# Patient Record
Sex: Male | Born: 1970
Health system: Southern US, Community
[De-identification: ages and names within clinical notes are randomized; demographics above are authoritative.]

## PROBLEM LIST (undated history)

## (undated) ENCOUNTER — Ambulatory Visit: Admission: EM | Source: Home / Self Care

## (undated) DIAGNOSIS — R9431 Abnormal electrocardiogram [ECG] [EKG]: Secondary | ICD-10-CM

## (undated) DIAGNOSIS — R739 Hyperglycemia, unspecified: Secondary | ICD-10-CM

## (undated) DIAGNOSIS — K589 Irritable bowel syndrome without diarrhea: Secondary | ICD-10-CM

## (undated) DIAGNOSIS — E669 Obesity, unspecified: Secondary | ICD-10-CM

## (undated) DIAGNOSIS — G4733 Obstructive sleep apnea (adult) (pediatric): Secondary | ICD-10-CM

## (undated) DIAGNOSIS — K219 Gastro-esophageal reflux disease without esophagitis: Secondary | ICD-10-CM

## (undated) DIAGNOSIS — E119 Type 2 diabetes mellitus without complications: Secondary | ICD-10-CM

## (undated) DIAGNOSIS — E860 Dehydration: Secondary | ICD-10-CM

## (undated) DIAGNOSIS — E785 Hyperlipidemia, unspecified: Secondary | ICD-10-CM

## (undated) DIAGNOSIS — F419 Anxiety disorder, unspecified: Secondary | ICD-10-CM

## (undated) HISTORY — PX: WISDOM TOOTH EXTRACTION: SHX21

## (undated) HISTORY — DX: Gastro-esophageal reflux disease without esophagitis: K21.9

## (undated) HISTORY — PX: TONSILLECTOMY AND ADENOIDECTOMY: SUR1326

## (undated) HISTORY — DX: Dehydration: E86.0

## (undated) HISTORY — DX: Hyperlipidemia, unspecified: E78.5

## (undated) HISTORY — DX: Hyperglycemia, unspecified: R73.9

## (undated) HISTORY — DX: Obstructive sleep apnea (adult) (pediatric): G47.33

## (undated) HISTORY — PX: COLONOSCOPY: SHX174

## (undated) HISTORY — DX: Anxiety disorder, unspecified: F41.9

## (undated) HISTORY — DX: Abnormal electrocardiogram (ECG) (EKG): R94.31

## (undated) HISTORY — DX: Obesity, unspecified: E66.9

## (undated) HISTORY — DX: Type 2 diabetes mellitus without complications: E11.9

## (undated) HISTORY — DX: Irritable bowel syndrome, unspecified: K58.9

---

## 2002-01-29 HISTORY — PX: UVULOPALATOPHARYNGOPLASTY: SHX827

## 2002-05-24 ENCOUNTER — Ambulatory Visit (HOSPITAL_BASED_OUTPATIENT_CLINIC_OR_DEPARTMENT_OTHER): Admission: RE | Admit: 2002-05-24 | Discharge: 2002-05-24 | Payer: Self-pay | Admitting: Pulmonary Disease

## 2002-07-01 ENCOUNTER — Ambulatory Visit (HOSPITAL_COMMUNITY): Admission: RE | Admit: 2002-07-01 | Discharge: 2002-07-02 | Payer: Self-pay | Admitting: Otolaryngology

## 2002-07-01 ENCOUNTER — Encounter (INDEPENDENT_AMBULATORY_CARE_PROVIDER_SITE_OTHER): Payer: Self-pay | Admitting: *Deleted

## 2002-07-06 ENCOUNTER — Observation Stay (HOSPITAL_COMMUNITY): Admission: EM | Admit: 2002-07-06 | Discharge: 2002-07-07 | Payer: Self-pay | Admitting: Emergency Medicine

## 2003-01-30 DIAGNOSIS — E860 Dehydration: Secondary | ICD-10-CM

## 2003-01-30 HISTORY — DX: Dehydration: E86.0

## 2004-06-06 ENCOUNTER — Ambulatory Visit: Payer: Self-pay | Admitting: Family Medicine

## 2004-07-13 ENCOUNTER — Ambulatory Visit: Payer: Self-pay | Admitting: Internal Medicine

## 2004-10-10 ENCOUNTER — Ambulatory Visit: Payer: Self-pay | Admitting: Internal Medicine

## 2004-10-11 ENCOUNTER — Ambulatory Visit: Payer: Self-pay | Admitting: Internal Medicine

## 2005-01-03 ENCOUNTER — Encounter: Admission: RE | Admit: 2005-01-03 | Discharge: 2005-04-03 | Payer: Self-pay | Admitting: Internal Medicine

## 2005-08-24 ENCOUNTER — Ambulatory Visit: Payer: Self-pay | Admitting: Internal Medicine

## 2005-11-18 ENCOUNTER — Emergency Department (HOSPITAL_COMMUNITY): Admission: EM | Admit: 2005-11-18 | Discharge: 2005-11-18 | Payer: Self-pay | Admitting: Emergency Medicine

## 2005-11-23 ENCOUNTER — Ambulatory Visit: Payer: Self-pay | Admitting: Internal Medicine

## 2006-01-31 ENCOUNTER — Ambulatory Visit: Payer: Self-pay | Admitting: Internal Medicine

## 2006-06-04 ENCOUNTER — Ambulatory Visit: Payer: Self-pay | Admitting: Internal Medicine

## 2007-10-02 ENCOUNTER — Ambulatory Visit: Payer: Self-pay | Admitting: Internal Medicine

## 2007-10-07 ENCOUNTER — Ambulatory Visit: Payer: Self-pay | Admitting: Internal Medicine

## 2007-11-04 ENCOUNTER — Ambulatory Visit: Payer: Self-pay | Admitting: Internal Medicine

## 2008-06-07 ENCOUNTER — Ambulatory Visit: Payer: Self-pay | Admitting: Internal Medicine

## 2009-05-23 ENCOUNTER — Telehealth (INDEPENDENT_AMBULATORY_CARE_PROVIDER_SITE_OTHER): Payer: Self-pay | Admitting: *Deleted

## 2009-05-24 ENCOUNTER — Ambulatory Visit: Payer: Self-pay | Admitting: Internal Medicine

## 2009-05-24 DIAGNOSIS — J309 Allergic rhinitis, unspecified: Secondary | ICD-10-CM | POA: Insufficient documentation

## 2009-05-24 LAB — CONVERTED CEMR LAB: Rapid Strep: NEGATIVE

## 2009-07-19 ENCOUNTER — Ambulatory Visit: Payer: Self-pay | Admitting: Internal Medicine

## 2009-08-25 ENCOUNTER — Ambulatory Visit: Payer: Self-pay | Admitting: Internal Medicine

## 2009-08-25 DIAGNOSIS — F411 Generalized anxiety disorder: Secondary | ICD-10-CM | POA: Insufficient documentation

## 2009-08-25 DIAGNOSIS — G4733 Obstructive sleep apnea (adult) (pediatric): Secondary | ICD-10-CM | POA: Insufficient documentation

## 2009-08-25 DIAGNOSIS — E785 Hyperlipidemia, unspecified: Secondary | ICD-10-CM | POA: Insufficient documentation

## 2009-09-23 ENCOUNTER — Telehealth (INDEPENDENT_AMBULATORY_CARE_PROVIDER_SITE_OTHER): Payer: Self-pay | Admitting: *Deleted

## 2009-11-11 ENCOUNTER — Ambulatory Visit: Payer: Self-pay | Admitting: Internal Medicine

## 2009-12-05 ENCOUNTER — Ambulatory Visit: Payer: Self-pay | Admitting: Internal Medicine

## 2009-12-05 DIAGNOSIS — G479 Sleep disorder, unspecified: Secondary | ICD-10-CM | POA: Insufficient documentation

## 2010-02-26 LAB — CONVERTED CEMR LAB
ALT: 19 units/L (ref 0–53)
AST: 16 units/L (ref 0–37)
Albumin: 3.9 g/dL (ref 3.5–5.2)
Alkaline Phosphatase: 60 units/L (ref 39–117)
BUN: 13 mg/dL (ref 6–23)
Basophils Absolute: 0 10*3/uL (ref 0.0–0.1)
Basophils Relative: 0.4 % (ref 0.0–3.0)
Bilirubin, Direct: 0.1 mg/dL (ref 0.0–0.3)
CO2: 29 meq/L (ref 19–32)
Calcium: 8.5 mg/dL (ref 8.4–10.5)
Chloride: 99 meq/L (ref 96–112)
Cholesterol, target level: 200 mg/dL
Cholesterol: 192 mg/dL (ref 0–200)
Creatinine, Ser: 0.9 mg/dL (ref 0.4–1.5)
Direct LDL: 127.1 mg/dL
Eosinophils Absolute: 0.1 10*3/uL (ref 0.0–0.7)
Eosinophils Relative: 1.4 % (ref 0.0–5.0)
GFR calc non Af Amer: 123.23 mL/min (ref >60)
Glucose, Bld: 93 mg/dL (ref 70–99)
HCT: 43.1 % (ref 39.0–52.0)
HDL goal, serum: 40 mg/dL
HDL: 33.9 mg/dL — ABNORMAL LOW (ref >39.00)
Hemoglobin: 14.6 g/dL (ref 13.0–17.0)
LDL Goal: 160 mg/dL
Lymphocytes Relative: 25.7 % (ref 12.0–46.0)
Lymphs Abs: 1.9 10*3/uL (ref 0.7–4.0)
MCHC: 33.8 g/dL (ref 30.0–36.0)
MCV: 95.8 fL (ref 78.0–100.0)
Monocytes Absolute: 0.5 10*3/uL (ref 0.1–1.0)
Monocytes Relative: 6.4 % (ref 3.0–12.0)
Neutro Abs: 4.9 10*3/uL (ref 1.4–7.7)
Neutrophils Relative %: 66.1 % (ref 43.0–77.0)
Platelets: 240 10*3/uL (ref 150.0–400.0)
Potassium: 3.9 meq/L (ref 3.5–5.1)
RBC: 4.49 M/uL (ref 4.22–5.81)
RDW: 12.9 % (ref 11.5–14.6)
Sodium: 142 meq/L (ref 135–145)
TSH: 1.06 microintl units/mL (ref 0.35–5.50)
Total Bilirubin: 0.6 mg/dL (ref 0.3–1.2)
Total CHOL/HDL Ratio: 6
Total Protein: 7 g/dL (ref 6.0–8.3)
Triglycerides: 203 mg/dL — ABNORMAL HIGH (ref 0.0–149.0)
VLDL: 40.6 mg/dL — ABNORMAL HIGH (ref 0.0–40.0)
WBC: 7.5 10*3/uL (ref 4.5–10.5)

## 2010-03-02 NOTE — Progress Notes (Signed)
Summary: refill request  Phone Note Refill Request Call back at [phone]   Refills Requested: Medication #1:  EFFEXOR XR 150 MG XR24H-CAP one tablet by mouth once daily   Dosage confirmed as above?Dosage Confirmed   Supply Requested: 1 month   Last Refilled: [facility] [address] [place]  Next Appointment Scheduled: none Initial call taken by: Lavell Islam,  September 23, 2009 10:54 AM    Prescriptions: EFFEXOR XR 150 MG XR24H-CAP (VENLAFAXINE HCL) one tablet by mouth once daily  #30 x 11   Entered by:   Shonna Chock CMA   Authorized by:   Marga Melnick MD   Signed by:   Shonna Chock CMA on 09/23/2009   Method used:   Elect[facility]ically to        [facility]  [place] [facility]ail[facility]       [address]       [place], [place]  [address]       Ph: [phone] or [phone]       Fax: [phone]   RxID:   [id number]

## 2010-03-02 NOTE — Assessment & Plan Note (Signed)
Summary: CPX/LABS/CBS   Vital Signs:  Patient profile:   40 year old male Height:      68 inches Weight:      247.4 pounds Temp:     98.6 degrees F oral Pulse rate:   72 / minute Resp:     14 per minute BP sitting:   104 / 66  (left arm) Cuff size:   large  Vitals Entered By: Shonna Chock CMA (August 25, 2009 9:56 AM)  CC: CPX with fasting labs , General Medical Evaluation, Lipid Management   CC:  CPX with fasting labs , General Medical Evaluation, and Lipid Management.  History of Present Illness: Gregory Owens is here for a physical; he is asymptomatic.  Lipid Management History:      Positive NCEP/ATP III risk factors include family history for ischemic heart disease (females less than 56 years old).  Negative NCEP/ATP III risk factors include male age less than 60 years old, non-diabetic, non-tobacco-user status, non-hypertensive, no ASHD (atherosclerotic heart disease), no prior stroke/TIA, no peripheral vascular disease, and no history of aortic aneurysm.     Preventive Screening-Counseling & Management  Alcohol-Tobacco     Smoking Status: never  Caffeine-Diet-Exercise     Does Patient Exercise: no  Current Medications (verified): 1)  Effexor Xr 150 Mg Xr24h-Cap (Venlafaxine Hcl) .... One Tablet By Mouth Once Daily 2)  Zyrtec Hives Relief 10 Mg Tabs (Cetirizine Hcl) .[name].. 1 By Mouth Once Daily As Needed 3)  Fluticasone Propionate 50 Mcg/act Susp (Fluticasone Propionate) .[name].. 1 Spray Two Times A Day As Needed After Neti Pot  Allergies (verified): No Known Drug Allergies  Past History:  Past Medical History: IBS; NS ST-T EKG changes with negative Stress Cardiolite 2003; Anxiety Hyperlipidemia/ Hypertriglyceridemia ([place] Study LDL goal = < 160) ; Fasting Hyperglycemia Dehydration 2005 , [place] , [place]; Chest Pain , negative ER evaluation 2007  Past Surgical History: Tonsillectomy & Uvulectomy for Obstructive Sleep Apnea; Post op bleeding complication  Family  History: Father: HTN, dyslipidemia Mother: HTN , lipids, Inflammatory Occipital  Lymphadenitis Siblings: bro: Autoimmune  Hepatitis ; PGM : DM, CVA, MI ? pre 89; MGM : DM, MI pre 37  Social History: Occupation: Production designer, theatre/television/film Married Never Smoked Alcohol use-no Regular exercise-no Smoking Status:  never Does Patient Exercise:  no  Review of Systems General:  Denies chills, fever, sweats, and weight loss. Eyes:  Denies blurring, double vision, and vision loss-both eyes. ENT:  Denies decreased hearing, difficulty swallowing, and hoarseness. CV:  Denies chest pain or discomfort, leg cramps with exertion, palpitations, shortness of breath with exertion, swelling of feet, and swelling of hands. Resp:  Denies cough, excessive snoring, hypersomnolence, morning headaches, and sputum productive. GI:  Denies abdominal pain, bloody stools, constipation, dark tarry stools, diarrhea, and indigestion. GU:  Denies discharge, dysuria, and hematuria; Some urge incontinence. MS:  Denies joint pain, joint redness, joint swelling, low back pain, mid back pain, and thoracic pain. Derm:  Denies changes in nail beds, dryness, hair loss, and lesion(s). Neuro:  Denies disturbances in coordination, headaches, numbness, poor balance, and tingling. Psych:  Complains of anxiety; denies depression, easily angered, easily tearful, and irritability; Concerned about  his twin's (Kevin's) health issues. Endo:  Denies cold intolerance, excessive hunger, excessive thirst, excessive urination, and heat intolerance. Heme:  Denies abnormal bruising and bleeding. Allergy:  Complains of itching eyes, seasonal allergies, and sneezing; Fluticazone, Zyrtec  & Neti pot as needed .  Physical Exam  General:  well-nourished; alert,appropriate and cooperative throughout examination  Head:  Normocephalic and atraumatic without obvious abnormalities. No apparent alopecia . Eyes:  No corneal or conjunctival inflammation noted. Perrla.  Funduscopic exam benign, without hemorrhages, exudates or papilledema.  Ears:  External ear exam shows no significant lesions or deformities.  Otoscopic examination reveals clear canals, tympanic membranes are intact bilaterally without bulging, retraction, inflammation or discharge. Hearing is grossly normal bilaterally. Nose:  External nasal examination shows no deformity or inflammation. Nasal mucosa are pink and moist without lesions or exudates. Mouth:  Oral mucosa and oropharynx without lesions or exudates.  Teeth in good repair. S/P uvulectomy Neck:  No deformities, masses, or tenderness noted. Breasts:  Prominent breast tissue Lungs:  Normal respiratory effort, chest expands symmetrically. Lungs are clear to auscultation, no crackles or wheezes. Heart:  Normal rate and regular rhythm. S1 and S2 normal without gallop, murmur, click, rub or other extra sounds. Abdomen:  Bowel sounds positive,abdomen soft and non-tender without masses, organomegaly or hernias noted. Rectal:  No external abnormalities noted. Normal sphincter tone. No rectal masses or tenderness. Genitalia:  Testes bilaterally descended without nodularity, tenderness or masses. No scrotal masses or lesions. No penis lesions or urethral discharge. Prostate:  Prostate gland firm and smooth, no enlargement, nodularity, tenderness, mass, asymmetry or induration. Skin:  Intact without suspicious lesions or rashes Cervical Nodes:  No lymphadenopathy noted Axillary Nodes:  No palpable lymphadenopathy Inguinal Nodes:  No significant adenopathy Psych:  memory intact for recent and remote, normally interactive, good eye contact, not anxious appearing, and not depressed appearing.     Impression & Recommendations:  Problem # 1:  ROUTINE GENERAL MEDICAL [email or web address]  CARE FACL (ICD-V70.0)  Orders: EKG w/ Interpretation (93000) Venipuncture (16109) TLB-Lipid Panel (80061-LIPID) TLB-BMP (Basic Metabolic Panel-BMET)  (80048-METABOL) TLB-CBC Platelet - w/Differential (85025-CBCD) TLB-Hepatic/Liver Function Pnl (80076-HEPATIC) TLB-TSH (Thyroid Stimulating Hormone) (84443-TSH) T-HIV Antibody  (Reflex) ([id number]) T-RPR (Syphilis) (91478-29562) T- * Misc. Laboratory test [phone])  Problem # 2:  HYPERLIPIDEMIA (ICD-272.4)  Problem # 3:  SLEEP APNEA, OBSTRUCTIVE (ICD-327.23) resolved post upper airway surgery  Problem # 4:  SEXUALLY TRANSMITTED DISEASE, EXPOSURE TO (ICD-V01.6)  possible; testing requested  Orders: Venipuncture (57846) T-HIV Antibody  (Reflex) ([id number]) T-RPR (Syphilis) (24401-02725) T- * Misc. Laboratory test [phone])  Complete Medication List: 1)  Effexor Xr 150 Mg Xr24h-cap (Venlafaxine hcl) .... One tablet by mouth once daily 2)  Zyrtec Hives Relief 10 Mg Tabs (Cetirizine hcl) .[name].. 1 by mouth once daily as needed 3)  Fluticasone Propionate 50 Mcg/act Susp (Fluticasone propionate) .[name].. 1 spray two times a day as needed after neti pot  Lipid Assessment/Plan:      Based on NCEP/ATP III, the patient's risk factor category is "0-1 risk factors".  The patient's lipid goals are as follows: Total cholesterol goal is 200; LDL cholesterol goal is 160; HDL cholesterol goal is 40; Triglyceride goal is 150.    Patient Instructions: 1)  Take edited record to all MD appts  Appended Document: CPX/LABS/CBS

## 2010-03-02 NOTE — Assessment & Plan Note (Signed)
Summary: sore throat//fd   Vital Signs:  Patient profile:   40 year old male Weight:      259.8 pounds Temp:     98.8 degrees F oral Pulse rate:   84 / minute Resp:     14 per minute BP sitting:   128 / 80  (left arm) Cuff size:   large  Vitals Entered By: Shonna Chock (May 24, 2009 10:28 AM) CC: Sore throat and sinuses since Thursday Comments REVIEWED MED LIST, PATIENT AGREED DOSE AND INSTRUCTION CORRECT    CC:  Sore throat and sinuses since Thursday.  History of Present Illness: Gregory Owens has had a ST X 5 days; Rx: Chloraseptic spray & NSAIDS with some benefit.He takes Zyrtec once daily chronically.Symptoms began after mowing lawn.  Allergies (verified): No Known Drug Allergies  Review of Systems General:  Denies chills, fever, and sweats. ENT:  Complains of nasal congestion and sinus pressure; denies postnasal drainage; No frontal headache, facial pain or purulence. Resp:  Complains of cough; denies shortness of breath, sputum productive, and wheezing. Allergy:  Complains of sneezing; denies itching eyes; Watery eyes.  Physical Exam  General:  in no acute distress; alert,appropriate and cooperative throughout examination Ears:  External ear exam shows no significant lesions or deformities.  Otoscopic examination reveals clear canals, tympanic membranes are intact bilaterally without bulging, retraction, inflammation or discharge. Hearing is grossly normal bilaterally. Nose:  External nasal examination shows no deformity or inflammation. R nare occluded by classic allergic changes with edema & refraction of light. Profound hyponasal speech Mouth:  Oral mucosa and oropharynx without lesions or exudates.  S/P uvulectomy Lungs:  Normal respiratory effort, chest expands symmetrically. Lungs are clear to auscultation, no crackles or wheezes. Cervical Nodes:  No lymphadenopathy noted Axillary Nodes:  No palpable lymphadenopathy   Impression & Recommendations:  Problem # 1:   PHARYNGITIS-ACUTE (ICD-462)  Problem # 2:  RHINITIS (ICD-477.9)  His updated medication list for this problem includes:    Zyrtec Hives Relief 10 Mg Tabs (Cetirizine hcl) .[name]... 1 by mouth once daily as needed    Fluticasone Propionate 50 Mcg/act Susp (Fluticasone propionate) .[name]... 1 spray two times a day after neti pot  Complete Medication List: 1)  Effexor Xr 75 Mg Xr24h-cap (Venlafaxine hcl) .[name].. 1 by mouth once daily 2)  Zyrtec Hives Relief 10 Mg Tabs (Cetirizine hcl) .[name].. 1 by mouth once daily as needed 3)  Fluticasone Propionate 50 Mcg/act Susp (Fluticasone propionate) .[name].. 1 spray two times a day after neti pot 4)  Singulair 10 Mg Tabs (Montelukast sodium) .[name].. 1 once daily  Other Orders: Rapid Strep ([address])  Patient Instructions: 1)  Neti pot two times a day followed by nasal spray  until sinuses are clear . Zyrtec at bedtime as needed . Singulair each am. Prescriptions: SINGULAIR 10 MG TABS (MONTELUKAST SODIUM) 1 once daily  #14 x 0   Entered and Authorized by:   Marga Melnick MD   Signed by:   Marga Melnick MD on 05/24/2009   Method used:   Print then Give to Patient   RxID:   [id number] FLUTICASONE PROPIONATE 50 MCG/ACT SUSP (FLUTICASONE PROPIONATE) 1 spray two times a day after Neti pot  #1 x 11   Entered and Authorized by:   Marga Melnick MD   Signed by:   Marga Melnick MD on 05/24/2009   Method used:   Print then Give to Patient   RxID:   [id number]   Laboratory Results    Other  Tests  Rapid Strep: negative

## 2010-03-02 NOTE — Assessment & Plan Note (Signed)
Summary: change med/cbs   Vital Signs:  Patient profile:   40 year old male Weight:      247.8 pounds BMI:     37.81 Pulse rate:   84 / minute Resp:     15 per minute BP sitting:   116 / 78  (left arm) Cuff size:   large  Vitals Entered By: Shonna Chock CMA (December 05, 2009 4:38 PM) CC: Discuss changing Effexor- not effective, Insomnia   CC:  Discuss changing Effexor- not effective and Insomnia.  History of Present Illness: Effexor XR 150 has been effective , but now he is having anxiety , especially in am. His anxiety is mainly related to concerns over his brother's health. He is involved in a private mental health clinic. Sleep is suboptimal.  The patient reports difficulty falling asleep,occasional nightmares, and occasional  leg movements, but denies frequent awakening, early awakening, snoring, and apnea noted by partner.  Behaviors that may contribute to insomnia include watching TV in bed and daytime naps.  PMH of ENT surgery for OSA.  Allergies (verified): No Known Drug Allergies  Review of Systems General:  Complains of sleep disorder; He takes daytime naps.. Psych:  Complains of anxiety; denies depression, easily angered, easily tearful, and irritability.  Physical Exam  General:  in no acute distress; alert,appropriate and cooperative throughout examination Eyes:  No corneal or conjunctival inflammation noted.Minimal lid lag Mouth:  Oral mucosa and oropharynx without lesions or exudates.  Teeth in good repair. S/P uvuloplasty Neck:  No deformities, masses, or tenderness noted. Neurologic:  alert & oriented X3 and DTRs symmetrical and normal.  No tremor Skin:  Intact without suspicious lesions or rashes Psych:  memory intact for recent and remote, normally interactive, and good eye contact.     Impression & Recommendations:  Problem # 1:  SLEEP DISORDER (ICD-780.50)  Problem # 2:  ANXIETY (ICD-300.00)  His updated medication list for this problem includes:   Effexor Xr 150 Mg Xr24h-cap (Venlafaxine hcl) ..... One tablet by mouth once daily    Clonazepam 0.5 Mg Tabs (Clonazepam) .[name]... 1 at bedtime as needed  Complete Medication List: 1)  Effexor Xr 150 Mg Xr24h-cap (Venlafaxine hcl) .... One tablet by mouth once daily 2)  Zyrtec Hives Relief 10 Mg Tabs (Cetirizine hcl) .[name].. 1 by mouth once daily as needed 3)  Fluticasone Propionate 50 Mcg/act Susp (Fluticasone propionate) .[name].. 1 spray two times a day as needed after neti pot 4)  Clonazepam 0.5 Mg Tabs (Clonazepam) .[name].. 1 at bedtime as needed  Patient Instructions: 1)  Avoid stimulants as discussed, especially after evening meal. 2)  It is important that you exercise regularly at least 20 minutes 5 times a week. If you develop chest pain, have severe difficulty breathing, or feel very tired , stop exercising immediately and seek medical attention. Sleep Hygiene as discussed. Prescriptions: CLONAZEPAM 0.5 MG TABS (CLONAZEPAM) 1 at bedtime as needed  #30 x 2   Entered and Authorized by:   Marga Melnick MD   Signed by:   Marga Melnick MD on 12/05/2009   Method used:   Print then Give to Patient   RxID:   [id number]    Orders Added: 1)  Est. Patient Level III [14782]

## 2010-03-02 NOTE — Progress Notes (Signed)
Summary: sore throat  Phone Note Call from Patient Call back at Work Phone [phone]   Caller: Patient Summary of Call: PT left VM stating that after he did some yard work he has been bother with a sore throat for the pass 4 days.  pt states that it may be allergy related. pt would like to know if it would be possible to rx a med for him. left pt detail message that OV will be need in order for med to be rx and to r/o any other possible infection that may not be allergy related................Marland KitchenFelecia Deloach CMA  May 23, 2009 3:33 PM

## 2010-03-02 NOTE — Assessment & Plan Note (Signed)
Summary: lump on back of head//kn   Vital Signs:  Patient profile:   40 year old male Height:      67 inches Weight:      253 pounds BMI:     39.77 BSA:     2.24 Pulse rate:   88 / minute Pulse rhythm:   regular Resp:     15 per minute BP sitting:   124 / 76  (left arm) Cuff size:   large  Vitals Entered By: Ok Anis CMA (July 19, 2009 10:34 AM)  CC:  Bump on back of head left side .  History of Present Illness: Soreness in in L occipital subcutaneous tissue since sat 07/16/2009. Rx: NSAIDS with partial response. Root canal L maxilla 2 weeks ago.  Current Medications (verified): 1)  Effexor Xr 150 Mg Xr24h-Cap (Venlafaxine Hcl) .... One Tablet By Mouth Once Daily 2)  Zyrtec Hives Relief 10 Mg Tabs (Cetirizine Hcl) .[name].. 1 By Mouth Once Daily As Needed 3)  Fluticasone Propionate 50 Mcg/act Susp (Fluticasone Propionate) .[name].. 1 Spray Two Times A Day After Neti Pot 4)  Singulair 10 Mg Tabs (Montelukast Sodium) .[name].. 1 Once Daily  Allergies (verified): No Known Drug Allergies  Review of Systems General:  Denies chills, fever, and weight loss. ENT:  Denies ear discharge and earache. Derm:  Denies changes in color of skin. Heme:  Denies enlarge lymph nodes.  Physical Exam  General:  in no acute distress; alert,appropriate and cooperative throughout examination Ears:  External ear exam shows no significant lesions or deformities.  Otoscopic examination reveals clear canals, tympanic membranes are intact bilaterally without bulging, retraction, inflammation or discharge. Hearing is grossly normal bilaterally. Nose:  External nasal examination shows no deformity or inflammation. Nasal mucosa : mild eythema R nare without lesions or exudates. Mouth:  Oral mucosa and oropharynx without lesions or exudates.  Teeth in good repair; no evidence of inflammation . Skin:  1X1 cm  tender area subcutaneously  L occiput w/o skin color or temp change  Cervical Nodes:  No lymphadenopathy  noted Axillary Nodes:  No palpable lymphadenopathy   Impression & Recommendations:  Problem # 1:  CELLULITIS (ICD-682.9)  scalp  His updated medication list for this problem includes:    Cephalexin 500 Mg Caps (Cephalexin) .[name]... 1 two times a day  Complete Medication List: 1)  Effexor Xr 150 Mg Xr24h-cap (Venlafaxine hcl) .... One tablet by mouth once daily 2)  Zyrtec Hives Relief 10 Mg Tabs (Cetirizine hcl) .[name].. 1 by mouth once daily as needed 3)  Fluticasone Propionate 50 Mcg/act Susp (Fluticasone propionate) .[name].. 1 spray two times a day after neti pot 4)  Singulair 10 Mg Tabs (Montelukast sodium) .[name].. 1 once daily 5)  Cephalexin 500 Mg Caps (Cephalexin) .[name].. 1 two times a day  Patient Instructions: 1)  Warm compresses three times a day as needed .T -Gel shampoo every other day until scalp lesion resolves. Prescriptions: CEPHALEXIN 500 MG CAPS (CEPHALEXIN) 1 two times a day  #14 x 0   Entered and Authorized by:   Marga Melnick MD   Signed by:   Marga Melnick MD on 07/19/2009   Method used:   [facility]ed to ...       [facility]  [place] [facility]       [address]       [place], [place]  [address]       Ph: [phone] or [phone]       Fax: [phone]  RxID:   [id number]

## 2010-03-02 NOTE — Assessment & Plan Note (Signed)
Summary: FLU SHOT/KN  Nurse Visit   Allergies: No Known Drug Allergies  Orders Added: 1)  Admin 1st Vaccine [90471] 2)  Flu Vaccine 69yrs + [14782] Flu Vaccine Consent Questions     Do you have a history of severe allergic reactions to this vaccine? no    Any prior history of allergic reactions to egg and/or gelatin? no    Do you have a sensitivity to the preservative Thimersol? no    Do you have a past history of Guillan-Barre Syndrome? no    Do you currently have an acute febrile illness? no    Have you ever had a severe reaction to latex? no    Vaccine information given and explained to patient? yes    Are you currently pregnant? no    Lot Number:[id number]   Exp Date:07/29/2010   Site Given  Right Deltoid IM .lbflu1

## 2010-04-18 ENCOUNTER — Telehealth: Payer: Self-pay | Admitting: Internal Medicine

## 2010-04-18 DIAGNOSIS — Z202 Contact with and (suspected) exposure to infections with a predominantly sexual mode of transmission: Secondary | ICD-10-CM | POA: Insufficient documentation

## 2010-04-18 NOTE — Telephone Encounter (Signed)
Dr.Hopper please advise on STD testing for patient.

## 2010-04-20 ENCOUNTER — Other Ambulatory Visit: Payer: BC Managed Care – PPO

## 2010-04-20 DIAGNOSIS — Z202 Contact with and (suspected) exposure to infections with a predominantly sexual mode of transmission: Secondary | ICD-10-CM

## 2010-04-20 DIAGNOSIS — Z0000 Encounter for general adult medical examination without abnormal findings: Secondary | ICD-10-CM

## 2010-04-21 LAB — RPR

## 2010-04-21 LAB — HIV-1 RNA QUANT-NO REFLEX-BLD

## 2010-04-24 ENCOUNTER — Telehealth: Payer: Self-pay | Admitting: Internal Medicine

## 2010-04-24 LAB — HSV 2 ANTIBODY, IGG: HSV 2 Glycoprotein G Ab, IgG: 0.11 IV

## 2010-04-24 NOTE — Telephone Encounter (Signed)
Patient aware all STD testing is neg, patient requested copy to be faxed to [phone]

## 2010-04-24 NOTE — Telephone Encounter (Signed)
Called to see if results of latest tests were in

## 2010-04-25 ENCOUNTER — Telehealth: Payer: Self-pay | Admitting: Internal Medicine

## 2010-04-25 LAB — HIV ANTIBODY (ROUTINE TESTING W REFLEX): HIV: NONREACTIVE

## 2010-04-25 NOTE — Telephone Encounter (Signed)
I informed patient all labs that are back was neg, I faxed him a copy of report and underlined the part stating HSV pending. Patient aware I will call him with HSV results when they become available

## 2010-04-25 NOTE — Telephone Encounter (Signed)
Pt states that he spoke w/ Gregory Owens yesterday about STD Lab results. Pt says he was told that all results came back negative but he sees where the HSV2-Pending. Pt is confused because he thought all results were back. Please advise.

## 2010-04-26 ENCOUNTER — Telehealth: Payer: Self-pay

## 2010-04-26 NOTE — Telephone Encounter (Signed)
Additional lab results

## 2010-06-16 NOTE — Op Note (Signed)
NAME:  Gregory Owens, Gregory Owens                    ACCOUNT NO.:  [id number]   MEDICAL RECORD NO.:  [id number]                   PATIENT TYPE:  OIB   LOCATION:  NA                                   FACILITY:  [facility]   PHYSICIAN:  Suzanna Obey, M.D.                    DATE OF BIRTH:  01-25-1971   DATE OF PROCEDURE:  07/01/2002  DATE OF DISCHARGE:                                 OPERATIVE REPORT   PREOPERATIVE DIAGNOSIS:  Obstructive sleep apnea, deviated septum, and  turbinate hypertrophy.   POSTOPERATIVE DIAGNOSIS:  Obstructive sleep apnea, deviated septum, and  turbinate hypertrophy.   PROCEDURE:  Septoplasty, submucous resection of inferior turbinates,  uvulopharyngeal palatoplasty and tonsillectomy.   ANESTHESIA:  General endotracheal tube.   ESTIMATED BLOOD LOSS:  Approximately 50 mL.   INDICATIONS FOR PROCEDURE:  This is a 40 year old who has had significant  obstructive sleep apnea and has failed CPAP.  He has significant anatomy  problems that surgery would be amenable to improvement of the obstructive  sleep apnea.  He was informed of the risks and benefits of the procedure  including bleeding, infection, perforation, change in the external  appearance of his nose, chronic crusting, and drying, numbness of his teeth,  uvulopharyngeal insufficiency, change of the voice, and risk of the  anesthetic. All questions were answered and consent was obtained.   DESCRIPTION OF PROCEDURE:  The patient was taken to the operating room and  placed in the supine position after adequate general endotracheal tube  anesthesia, was placed in the supine position and prepped and draped in the  usual sterile fashion.  The oxymetazoline pledgets were placed into the nose  bilaterally and then the septum and inferior turbinates were injected with  1% lidocaine with 1:100,000 epinephrine.  A left hemitransfixion incision  was performed raising the mucoperichondrial and osteal flap. The cautery was  divided about 2 cm posterior to the caudal strut and the deviated portion of  the cartilage was removed with a Therapist, nutritional. The inferior spur, both  cartilaginous and bone were removed with a Therapist, nutritional. The [facility] forceps were used to remove the deviation portion of the bone as  well as the spur.  This corrected the septal deflection.  The turbinates  were infractured.  Midline incision made with a 15 blade.  The mucosal flap  was elevated superiorly.  Inferior mucosa and bone were removed with the  turbinate scissors and the edge was cauterized with suction cautery and both  turbinates were outfractured.  Hemitransfixion incision closed with  interrupted 4-0 chromic and 4-0 plain gut suture placed through the septum.  The Kelpha roll soaked in bacitracin and placed into the nose bilaterally  and secured with 3-0 nylon.  Attention was then directed toward the mouth  with a Crowe-Davis mouth, inserted, retracted, and suspended from the Mayo  stand.  The patient had massive tonsils and thick,  long uvula and dissection  was begun at the uvula with dissection carried into the left tonsillar fossa  and the tonsil was removed with electrocautery dissection.  The dissection  was then carried to the right side and again tonsil removed along the  capsule with electrocautery dissection.  This was a significant amount of  tissue and once removed en block with the uvula and both tonsils attached,  there was a profound amount more room in the airway.  The hemostasis was  achieved with suction cautery. The palate and tonsillar fossa was closed  with interrupted 3-0 Vicryl.  The Crowe-Davis was released and  resuspended.  There were hemostasis present in all locations.  The  hypopharynx, esophagus, and stomach were suctioned with NG tube. The patient  was awakened and brought to the recovery room in stable condition.  Needle,  sponge, and instrument count correct.                                                Suzanna Obey, M.D.    Cordelia Pen  D:  07/01/2002  T:  07/01/2002  Job:  [id number]   cc:   [name]. [name], M.D. [facility]

## 2010-06-16 NOTE — Op Note (Signed)
NAMEmuzammil, bruins NO.:  [id number]   MEDICAL RECORD NO.:  [id number]                  PATIENT TYPE:   LOCATION:                                       FACILITY:  [facility]   PHYSICIAN:  Suzanna Obey, M.D.                    DATE OF BIRTH:  1970/02/16   DATE OF PROCEDURE:  07/07/2002  DATE OF DISCHARGE:                                 OPERATIVE REPORT   PREOPERATIVE DIAGNOSIS:  Tonsil hemorrhage.   POSTOPERATIVE DIAGNOSIS:  Tonsil hemorrhage.   OPERATION PERFORMED:  Control and cauterization of right tonsil hemorrhage.   SURGEON:  Suzanna Obey, M.D.   ANESTHESIA:  General endotracheal tube.   ESTIMATED BLOOD LOSS:  Less than 5mL.   INDICATIONS FOR PROCEDURE:  This is a 40 year old who has had a previous  uvulopalatopharyngoplasty and tonsillectomy approximately six days ago.  He  came in last night with some bleeding but it stopped, so he was admitted for  observation.  He then bled this morning with a significant amount of clots  and blood coming from his oropharynx.  It appeared there were clots on the  right tonsil area.  He was informed of the risks and benefits of the  procedure including bleeding, infection, continued persistent bleeding,  possibly requiring neck procedure and risks of the anesthetic.  All  questions were answered and consent was obtained.   DESCRIPTION OF PROCEDURE:  The patient was taken to the operating room and  placed in supine position after adequate general endotracheal tube  anesthesia, he was placed in the Rose position and draped in the usual  sterile manner.  The Crowe-Davis mouth gag was inserted, retracted and  suspended from the Mayo stand.  There was a clot in the right tonsil down in  the inferior aspect which was removed with the suction cautery.  There was  some bleeding that then started coming from a specific area along the  anterior edge of the tonsillar fossa at the base of tongue area.  This was  cauterized.  There was no arterial appearing bleeding that was pumping.  There were several other sites that were cauterized within the tonsillar  fossa that were oozing.  The left side was checked.  There was no evidence  of any bleeding. Irrigation of the oropharynx showed no further bleeding.  The Crowe-Davis was released and resuspended.  The patient was waking and  coughing on the tube and there still was no bleeding evident in any  location.  It was completely clear.  The hypopharynx, esophagus and stomach  were then suctioned with the nasogastric tube.  He did have a lot of old  appearing blood in the stomach.  The Crowe-Davis was removed.  The patient  was awakened and brought to recovery in stable condition.  Counts were  correct.  Suzanna Obey, M.D.    Cordelia Pen  D:  07/07/2002  T:  07/07/2002  Job:  [id number]

## 2010-06-16 NOTE — Assessment & Plan Note (Signed)
[facility]                        [facility] OFFICE NOTE   PAL, SHELL                 MRN:          [id number]  DATE:06/04/2006                            DOB:          04-07-1970    Gregory Owens was seen for a complete physical examination on Jun 04, 2006.  He  has no significant active symptoms.   PAST MEDICAL HISTORY:  1. Unchanged.  In 2004, he had a tonsillectomy and adenectomy and      uvulectomy.  This was complicated by postoperative bleeding which      required packing.  He also was hospitalized in 2005 with      dehydration.  2. He was evaluated in the emergency room in October 2007 for chest      pain.  3. He has a diagnosis of generalized anxiety disorder and previously      was seen by Dr. Meredith Owens, psychiatrist, who prescribed Effexor      XR 150.  He has been on this for 5 years, with excellent control of      anxiety.  He desires to stay on the branded product because of the      stability.  4. He also has a history of irritable bowel syndrome.  5. He did have nonspecific ST-T wave changes on EKG, with a negative      stress Cardiolite in August 2003.   FAMILY HISTORY:  Reveals hypertension and inflammatory lymphadenitis in  his mother.  Paternal grandmother and maternal grandmother had  myocardial infarction and diabetes.  The paternal grandmother had stroke  as well.   He has never smoked and does not drink.   He has no known drug allergies.   He is presently on Effexor XR 150, and Zyrtec 10 at bedtime as needed.   He has been commuting to [employer], but now doing work from home.  He is  on no diet and does not exercise.   REVIEW OF SYSTEMS:  Positive for occasional tinnitus on the left.   He has belching and dyspepsia and rare dysphagia.  He denies melena or  weight loss.   The remainder of the review of systems is negative.   PHYSICAL EXAMINATION:  VITAL SIGNS:  He is 5 feet 7 inches and has  gained approximately 3-1/2 pounds to 242.6 fully clothed, pulse is 52,  respiratory rate is 14, and blood pressure 120/82.  HEENT:  There is slight arteriolar narrowing on fundal exam.  Otic  canals, nares are patent, with no disease.  There is evidence of  previous uvulectomy.  Dental hygiene is excellent.  NECK:  Thyroid is substernal in location.  LYMPHATIC:  He has no lymphadenopathy about his head, neck, or axilla.  CARDIOVASCULAR:  No murmurs or gallops are present.  All pulses are  intact, and there is no edema.Pseudogynecomastia is suggested.  ABDOMEN:  Striae are noted over the abdomen Abdomen is protuberant..  He  has no organomegaly or masses.  GENITOURINARY:  Unremarkable.  RECTAL:  Prostate exam suboptimal due to body habitus.  Hemoccult  testing is negative.  EXTREMITIES:  He has mild crepitus  of the knees, but full range of  motion and normal strength.  NEUROPSYCHIATRIC:  Normal.   The triggers for reflux were discussed with Gregory Owens.  These would include  the aspirin family, alcohol, peppermint, tobacco, caffeine, coffee, tea,  cola, and chocolate.  He should not  eat or drinkfor 3 hours prior to  going to bed.  Because of the weight gain and his family history, A1c  and lipids will be collected.  It will be recommended that he follow a  low-carb, heart-healthy diet such as the Flat Belly Diet.  He should  avoid high-fructose corn syrup-containing foods and drinks and  hyperglycemic carbs which are typically the white carbohydrates.   Additional recommendations are pending the return of these labs.     Gregory Owens. Gregory Ren, MD,FACP,FCCP  Electronically Signed    [facility]  DD: 06/04/2006  DT: 06/04/2006  Job #: [id number]

## 2010-06-23 ENCOUNTER — Encounter: Payer: Self-pay | Admitting: Gastroenterology

## 2010-06-24 NOTE — Letter (Signed)
Jun 23, 2010   Mrs. Gregory Owens [facility] [address] [place], [place] [address]  RE:  [name], [name] MRN:  [id number]  /  DOB:  06-25-1970  Dear [name],  I was terribly saddened to hear that your husband passed away.  Please accept my sincerest condolences for you and your family.  If I can assist you in any manner in the future, do not hesitate to contact me.   Sincerely,     Barbette Hair. Arlyce Dice, MD,FACG   [facility]  DD: 06/23/2010  DT: 06/24/2010  Job #: [id number]

## 2010-06-30 ENCOUNTER — Encounter: Payer: Self-pay | Admitting: Family

## 2010-06-30 ENCOUNTER — Ambulatory Visit (INDEPENDENT_AMBULATORY_CARE_PROVIDER_SITE_OTHER): Payer: BC Managed Care – PPO | Admitting: Family

## 2010-06-30 DIAGNOSIS — H6691 Otitis media, unspecified, right ear: Secondary | ICD-10-CM

## 2010-06-30 DIAGNOSIS — H6690 Otitis media, unspecified, unspecified ear: Secondary | ICD-10-CM

## 2010-06-30 MED ORDER — AMOXICILLIN 500 MG PO CAPS: 1000.0000 mg | ORAL_CAPSULE | Freq: Three times a day (TID) | ORAL | Status: AC

## 2010-06-30 NOTE — Assessment & Plan Note (Signed)
Suspect patient's symptoms are related to a developing right otitis media. Will treat with amoxicillin, and recommended short-term use of NSAIDs such as Aleve or ibuprofen as needed for pain. The patient is instructed to contact us should his pain worsen or not improve in 48-72 hours.

## 2010-06-30 NOTE — Patient Instructions (Signed)
Call if symptoms worsen or do not improve.

## 2010-06-30 NOTE — Progress Notes (Signed)
  Subjective:    Patient ID: Gregory Owens, male    DOB: 03/11/1970, 40 y.o.   MRN: [id number]  HPI  Pt reports with complaint of right headache since Tuesday when he cleaned his ear with a Qtip.  Intermittent, moderate to severe when it occurs.  Denies hearing loss in the right ear.  Denies associated fever, nasal congestion or drainage from the ear.  Review of Systems See history of present illness  Past Medical History  Diagnosis Date  . IBS (irritable bowel syndrome)   . Abnormal EKG     NS ST-T EKG changes with negative Stress cardiolite 2003  . Anxiety   . Hyperlipidemia     Hypertriglyceridemia  ([place] Study LDL goal = < 160);   [name] Hyperglycemia     History   Social History  . Marital Status: Single    Spouse Name: N/A    Number of Children: N/A  . Years of Education: N/A   Occupational History  . Not on file.   Social History Main Topics  . Smoking status: Never Smoker   . Smokeless tobacco: Never Used  . Alcohol Use: No  . Drug Use: Not on file  . Sexually Active: Not on file   Other Topics Concern  . Not on file   Social History Narrative   Regular exercise: no    Past Surgical History  Procedure Date  . Uvulopalatopharyngoplasty     Post op bleeding complication    Family History  Problem Relation Age of Onset  . Hyperlipidemia Mother   . Hypertension Mother   . Other Mother     Inflammatory Occipital Lymphadenitis  . Hypertension Father   . Hyperlipidemia Father     dyslipidemia  . Diabetes Maternal Grandmother   . Heart attack Maternal Grandmother     pre 65  . Diabetes Paternal Grandmother   . Stroke Paternal Grandmother   . Heart disease Paternal Grandmother   . Heart attack Paternal Grandmother     pre 33    No Known Allergies  No current outpatient prescriptions on file prior to visit.    BP 116/78  Pulse 72  Temp(Src) 97.8 F (36.6 C) (Oral)  Resp 18  Wt 249 lb 0.6 oz (112.964 kg)       Objective:   Physical Exam    general: Pleasant male, awake, alert and in no acute distress Head: Normocephalic atraumatic, slight tenderness to palpation behind the right ear without erythema or swelling. ENT: Uvula is surgically absent tonsils surgically absent. Right tympanic membrane is pink in color with positive bulging, left tympanic membrane is normal Cardiovascular: S1, S2, regular rate and rhythm Respiratory: Breath sounds clear to auscultation bilaterally without wheezes rales or rhonchi    Assessment & Plan:

## 2010-07-05 ENCOUNTER — Ambulatory Visit: Payer: BC Managed Care – PPO | Admitting: Internal Medicine

## 2010-07-06 ENCOUNTER — Ambulatory Visit: Payer: BC Managed Care – PPO | Admitting: Internal Medicine

## 2010-07-06 DIAGNOSIS — Z0289 Encounter for other administrative examinations: Secondary | ICD-10-CM

## 2010-09-07 ENCOUNTER — Other Ambulatory Visit: Payer: Self-pay | Admitting: Internal Medicine

## 2010-10-13 ENCOUNTER — Encounter: Payer: BC Managed Care – PPO | Admitting: Internal Medicine

## 2010-12-07 ENCOUNTER — Other Ambulatory Visit: Payer: Self-pay | Admitting: Internal Medicine

## 2010-12-12 ENCOUNTER — Encounter: Payer: Self-pay | Admitting: Internal Medicine

## 2010-12-12 ENCOUNTER — Other Ambulatory Visit: Payer: Self-pay | Admitting: Internal Medicine

## 2010-12-12 ENCOUNTER — Ambulatory Visit (INDEPENDENT_AMBULATORY_CARE_PROVIDER_SITE_OTHER): Payer: BC Managed Care – PPO | Admitting: Internal Medicine

## 2010-12-12 DIAGNOSIS — E785 Hyperlipidemia, unspecified: Secondary | ICD-10-CM

## 2010-12-12 DIAGNOSIS — Z0000 Encounter for general adult medical examination without abnormal findings: Secondary | ICD-10-CM

## 2010-12-12 DIAGNOSIS — Z23 Encounter for immunization: Secondary | ICD-10-CM

## 2010-12-12 DIAGNOSIS — R9431 Abnormal electrocardiogram [ECG] [EKG]: Secondary | ICD-10-CM

## 2010-12-12 NOTE — Progress Notes (Signed)
Subjective:    Patient ID: Gregory Owens, male    DOB: 1970/09/22, 40 y.o.   MRN: [id number]  HPI  [name]  is here for a physical; he has no health acute issues except urgency.      Review of Systems He describes urgency one to 2 times per day without hematuria, pyuria, dysuria, hesitancy or incontinence.   He is concerned as he is not been able to lose weight. He doesn't exercise and is not on any dietary program.    Objective:   Physical Exam Gen.: Healthy and well-nourished in appearance. Alert, appropriate and cooperative throughout exam. Head: Normocephalic without obvious abnormalities  Eyes: No corneal or conjunctival inflammation noted. Pupils equal round reactive to light and accommodation. Fundal exam is benign without hemorrhages, exudate, papilledema. Extraocular motion intact. Vision grossly normal. Ears: External  ear exam reveals no significant lesions or deformities. Canals clear .TMs normal. Hearing is grossly normal bilaterally. Nose: External nasal exam reveals no deformity or inflammation. Nasal mucosa are pink and moist. No lesions or exudates noted.   Mouth: Oral mucosa and oropharynx reveal extensive palate/ uvular surgery. Teeth in good repair. Neck: No deformities, masses, or tenderness noted. Range of motion &  Thyroid normal. Lungs: Normal respiratory effort; chest expands symmetrically. Lungs are clear to auscultation without rales, wheezes, or increased work of breathing. Heart: Normal rate and rhythm. Normal S1 and S2. No gallop, click, or rub. S 4 w/o  murmur. Abdomen: Bowel sounds normal; abdomen soft and nontender. No masses, organomegaly or hernias noted. Genitalia/DRE : genital exam is unremarkable. Prostate is not enlarged; no nodules or induration are present                                                                                  Musculoskeletal/extremities: No deformity or scoliosis noted of  the thoracic or lumbar spine. No  clubbing, cyanosis, edema, or deformity noted. Range of motion  normal .Tone & strength  normal.Joints normal. Nail health  good. Vascular: Carotid, radial artery, dorsalis pedis and  posterior tibial pulses are full and equal. No bruits present. Neurologic: Alert and oriented x3. Deep tendon reflexes symmetrical and normal.          Skin: Intact without suspicious lesions or rashes.Striae over abdomen Lymph: No cervical, axillary, or inguinal lymphadenopathy present. Psych: Mood and affect are normal. Normally interactive                                                                                        Assessment & Plan:  #1 comprehensive physical exam; no acute findings #2 see Problem List with Assessments & Recommendations  #3 urgency one to 2 times a day  #4 nonspecific loss of the voltage in the lateral leads. This is in the context of a past history of nonspecific EKG  changes with a negative stress test in 2003. Cardiology reassessment is appropriate. Baseline labs will be collected fasting Plan: see Orders

## 2010-12-12 NOTE — Patient Instructions (Addendum)
Preventive Health Care: Exercise at least 30-45 minutes a day,  3-4 days a week.  Eat a low-fat diet with lots of fruits and vegetables, up to 7-9 servings per day. Your goal is waist measurement < 40 inches.Consume less than 40 grams of sugar per day from foods & drinks with High Fructose Corn Sugar as #1,2,3 or # 4 on label. Follow the low carb nutrition program in [employer]  to prevent Diabetes . White carbohydrates (potatoes, rice, bread, and pasta) have a high spike of sugar and a high load of sugar. For example a  baked potato has a cup of sugar and a  french fry  2 teaspoons of sugar. Yams, wild  rice, whole grained bread &  wheat pasta have been much lower spike and load of  sugar. Portions should be the size of a deck of cards or your palm.   Please  schedule fasting Labs : BMET,Lipids, hepatic panel, CBC & dif, TSH.  Please bring these instructions to that Lab appt.

## 2010-12-13 ENCOUNTER — Other Ambulatory Visit (INDEPENDENT_AMBULATORY_CARE_PROVIDER_SITE_OTHER): Payer: BC Managed Care – PPO

## 2010-12-13 DIAGNOSIS — Z0000 Encounter for general adult medical examination without abnormal findings: Secondary | ICD-10-CM

## 2010-12-13 LAB — HEPATIC FUNCTION PANEL
ALT: 21 U/L (ref 0–53)
AST: 22 U/L (ref 0–37)
Albumin: 3.6 g/dL (ref 3.5–5.2)
Alkaline Phosphatase: 58 U/L (ref 39–117)
Bilirubin, Direct: 0.1 mg/dL (ref 0.0–0.3)
Total Bilirubin: 0.8 mg/dL (ref 0.3–1.2)
Total Protein: 7.3 g/dL (ref 6.0–8.3)

## 2010-12-13 LAB — CBC WITH DIFFERENTIAL/PLATELET
Basophils Absolute: 0 10*3/uL (ref 0.0–0.1)
Basophils Relative: 0.2 % (ref 0.0–3.0)
Eosinophils Absolute: 0.2 10*3/uL (ref 0.0–0.7)
Eosinophils Relative: 2.4 % (ref 0.0–5.0)
HCT: 41.9 % (ref 39.0–52.0)
Hemoglobin: 14.1 g/dL (ref 13.0–17.0)
Lymphocytes Relative: 23.2 % (ref 12.0–46.0)
Lymphs Abs: 2 10*3/uL (ref 0.7–4.0)
MCHC: 33.6 g/dL (ref 30.0–36.0)
MCV: 94.3 fl (ref 78.0–100.0)
Monocytes Absolute: 0.5 10*3/uL (ref 0.1–1.0)
Monocytes Relative: 5.8 % (ref 3.0–12.0)
Neutro Abs: 5.9 10*3/uL (ref 1.4–7.7)
Neutrophils Relative %: 68.4 % (ref 43.0–77.0)
Platelets: 273 10*3/uL (ref 150.0–400.0)
RBC: 4.44 Mil/uL (ref 4.22–5.81)
RDW: 12.9 % (ref 11.5–14.6)
WBC: 8.6 10*3/uL (ref 4.5–10.5)

## 2010-12-13 LAB — BASIC METABOLIC PANEL
BUN: 14 mg/dL (ref 6–23)
CO2: 27 mEq/L (ref 19–32)
Calcium: 8.8 mg/dL (ref 8.4–10.5)
Chloride: 104 mEq/L (ref 96–112)
Creatinine, Ser: 0.9 mg/dL (ref 0.4–1.5)
GFR: 114.87 mL/min (ref >60.00)
Glucose, Bld: 113 mg/dL — ABNORMAL HIGH (ref 70–99)
Potassium: 3.7 mEq/L (ref 3.5–5.1)
Sodium: 139 mEq/L (ref 135–145)

## 2010-12-13 LAB — TSH: TSH: 1.75 u[IU]/mL (ref 0.35–5.50)

## 2010-12-13 LAB — LIPID PANEL
Cholesterol: 174 mg/dL (ref 0–200)
HDL: 33.7 mg/dL — ABNORMAL LOW (ref >39.00)
LDL Cholesterol: 113 mg/dL — ABNORMAL HIGH (ref 0–99)
Total CHOL/HDL Ratio: 5
Triglycerides: 138 mg/dL (ref 0.0–149.0)
VLDL: 27.6 mg/dL (ref 0.0–40.0)

## 2011-01-10 ENCOUNTER — Institutional Professional Consult (permissible substitution): Payer: BC Managed Care – PPO | Admitting: Cardiovascular Disease

## 2011-02-01 ENCOUNTER — Ambulatory Visit (INDEPENDENT_AMBULATORY_CARE_PROVIDER_SITE_OTHER): Payer: BC Managed Care – PPO | Admitting: Internal Medicine

## 2011-02-01 ENCOUNTER — Encounter: Payer: Self-pay | Admitting: Internal Medicine

## 2011-02-01 VITALS — BP 114/70 | HR 80 | Temp 98.7°F | Wt 263.6 lb

## 2011-02-01 DIAGNOSIS — R059 Cough, unspecified: Secondary | ICD-10-CM

## 2011-02-01 DIAGNOSIS — K219 Gastro-esophageal reflux disease without esophagitis: Secondary | ICD-10-CM

## 2011-02-01 DIAGNOSIS — R05 Cough: Secondary | ICD-10-CM

## 2011-02-01 MED ORDER — OMEPRAZOLE 20 MG PO CPDR: 20.0000 mg | DELAYED_RELEASE_CAPSULE | Freq: Every day | ORAL | Status: DC

## 2011-02-01 NOTE — Progress Notes (Signed)
  Subjective:    Patient ID: Gregory Owens, male    DOB: 05-Oct-1970, 41 y.o.   MRN: [id number]  HPI  COUGH Onset:1 month ago Trigger:no Course:stable Treatment/efficacy:Delsym w/o benefit Associated symptoms/signs:  URI symptoms: No facial pain, frontal headaches, nasal purulence, dental pain,sorethroat, or ear ache/otic discharge Extrinsic symptoms: No itchy eyes, sneezing, or angioedema Infectious symptoms: No fever, chills, sweats, and purulent secretions Chest symptoms: No pleuritic pain, sputum production, hemoptysis, dyspnea, or wheezing GI symptoms: Chronic  Dyspepsia; occasional dysphagia. Also  salty taste in mouth after Doxycycline for cellulitis of finger. The cough preceded the doxycycline prescription Occupational/environmental exposures:no Smoking history:never ACE inhibitor administration: no Past medical history/family history pulmonary disease:no   Review of Systems     Objective:   Physical Exam General appearance:well nourished; no acute distress or increased work of breathing is present.  No  lymphadenopathy about the head, neck, or axilla noted.   Eyes: No conjunctival inflammation or lid edema is present. There is no scleral icterus.  Ears:  External ear exam shows no significant lesions or deformities.  Otoscopic examination reveals wax in L canal  Nose:  External nasal examination shows no deformity or inflammation. Nasal mucosa are pink and moist without lesions or exudates. No septal dislocation or deviation.No obstruction to airflow.   Oral exam: Dental hygiene is good; lips and gums are healthy appearing.There is no oropharyngeal erythema or exudate noted. S/P uvulectomy  Neck:  No deformities,  masses, or tenderness noted.     Heart:  Normal rate and regular rhythm. S1 and S2 normal without gallop, murmur, click, rub .S 4 Lungs:Chest clear to auscultation; no wheezes, rhonchi,rales ,or rubs present.No increased work of breathing.     Extremities:  No cyanosis, edema, or clubbing  noted . Minor residual scarring at the site of previous cellulitis of  L index finger   Skin: Warm & dry        Assessment & Plan:  #1 cough; his history suggests this is related to reflux. Doxycycline might  compromise the GE valvular function. He has also had dysphasia on occasion.  Plan: Omeprazole twice a day as a trial. Antireflux regimen will be discussed.

## 2011-02-01 NOTE — Patient Instructions (Signed)
The triggers for dyspepsia or "heart burn"  include stress; the "aspirin family" ; alcohol; peppermint; and caffeine (coffee, tea, cola, and chocolate). The aspirin family would include aspirin and the nonsteroidal agents such as ibuprofen &  Naproxen. Tylenol would not cause reflux. If having dyspepsia ; food & drink should be avoided for @ least 2 hours before going to bed.  

## 2011-02-13 ENCOUNTER — Telehealth: Payer: Self-pay | Admitting: Internal Medicine

## 2011-02-13 NOTE — Telephone Encounter (Signed)
Patient is calling, was trying to get into "[facility]", but he wasn't given paperwork with the "avs" code.  Please reprint patient instructions for patient code.

## 2011-02-13 NOTE — Telephone Encounter (Signed)
I appended last OV, re-printed AVS and called patient with activation code.

## 2011-06-01 ENCOUNTER — Other Ambulatory Visit: Payer: Self-pay | Admitting: Internal Medicine

## 2011-06-13 ENCOUNTER — Emergency Department (HOSPITAL_COMMUNITY): Payer: BC Managed Care – PPO

## 2011-06-13 ENCOUNTER — Other Ambulatory Visit: Payer: Self-pay

## 2011-06-13 ENCOUNTER — Emergency Department (HOSPITAL_COMMUNITY)
Admission: EM | Admit: 2011-06-13 | Discharge: 2011-06-13 | Disposition: A | Discharge status: Home / Self Care | Payer: BC Managed Care – PPO | Attending: Emergency Medicine | Admitting: Emergency Medicine

## 2011-06-13 ENCOUNTER — Encounter (HOSPITAL_COMMUNITY): Payer: Self-pay | Admitting: *Deleted

## 2011-06-13 ENCOUNTER — Telehealth: Payer: Self-pay | Admitting: Internal Medicine

## 2011-06-13 DIAGNOSIS — R0602 Shortness of breath: Secondary | ICD-10-CM | POA: Insufficient documentation

## 2011-06-13 DIAGNOSIS — F419 Anxiety disorder, unspecified: Secondary | ICD-10-CM

## 2011-06-13 DIAGNOSIS — K219 Gastro-esophageal reflux disease without esophagitis: Secondary | ICD-10-CM

## 2011-06-13 DIAGNOSIS — E785 Hyperlipidemia, unspecified: Secondary | ICD-10-CM | POA: Insufficient documentation

## 2011-06-13 DIAGNOSIS — K589 Irritable bowel syndrome without diarrhea: Secondary | ICD-10-CM | POA: Insufficient documentation

## 2011-06-13 DIAGNOSIS — F411 Generalized anxiety disorder: Secondary | ICD-10-CM | POA: Insufficient documentation

## 2011-06-13 LAB — POCT I-STAT TROPONIN I: Troponin i, poc: 0.01 ng/mL (ref 0.00–0.08)

## 2011-06-13 MED ORDER — OMEPRAZOLE 20 MG PO CPDR: 20.0000 mg | DELAYED_RELEASE_CAPSULE | Freq: Every day | ORAL | Status: DC

## 2011-06-13 MED ORDER — FAMOTIDINE 20 MG PO TABS
20.0000 mg | ORAL_TABLET | Freq: Once | ORAL | Status: AC
  Administered 2011-06-13: 20 mg via ORAL
  Filled 2011-06-13: qty 1

## 2011-06-13 NOTE — Telephone Encounter (Signed)
Pt/Gregory Owens, calling for SOB. Onset 06/13/11. AFebrile. Neg for Cough. All 911 sxs per Difficulty breathing protocol r/o. Advised ED per guidelines. (Office closing in 5 minutes)

## 2011-06-13 NOTE — ED Provider Notes (Signed)
History     CSN: [id number]  Arrival date & time 06/13/11  1715   First MD Initiated Contact with Patient 06/13/11 2015      Chief Complaint  Patient presents with  . Shortness of Breath    (Consider location/radiation/quality/duration/timing/severity/associated sxs/prior treatment) Patient is a 41 y.o. male presenting with shortness of breath. The history is provided by the patient. No language interpreter was used.  Shortness of Breath  The current episode started 2 days ago. The onset was gradual. Associated symptoms include shortness of breath. Pertinent negatives include no chest pain, no cough and no wheezing.  Patient here today plane of shortness of breath was gradual onset x2 days. Call Dr. [name] his PCP  told him to come to the ER. Patient denies any chest pain and or calf pain. No any upper respiratory symptoms. Denies cough. He said the SOB got better after he passed some gas. Feels like this is similar to the GERD he had a 1 year ago. Patient was on medication a year ago for GERD but he stopped taking it. He had a chest x-ray which was normal today and a normal EKG today. He had a negative stress Cardiolite in 2003. Smoker. Past medical history of IBS anxiety hyperlipidemia hyperglycemia dehydration. Patient does not appear short of breath presently states that he already feels better.  Past Medical History  Diagnosis Date  . IBS (irritable bowel syndrome)   . Abnormal EKG     NS ST-T EKG changes with negative Stress cardiolite 2003  . Anxiety   . Hyperlipidemia   . Hyperglycemia   . Dehydration 2005    [place] , [place]    Past Surgical History  Procedure Date  . Uvulopalatopharyngoplasty 2004    Post op bleeding complication;  Dr Jearld Fenton    Family History  Problem Relation Age of Onset  . Hyperlipidemia Mother   . Hypertension Mother   . Other Mother     Inflammatory Occipital Lymphadenitis  . Hypertension Father   . Hyperlipidemia Father     dyslipidemia  .  Diabetes Maternal Grandmother   . Heart attack Maternal Grandmother     @ 60  . Diabetes Paternal Grandmother   . Stroke Paternal Grandmother   . Heart attack Paternal Grandmother     @ 55  . Cirrhosis Brother     autoimmune hepatitis (twin)    History  Substance Use Topics  . Smoking status: Never Smoker   . Smokeless tobacco: Never Used  . Alcohol Use: No      Review of Systems  Constitutional: Negative.   HENT: Negative.   Eyes: Negative.   Respiratory: Positive for shortness of breath. Negative for cough, chest tightness and wheezing.   Cardiovascular: Negative.  Negative for chest pain and leg swelling.  Gastrointestinal: Negative.  Negative for abdominal distention.  Musculoskeletal: Negative.   Neurological: Negative.  Negative for dizziness, syncope, weakness and light-headedness.  Psychiatric/Behavioral: Negative.   All other systems reviewed and are negative.    Allergies  Review of patient's allergies indicates no known allergies.  Home Medications   Current Outpatient Rx  Name Route Sig Dispense Refill  . CETIRIZINE HCL 10 MG PO TABS Oral Take 10 mg by mouth daily.      . VENLAFAXINE HCL ER 150 MG PO CP24 Oral Take 150 mg by mouth daily.      BP 137/81  Pulse 106  Temp(Src) 98.2 F (36.8 C) (Oral)  Resp 20  SpO2 98%  Physical Exam  Nursing note and vitals reviewed. Constitutional: He is oriented to person, place, and time. He appears well-developed and well-nourished.  HENT:  Head: Normocephalic.  Eyes: Conjunctivae and EOM are normal. Pupils are equal, round, and reactive to light.  Neck: Normal range of motion. Neck supple.  Cardiovascular: Normal rate.   Pulmonary/Chest: Effort normal and breath sounds normal. No respiratory distress. He has no wheezes. He has no rales. He exhibits no tenderness.  Abdominal: Soft.  Musculoskeletal: Normal range of motion.  Neurological: He is alert and oriented to person, place, and time.  Skin: Skin is  warm and dry.  Psychiatric: He has a normal mood and affect.    ED Course  Procedures (including critical care time)  Labs Reviewed - No data to display Dg Chest 2 View  06/13/2011  *RADIOLOGY REPORT*  Clinical Data: Shortness of breath.  CHEST - 2 VIEW  Comparison: None.  Findings: The heart size is normal.  The lungs are clear.  The visualized soft tissues and bony thorax are unremarkable.  IMPRESSION: Negative chest.  Original Report Authenticated By: Jamesetta Orleans. MATTERN, M.D.     No diagnosis found.    MDM  Gradual SOB x 2 day.  Better after burping and having gas.  EKG and troponin unremarkable.  Chest x-ray normal.  This is similar to GERD he experienced 1 year ago.  Will follow up with Dr. [name] tomorrow.  Do not suspect PE or ACS.  Rx for priolsec        Remi Haggard, NP 06/13/11 2243

## 2011-06-13 NOTE — Discharge Instructions (Signed)
Mr Heslop EKG today showed no signs of a heart attack. Labs we checked your heart showed no signs of a heart attack as well. Suspect her shortness of breath is from the gastric filling earlier. He can take some over-the-counter Pepcid daily for several days. Need to you can also try the gas ask that you suggested. Chest x-ray today was also normal. Call in the morning and make an appointment with Dr. [name] to be seen next week. Return to the ER for severe shortness of breath or chest pain.

## 2011-06-13 NOTE — Telephone Encounter (Signed)
Patient was advised to go to the ER, please check on him in the morning

## 2011-06-13 NOTE — ED Notes (Signed)
The pt has had sob for 2 days.  No recent cold.  No pain no asthma history

## 2011-06-14 NOTE — Telephone Encounter (Signed)
Spoke with patient, patient states he feels much better today.

## 2011-06-14 NOTE — ED Provider Notes (Signed)
Medical screening examination/treatment/procedure(s) were performed by non-physician practitioner and as supervising physician I was immediately available for consultation/collaboration.  Khalaya Mcgurn R. Vignesh Willert, MD 06/14/11 0017 

## 2011-06-14 NOTE — Telephone Encounter (Signed)
I see that patient did follow through and go to the ER. I called and left message on voicemail for patient to return call

## 2011-06-26 ENCOUNTER — Ambulatory Visit: Payer: BC Managed Care – PPO | Admitting: Internal Medicine

## 2011-06-28 ENCOUNTER — Encounter: Payer: Self-pay | Admitting: Internal Medicine

## 2011-06-28 ENCOUNTER — Ambulatory Visit (INDEPENDENT_AMBULATORY_CARE_PROVIDER_SITE_OTHER): Payer: BC Managed Care – PPO | Admitting: Internal Medicine

## 2011-06-28 VITALS — BP 112/70 | HR 93 | Wt 265.0 lb

## 2011-06-28 DIAGNOSIS — R7309 Other abnormal glucose: Secondary | ICD-10-CM

## 2011-06-28 DIAGNOSIS — E8881 Metabolic syndrome: Secondary | ICD-10-CM

## 2011-06-28 DIAGNOSIS — R635 Abnormal weight gain: Secondary | ICD-10-CM

## 2011-06-28 NOTE — Patient Instructions (Signed)
Preventive Health Care: Exercise at least 30-45 minutes a day,  3-4 days a week.   Eat a low-fat diet with lots of fruits and vegetables, up to 7-9 servings per day. Consume less than 40 ( preferably ZERO) grams of sugar per day from foods & drinks with High Fructose Corn Syrup (HFCS) sugar as #1,2,3 or # 4 on label.Whole Foods, [employer] & [employer] do not carry products with HFCS. Follow a  low carb nutrition program such as [place] or [employer]  to prevent Diabetes . White carbohydrates (potatoes, rice, bread, and pasta) have a high spike of sugar and a high load of sugar. For example a  baked potato has a cup of sugar and a  french fry  2 teaspoons of sugar. Yams, wild  rice, whole grained bread &  wheat pasta have been much lower spike and load of  sugar. Portions should be the size of a deck of cards or your palm.  Please try to go on My Chart within the next 24 hours to allow me to release the results directly to you.

## 2011-06-28 NOTE — Progress Notes (Signed)
  Subjective:    Patient ID: Gregory Owens, male    DOB: 04-Mar-1970, 41 y.o.   MRN: [id number]  HPI   He is concerned about his weight; it has been stable but has stayed in the present range. After his brothers death he did eat "emotionally" which was a factor.  He questions using dietary supplements for weight loss, "to jump start the process"    He describes some fatigue. He has not had significant constipation, blurry vision, double vision, or change in his hair skin or nails.  He is not had hoarseness or trouble swallowing.  There is no family history of thyroid disorder    Review of Systems  He was seen in the emergency room recently for shortness of breath attributed to GERD. He does have obstructive sleep apnea.  Review of the chart demonstrated a normal TSH in 2012. He's had mild hyperglycemia as well as elevated triglycerides at times     Objective:   Physical Exam Gen.:  well-nourished;weight excess. He is in no acute distress Eyes: Extraocular motion intact; no lid lag or proptosis Heart: Normal rhythm and rate without significant murmur, gallop, or extra heart sounds Lungs: Chest clear to auscultation without rales,rales, wheezes Neuro:Deep tendon reflexes are equal and within normal limits; no tremor  Skin: Warm and dry without significant lesions or rashes; no onycholysis Psych: Normally communicative and interactive; no abnormal mood or affect clinically.         Assessment & Plan:  #1 weight gain  #2 fasting hyperglycemia  #3 metabolic syndrome; pathophysiology discussed  Plan: See orders and recommendations

## 2011-06-29 LAB — TSH: TSH: 2 u[IU]/mL (ref 0.35–5.50)

## 2011-06-29 LAB — HEMOGLOBIN A1C: Hgb A1c MFr Bld: 6.4 % (ref 4.6–6.5)

## 2011-06-29 LAB — T4, FREE: Free T4: 0.76 ng/dL (ref 0.60–1.60)

## 2011-10-29 ENCOUNTER — Other Ambulatory Visit: Payer: Self-pay | Admitting: Internal Medicine

## 2011-10-29 MED ORDER — VENLAFAXINE HCL ER 150 MG PO CP24: 150.0000 mg | ORAL_CAPSULE | Freq: Every day | ORAL | Status: DC

## 2011-10-29 NOTE — Telephone Encounter (Signed)
RX sent, patient with pending appointment November 2013

## 2011-10-29 NOTE — Telephone Encounter (Signed)
Refill refill EFFEXOR-XR 150 MG Take 150 mg by mouth daily. 390-DAY SUPPLY REQ. due to insurance LAST FILL NOT LISTED last ov 5.30.13 weight ck

## 2011-12-25 ENCOUNTER — Ambulatory Visit (INDEPENDENT_AMBULATORY_CARE_PROVIDER_SITE_OTHER): Payer: 59 | Admitting: Internal Medicine

## 2011-12-25 ENCOUNTER — Encounter: Payer: Self-pay | Admitting: Internal Medicine

## 2011-12-25 VITALS — BP 124/86 | HR 92 | Temp 98.0°F | Resp 12 | Ht 67.03 in | Wt 264.0 lb

## 2011-12-25 DIAGNOSIS — Z23 Encounter for immunization: Secondary | ICD-10-CM

## 2011-12-25 DIAGNOSIS — Z0000 Encounter for general adult medical examination without abnormal findings: Secondary | ICD-10-CM

## 2011-12-25 LAB — HEPATIC FUNCTION PANEL
ALT: 20 U/L (ref 0–53)
AST: 19 U/L (ref 0–37)
Albumin: 3.7 g/dL (ref 3.5–5.2)
Alkaline Phosphatase: 63 U/L (ref 39–117)
Bilirubin, Direct: 0.1 mg/dL (ref 0.0–0.3)
Total Bilirubin: 0.4 mg/dL (ref 0.3–1.2)
Total Protein: 7.4 g/dL (ref 6.0–8.3)

## 2011-12-25 LAB — CBC WITH DIFFERENTIAL/PLATELET
Basophils Absolute: 0 10*3/uL (ref 0.0–0.1)
Basophils Relative: 0.3 % (ref 0.0–3.0)
Eosinophils Absolute: 0.2 10*3/uL (ref 0.0–0.7)
Eosinophils Relative: 2.8 % (ref 0.0–5.0)
HCT: 44.1 % (ref 39.0–52.0)
Hemoglobin: 14.4 g/dL (ref 13.0–17.0)
Lymphocytes Relative: 31 % (ref 12.0–46.0)
Lymphs Abs: 2.2 10*3/uL (ref 0.7–4.0)
MCHC: 32.7 g/dL (ref 30.0–36.0)
MCV: 92.8 fl (ref 78.0–100.0)
Monocytes Absolute: 0.4 10*3/uL (ref 0.1–1.0)
Monocytes Relative: 5.9 % (ref 3.0–12.0)
Neutro Abs: 4.2 10*3/uL (ref 1.4–7.7)
Neutrophils Relative %: 60 % (ref 43.0–77.0)
Platelets: 281 10*3/uL (ref 150.0–400.0)
RBC: 4.75 Mil/uL (ref 4.22–5.81)
RDW: 13.1 % (ref 11.5–14.6)
WBC: 7 10*3/uL (ref 4.5–10.5)

## 2011-12-25 LAB — LIPID PANEL
Cholesterol: 164 mg/dL (ref 0–200)
HDL: 32.1 mg/dL — ABNORMAL LOW (ref >39.00)
LDL Cholesterol: 106 mg/dL — ABNORMAL HIGH (ref 0–99)
Total CHOL/HDL Ratio: 5
Triglycerides: 131 mg/dL (ref 0.0–149.0)
VLDL: 26.2 mg/dL (ref 0.0–40.0)

## 2011-12-25 LAB — BASIC METABOLIC PANEL
BUN: 12 mg/dL (ref 6–23)
CO2: 28 mEq/L (ref 19–32)
Calcium: 8.8 mg/dL (ref 8.4–10.5)
Chloride: 101 mEq/L (ref 96–112)
Creatinine, Ser: 0.9 mg/dL (ref 0.4–1.5)
GFR: 114.3 mL/min (ref >60.00)
Glucose, Bld: 117 mg/dL — ABNORMAL HIGH (ref 70–99)
Potassium: 3.5 mEq/L (ref 3.5–5.1)
Sodium: 136 mEq/L (ref 135–145)

## 2011-12-25 LAB — MICROALBUMIN / CREATININE URINE RATIO
Creatinine,U: 375 mg/dL
Microalb Creat Ratio: 0.3 mg/g (ref 0.0–30.0)
Microalb, Ur: 1 mg/dL (ref 0.0–1.9)

## 2011-12-25 LAB — RPR

## 2011-12-25 LAB — TSH: TSH: 1.63 u[IU]/mL (ref 0.35–5.50)

## 2011-12-25 LAB — HEMOGLOBIN A1C: Hgb A1c MFr Bld: 6.4 % (ref 4.6–6.5)

## 2011-12-25 MED ORDER — VENLAFAXINE HCL ER 150 MG PO CP24: 150.0000 mg | ORAL_CAPSULE | Freq: Every day | ORAL | Status: DC

## 2011-12-25 NOTE — Progress Notes (Signed)
  Subjective:    Patient ID: Gregory Owens, male    DOB: [birth year], 41 y.o.   MRN: [id number]  HPI  Gregory Owens is here for a physical;acute issues are described below      Review of Systems Extremity pain Location:bilateral hip & shoulders Onset:2 mos ago Trigger/injury:no Pain quality:dull Pain severity:up to 5 Duration:only while in either  LDP Radiation:no  Treatment/response:change position  Constitutional: no fever, chills, sweats, change in weight  Musculoskeletal:no  muscle cramps or pain; no  joint stiffness, redness, or swelling Skin:no rash, color/temp change Neuro: no weakness; incontinence (stool/urine); numbness and tingling Heme:no lymphadenopathy; abnormal bruising or bleeding        Objective:   Physical Exam  Gen.: Healthy and well-nourished in appearance. Alert, appropriate and cooperative throughout exam. Head: Normocephalic without obvious abnormalities  Eyes: No corneal or conjunctival inflammation noted. Pupils equal round reactive to light and accommodation. Fundal exam is benign without hemorrhages, exudate, papilledema. Extraocular motion intact. Vision grossly normal. Ears: External  ear exam reveals no significant lesions or deformities. Canals clear .TMs normal. Hearing is grossly normal bilaterally. Nose: External nasal exam reveals no deformity or inflammation. Nasal mucosa are pink and moist. No lesions or exudates noted.   Mouth: Oral mucosa and oropharynx reveal no lesions or exudates. Teeth in good repair. Neck: No deformities, masses, or tenderness noted. Range of motion & Thyroid normal. Lungs: Normal respiratory effort; chest expands symmetrically. Lungs are clear to auscultation without rales, wheezes, or increased work of breathing. Heart: Normal rate and rhythm. Normal S1 and S2. No gallop, click, or rub. S4 w/o murmur. Abdomen: Bowel sounds normal; abdomen soft and nontender. No masses, organomegaly or hernias  noted. Genitalia/DRE: Genitalia normal except for left varices. Prostate is normal without enlargement, asymmetry, nodularity, or induration.  Musculoskeletal/extremities: No deformity or scoliosis noted of  the thoracic or lumbar spine. No clubbing, cyanosis, edema, or deformity noted. Range of motion  normal .Tone & strength  normal.Joints normal. Nail health  good. Vascular: Carotid, radial artery, dorsalis pedis and  posterior tibial pulses are full and equal. No bruits present. Neurologic: Alert and oriented x3. Deep tendon reflexes symmetrical and normal.         Skin: Intact without suspicious lesions or rashes. Lymph: No cervical, axillary, or inguinal lymphadenopathy present. Psych: Mood and affect are normal. Normally interactive                                                                                        Assessment & Plan:  #1 comprehensive physical exam; no acute findings #2 see Problem List with Assessments & Recommendations Plan: see Orders

## 2011-12-25 NOTE — Patient Instructions (Addendum)
Preventive Health Care: Exercise at least 30-45 minutes a day,  3-4 days a week.  Eat a low-fat diet with lots of fruits and vegetables, up to 7-9 servings per day.  Consume less than 40 grams of sugar per day from foods & drinks with High Fructose Corn Sugar as #1,2,3 or # 4 on label. Health Care Power of Attorney & Living Will. Complete if not in place ; these place you in charge of your health care decisions. Consider memory foam mattress to prevent joint symptoms.

## 2011-12-26 LAB — HIV ANTIBODY (ROUTINE TESTING W REFLEX): HIV: NONREACTIVE

## 2011-12-26 LAB — HSV 2 ANTIBODY, IGG: HSV 2 Glycoprotein G Ab, IgG: <0.1 IV

## 2012-05-07 ENCOUNTER — Encounter: Payer: Self-pay | Admitting: Internal Medicine

## 2012-05-07 ENCOUNTER — Ambulatory Visit (INDEPENDENT_AMBULATORY_CARE_PROVIDER_SITE_OTHER): Payer: BC Managed Care – PPO | Admitting: Internal Medicine

## 2012-05-07 VITALS — BP 124/78 | HR 111 | Temp 98.3°F | Wt 269.0 lb

## 2012-05-07 DIAGNOSIS — G259 Extrapyramidal and movement disorder, unspecified: Secondary | ICD-10-CM

## 2012-05-07 DIAGNOSIS — K219 Gastro-esophageal reflux disease without esophagitis: Secondary | ICD-10-CM

## 2012-05-07 DIAGNOSIS — G4730 Sleep apnea, unspecified: Secondary | ICD-10-CM

## 2012-05-07 DIAGNOSIS — R259 Unspecified abnormal involuntary movements: Secondary | ICD-10-CM

## 2012-05-07 DIAGNOSIS — R000 Tachycardia, unspecified: Secondary | ICD-10-CM

## 2012-05-07 MED ORDER — OMEPRAZOLE 20 MG PO CPDR: 20.0000 mg | DELAYED_RELEASE_CAPSULE | Freq: Every day | ORAL | Status: DC

## 2012-05-07 NOTE — Patient Instructions (Addendum)
Reflux of gastric acid may be asymptomatic as this may occur mainly during sleep.The triggers for reflux  include stress; the "aspirin family" ; alcohol; peppermint; and caffeine (coffee, tea, cola, and chocolate). The aspirin family would include aspirin and the nonsteroidal agents such as ibuprofen &  Naproxen. Tylenol would not cause reflux. If having symptoms ; food & drink should be avoided for @ least 2 hours before going to bed.  

## 2012-05-07 NOTE — Progress Notes (Signed)
  Subjective:    Patient ID: Gregory Owens, male    DOB: Oct 07, 1970, 42 y.o.   MRN: [id number]  HPI Symptoms began 05/05/12 as a vague pressure associated with nausea in the epigastrium. He has had associated significant dyspepsia without frank dysphasia. The trigger appears to be meals.  He specifically denies unexplained weight loss, melena, or rectal bleeding.  He had previously been Rxed omeprazole but this was never initiated .    Review of Systems He has had no associated diaphoresis or chest, neck, or arm pain.  His wife is on speaker phone at the time of the visit. She describes restless sleep with a "bicycling" motor activity of the legs intermittently lasting up to 20-30 seconds. She states he seems to struggle for air before his airway "opens up". She denies any frank apnea.  He has had a uvuloplasty for sleep apnea. This was complicated postoperatively by severe upper airway bleeding       Objective:   Physical Exam General appearance :well nourished but weight excess;w/o distress.  Eyes: No conjunctival inflammation or scleral icterus is present.  Oral exam: Dental hygiene is good; lips and gums are healthy appearing.There is no oropharyngeal erythema or exudate noted. S/P uvuloplasty  Heart:  Rapid rate and regular rhythm. S1 and S2 normal without  murmur, click, rub or other extra sounds    Gynecomastia  Lungs:Chest clear to auscultation; no wheezes, rhonchi,rales ,or rubs present.No increased work of breathing. Decreased BS  Abdomen: bowel sounds normal, soft and non-tender without masses, organomegaly or hernias noted.  No guarding or rebound   Skin:Warm & dry.  Intact without suspicious lesions or rashes ; no tenting. Striae over abdomen  Lymphatic: No lymphadenopathy is noted about the head, neck, axilla areas.              Assessment & Plan:  #1 probable hiatal hernia and significant gastroesophageal reflux  #2 history from wife is  worrisome for sleep apnea despite history of uvuloplasty  #3 possible restless leg syndrome  #4 tachycardia; EKG reveals low voltage and diffuse nonspecific ST-T changes which are stable compared to 06/13/11.  Plan: See orders recommendations

## 2012-05-08 ENCOUNTER — Ambulatory Visit: Payer: BC Managed Care – PPO

## 2012-05-08 DIAGNOSIS — R7309 Other abnormal glucose: Secondary | ICD-10-CM

## 2012-05-08 LAB — CBC WITH DIFFERENTIAL/PLATELET
Basophils Absolute: 0.1 10*3/uL (ref 0.0–0.1)
Basophils Relative: 0.5 % (ref 0.0–3.0)
Eosinophils Absolute: 0.2 10*3/uL (ref 0.0–0.7)
Eosinophils Relative: 1.8 % (ref 0.0–5.0)
HCT: 43.5 % (ref 39.0–52.0)
Hemoglobin: 14.4 g/dL (ref 13.0–17.0)
Lymphocytes Relative: 25.6 % (ref 12.0–46.0)
Lymphs Abs: 2.9 10*3/uL (ref 0.7–4.0)
MCHC: 33 g/dL (ref 30.0–36.0)
MCV: 92.3 fl (ref 78.0–100.0)
Monocytes Absolute: 0.6 10*3/uL (ref 0.1–1.0)
Monocytes Relative: 5.6 % (ref 3.0–12.0)
Neutro Abs: 7.6 10*3/uL (ref 1.4–7.7)
Neutrophils Relative %: 66.5 % (ref 43.0–77.0)
Platelets: 299 10*3/uL (ref 150.0–400.0)
RBC: 4.71 Mil/uL (ref 4.22–5.81)
RDW: 13.2 % (ref 11.5–14.6)
WBC: 11.4 10*3/uL — ABNORMAL HIGH (ref 4.5–10.5)

## 2012-05-08 LAB — BASIC METABOLIC PANEL
BUN: 12 mg/dL (ref 6–23)
CO2: 28 mEq/L (ref 19–32)
Calcium: 9.1 mg/dL (ref 8.4–10.5)
Chloride: 103 mEq/L (ref 96–112)
Creatinine, Ser: 1 mg/dL (ref 0.4–1.5)
GFR: 107.41 mL/min (ref >60.00)
Glucose, Bld: 146 mg/dL — ABNORMAL HIGH (ref 70–99)
Potassium: 3.7 mEq/L (ref 3.5–5.1)
Sodium: 140 mEq/L (ref 135–145)

## 2012-05-08 LAB — T3, FREE: T3, Free: 2.7 pg/mL (ref 2.3–4.2)

## 2012-05-08 LAB — MAGNESIUM: Magnesium: 2.1 mg/dL (ref 1.5–2.5)

## 2012-05-08 LAB — HEMOGLOBIN A1C: Hgb A1c MFr Bld: 6.9 % — ABNORMAL HIGH (ref 4.6–6.5)

## 2012-05-08 LAB — TSH: TSH: 1.63 u[IU]/mL (ref 0.35–5.50)

## 2012-05-09 ENCOUNTER — Ambulatory Visit: Payer: BC Managed Care – PPO

## 2012-05-12 ENCOUNTER — Telehealth: Payer: Self-pay | Admitting: Internal Medicine

## 2012-05-12 ENCOUNTER — Encounter: Payer: Self-pay | Admitting: Internal Medicine

## 2012-05-12 DIAGNOSIS — E119 Type 2 diabetes mellitus without complications: Secondary | ICD-10-CM

## 2012-05-12 DIAGNOSIS — E669 Obesity, unspecified: Secondary | ICD-10-CM

## 2012-05-12 NOTE — Telephone Encounter (Signed)
Patient states we are supposed to be working on a referral to a nutritionist for him. I do not see anything in his chart. Please advise.

## 2012-05-12 NOTE — Telephone Encounter (Signed)
Hopp please advise, I do not see mention of referral to nutritionist

## 2012-05-12 NOTE — Telephone Encounter (Signed)
Nutrition referral OK ; DM ,obesity

## 2012-05-13 NOTE — Telephone Encounter (Signed)
Discuss with patient, referral placed 

## 2012-05-19 ENCOUNTER — Other Ambulatory Visit: Payer: Self-pay

## 2012-05-19 ENCOUNTER — Encounter: Payer: Self-pay | Admitting: Internal Medicine

## 2012-05-19 MED ORDER — GLUCOSE BLOOD VI STRP: ORAL_STRIP | Status: DC

## 2012-05-30 ENCOUNTER — Encounter: Payer: Self-pay | Admitting: Pulmonary Disease

## 2012-05-30 ENCOUNTER — Ambulatory Visit (INDEPENDENT_AMBULATORY_CARE_PROVIDER_SITE_OTHER): Payer: BC Managed Care – PPO | Admitting: Pulmonary Disease

## 2012-05-30 VITALS — BP 108/70 | HR 87 | Temp 97.8°F | Ht 67.5 in | Wt 268.4 lb

## 2012-05-30 DIAGNOSIS — G4733 Obstructive sleep apnea (adult) (pediatric): Secondary | ICD-10-CM

## 2012-05-30 DIAGNOSIS — G4761 Periodic limb movement disorder: Secondary | ICD-10-CM | POA: Insufficient documentation

## 2012-05-30 NOTE — Patient Instructions (Addendum)
It is unclear if you have sleep apnea at this point or not, but you are not overly symptomatic.  Because of this, we can either schedule for sleep study and put the issue to rest, or you can take the next 6mos to work on weight loss.  If you have not lost 25 pounds in 6mos, or if you become more symptomatic, would proceed with sleep study.  Please call me if this occurs so that it can be scheduled.

## 2012-05-30 NOTE — Assessment & Plan Note (Signed)
The patient has a history of sleep apnea of unknown severity, but tells me that he had total resolution of symptoms (including snoring) after upper airway surgery.  His wife has male her for an abnormal breathing pattern during sleep along with snoring, but the patient is not significantly symptomatic.  He never had a followup study after his surgery, so it is unclear if his sleep apnea was truly resolved.  At this point, it is unclear if he still has sleep apnea or not.  I have reviewed the various approaches to this issue, and have given him the choice of proceeding with a sleep study for diagnosis versus giving him 6 months to work aggressively on weight loss.  He would prefer the latter, but is willing to have the sleep study if he is not successful over the next 6 months with weight loss.

## 2012-05-30 NOTE — Assessment & Plan Note (Signed)
The patient is describing limb movements during sleep that have been observed by his bed partner, but he does not have classic restless leg symptoms in the evening.  He could have the periodic limb movement disorder, or this simply could be secondary to sleep disordered breathing.  He is on Effexor, which can be a culprit for RLS.  At this point, I would like to see how he does with ongoing weight loss, and he will let me know if his leg symptoms worsen.

## 2012-05-30 NOTE — Progress Notes (Signed)
Subjective:    Patient ID: Gregory Owens, male    DOB: 10/23/1970, 42 y.o.   MRN: [id number]  HPI The patient is a 42 year old male who I've been asked to see for possible sleep apnea.  The patient has been diagnosed with sleep apnea in 2004, and ultimately had what sounds like upper airway surgery in 2005 by otolaryngology.  The patient states that his snoring and nighttime symptoms resolved, however he never had a followup sleep study.  He has gained about 20 pounds since his prior study, and now his wife his hearing an occasional abnormal breathing pattern during sleep as well as snoring.  Despite this, the patient is having no significant awakenings at night, and feels that he is fairly rested in the mornings upon arising.  He notes some decreased focus and concentration during the day, but denies frank sleepiness.  He does have some sleep pressure in the evenings watching television or movies.  He denies any issues with sleepiness while driving.  His wife has commented on abnormal kicking and movements during the night, but he really does not have symptoms to suggest RLS.  The patient's Epworth sleepiness score today is abnormal at 13.   Sleep Questionnaire What time do you typically go to bed?( Between what hours) 12a-1a 12a-1a at 1022 on 05/30/12 by Nita Sells, CMA How long does it take you to fall asleep? 2hrs 2hrs at 1022 on 05/30/12 by Nita Sells, CMA How many times during the night do you wake up? 1 1 at 1022 on 05/30/12 by Nita Sells, CMA What time do you get out of bed to start your day? 0900 0900 at 1022 on 05/30/12 by Nita Sells, CMA Do you drive or operate heavy machinery in your occupation? No No at 1022 on 05/30/12 by Nita Sells, CMA How much has your weight changed (up or down) over the past two years? (In pounds) 10 lb (4.536 kg)10 lb (4.536 kg) increase at 1022 on 05/30/12 by Nita Sells, CMA Have you ever had a sleep study before?  Yes Yes at 1022 on 05/30/12 by Nita Sells, CMA If yes, location of study? [facility] at 1022 on 05/30/12 by Nita Sells, CMA If yes, date of study? 2004 2004 at 1022 on 05/30/12 by Nita Sells, CMA Do you currently use CPAP? No No at 1022 on 05/30/12 by Marjo Bicker Mabe, CMA Do you wear oxygen at any time? No No at 1022 on 05/30/12 by Marjo Bicker Mabe, CMA   Review of Systems  Constitutional: Negative for fever and unexpected weight change.  HENT: Negative for ear pain, nosebleeds, congestion, sore throat, rhinorrhea, sneezing, trouble swallowing, dental problem, postnasal drip and sinus pressure.   Eyes: Negative for redness and itching.  Respiratory: Negative for cough, chest tightness, shortness of breath and wheezing.   Cardiovascular: Negative for palpitations and leg swelling.  Gastrointestinal: Negative for nausea and vomiting.  Genitourinary: Negative for dysuria.  Musculoskeletal: Positive for back pain ( upper back). Negative for joint swelling.  Skin: Negative for rash.  Neurological: Negative for headaches.  Hematological: Does not bruise/bleed easily.  Psychiatric/Behavioral: Negative for dysphoric mood. The patient is not nervous/anxious.        Objective:   Physical Exam Constitutional:  Obese male, no acute distress  HENT:  Nares patent without discharge  Oropharynx without exudate, palate and uvula are trimmed, but there is still narrowing posteriorly  Eyes:  Perrla, eomi, no scleral  icterus  Neck:  No JVD, no TMG  Cardiovascular:  Normal rate, regular rhythm, no rubs or gallops.  No murmurs        Intact distal pulses  Pulmonary :  Normal breath sounds, no stridor or respiratory distress   No rales, rhonchi, or wheezing  Abdominal:  Soft, nondistended, bowel sounds present.  No tenderness noted.   Musculoskeletal:  No lower extremity edema noted.  Lymph Nodes:  No cervical lymphadenopathy noted  Skin:  No cyanosis noted  Neurologic:   Alert, appropriate, moves all 4 extremities without obvious deficit.         Assessment & Plan:

## 2012-06-03 ENCOUNTER — Encounter: Payer: BC Managed Care – PPO | Attending: Internal Medicine | Admitting: *Deleted

## 2012-06-03 ENCOUNTER — Encounter: Payer: Self-pay | Admitting: *Deleted

## 2012-06-03 VITALS — Ht 67.5 in | Wt 263.8 lb

## 2012-06-03 DIAGNOSIS — Z713 Dietary counseling and surveillance: Secondary | ICD-10-CM | POA: Insufficient documentation

## 2012-06-03 DIAGNOSIS — E119 Type 2 diabetes mellitus without complications: Secondary | ICD-10-CM | POA: Insufficient documentation

## 2012-06-03 DIAGNOSIS — E669 Obesity, unspecified: Secondary | ICD-10-CM | POA: Insufficient documentation

## 2012-06-03 NOTE — Patient Instructions (Signed)
Plan:  Aim for 4 Carb Choices per meal (60 grams) +/- 1 either way  Aim for 0-2 Carbs per snack if hungry  Consider reading food labels for Total Carbohydrate of foods Consider  increasing your activity daily as tolerated Consider checking BG at alternate times per day

## 2012-06-03 NOTE — Progress Notes (Signed)
Last A1c on 05/08/12 was 6.9%

## 2012-06-03 NOTE — Progress Notes (Signed)
  Medical Nutrition Therapy:  Appt start time: 1200 end time:  1300.  Assessment:  Primary concerns today: patient here for diabetes and obesity. He is here with his wife who appears supportive. 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 They shop and cook together and eat out often. History of pre-diabetes, he is SMBG once a day before breakfast. Activity is minimal.   MEDICATIONS: see list   DIETARY INTAKE:  Usual eating pattern includes 3 meals and 2-3 snacks per day.  Everyday foods include good variety of all food groups.  Avoided foods include: none stated.    24-hr recall:  B ( AM): [place], used to be sausage biscuit with sweet tea or Pepsi, NOW: grilled meat biscuit and diet soda  Snk ( AM): none  L ( PM): used to skip lunch often, or eat out at fast food: sandwich or burger, fries, has switched to diet sodas. Choosing grilled more often. Snk ( PM): used to be a cookie and fruit or a pastry D ( PM): Eat out often: pasta, grilled meat, some vegetables, some salad with Ranch dressing, diet soda Snk ( PM): cookies OR chips OR popcorn Beverages: diet soda, 1% milk and some water  Usual physical activity: none right now  Estimated energy needs: 2100 calories 235 g carbohydrates 158 g protein 58 g fat  Progress Towards Goal(s):  In progress.   Nutritional Diagnosis:  NI-1.5 Excessive energy intake As related to activity level.  As evidenced by BMI of 40.8    Intervention:  Nutrition counseling and diabetes education initiated. Discussed basic physiology of diabetes, SMBG and rationale of checking BG at alternate times of day, A1c, Carb Counting and reading food labels, and benefits of increased activity. Spent majority of time today on Carb Counting, plan to address fat grams and more meal planning at next visit . Plan:  Aim for 4 Carb Choices per meal (60 grams) +/- 1 either way  Aim for 0-2 Carbs per snack if hungry   Consider reading food labels for Total Carbohydrate of foods Consider  increasing your activity daily as tolerated Consider checking BG at alternate times per day   Handouts given during visit include: Living Well with Diabetes Carb Counting and Food Label handouts Meal Plan Card  Monitoring/Evaluation:  Dietary intake, exercise, reading food labels, SMBG, and body weight in 4 week(s).

## 2012-06-16 ENCOUNTER — Encounter: Payer: Self-pay | Admitting: Internal Medicine

## 2012-06-25 ENCOUNTER — Encounter: Payer: Self-pay | Admitting: Internal Medicine

## 2012-07-02 ENCOUNTER — Encounter: Payer: BC Managed Care – PPO | Admitting: Internal Medicine

## 2012-07-14 ENCOUNTER — Ambulatory Visit: Payer: BC Managed Care – PPO | Admitting: *Deleted

## 2012-08-14 ENCOUNTER — Encounter: Payer: Self-pay | Admitting: Internal Medicine

## 2012-08-14 ENCOUNTER — Other Ambulatory Visit: Payer: Self-pay | Admitting: Internal Medicine

## 2012-08-14 DIAGNOSIS — Z0000 Encounter for general adult medical examination without abnormal findings: Secondary | ICD-10-CM

## 2012-08-14 DIAGNOSIS — E785 Hyperlipidemia, unspecified: Secondary | ICD-10-CM

## 2012-08-14 DIAGNOSIS — R739 Hyperglycemia, unspecified: Secondary | ICD-10-CM

## 2012-08-15 ENCOUNTER — Other Ambulatory Visit (INDEPENDENT_AMBULATORY_CARE_PROVIDER_SITE_OTHER): Payer: BC Managed Care – PPO

## 2012-08-15 DIAGNOSIS — E785 Hyperlipidemia, unspecified: Secondary | ICD-10-CM

## 2012-08-15 DIAGNOSIS — R7309 Other abnormal glucose: Secondary | ICD-10-CM

## 2012-08-15 DIAGNOSIS — Z0000 Encounter for general adult medical examination without abnormal findings: Secondary | ICD-10-CM

## 2012-08-15 DIAGNOSIS — R739 Hyperglycemia, unspecified: Secondary | ICD-10-CM

## 2012-08-15 LAB — TSH: TSH: 1.28 u[IU]/mL (ref 0.35–5.50)

## 2012-08-15 LAB — HEMOGLOBIN A1C: Hgb A1c MFr Bld: 6.5 % (ref 4.6–6.5)

## 2012-08-15 LAB — LIPID PANEL
Cholesterol: 155 mg/dL (ref 0–200)
HDL: 32.8 mg/dL — ABNORMAL LOW (ref >39.00)
LDL Cholesterol: 100 mg/dL — ABNORMAL HIGH (ref 0–99)
Total CHOL/HDL Ratio: 5
Triglycerides: 110 mg/dL (ref 0.0–149.0)
VLDL: 22 mg/dL (ref 0.0–40.0)

## 2012-08-15 LAB — BASIC METABOLIC PANEL
BUN: 14 mg/dL (ref 6–23)
CO2: 29 mEq/L (ref 19–32)
Calcium: 9.1 mg/dL (ref 8.4–10.5)
Chloride: 105 mEq/L (ref 96–112)
Creatinine, Ser: 0.9 mg/dL (ref 0.4–1.5)
GFR: 124.72 mL/min (ref >60.00)
Glucose, Bld: 104 mg/dL — ABNORMAL HIGH (ref 70–99)
Potassium: 4 mEq/L (ref 3.5–5.1)
Sodium: 138 mEq/L (ref 135–145)

## 2012-08-15 LAB — HEPATIC FUNCTION PANEL
ALT: 17 U/L (ref 0–53)
AST: 14 U/L (ref 0–37)
Albumin: 3.6 g/dL (ref 3.5–5.2)
Alkaline Phosphatase: 61 U/L (ref 39–117)
Bilirubin, Direct: 0.1 mg/dL (ref 0.0–0.3)
Total Bilirubin: 0.5 mg/dL (ref 0.3–1.2)
Total Protein: 7.2 g/dL (ref 6.0–8.3)

## 2012-08-15 LAB — CBC WITH DIFFERENTIAL/PLATELET
Basophils Absolute: 0 10*3/uL (ref 0.0–0.1)
Basophils Relative: 0.5 % (ref 0.0–3.0)
Eosinophils Absolute: 0.2 10*3/uL (ref 0.0–0.7)
Eosinophils Relative: 2.9 % (ref 0.0–5.0)
HCT: 43 % (ref 39.0–52.0)
Hemoglobin: 14.4 g/dL (ref 13.0–17.0)
Lymphocytes Relative: 29.6 % (ref 12.0–46.0)
Lymphs Abs: 2.5 10*3/uL (ref 0.7–4.0)
MCHC: 33.5 g/dL (ref 30.0–36.0)
MCV: 92.3 fl (ref 78.0–100.0)
Monocytes Absolute: 0.5 10*3/uL (ref 0.1–1.0)
Monocytes Relative: 5.3 % (ref 3.0–12.0)
Neutro Abs: 5.3 10*3/uL (ref 1.4–7.7)
Neutrophils Relative %: 61.7 % (ref 43.0–77.0)
Platelets: 265 10*3/uL (ref 150.0–400.0)
RBC: 4.67 Mil/uL (ref 4.22–5.81)
RDW: 12.6 % (ref 11.5–14.6)
WBC: 8.5 10*3/uL (ref 4.5–10.5)

## 2012-08-19 ENCOUNTER — Encounter: Payer: Self-pay | Admitting: Internal Medicine

## 2012-08-19 ENCOUNTER — Ambulatory Visit (INDEPENDENT_AMBULATORY_CARE_PROVIDER_SITE_OTHER): Payer: BC Managed Care – PPO | Admitting: Internal Medicine

## 2012-08-19 VITALS — BP 126/78 | HR 97 | Temp 98.5°F | Resp 12 | Ht 67.5 in | Wt 262.0 lb

## 2012-08-19 DIAGNOSIS — IMO0002 Reserved for concepts with insufficient information to code with codable children: Secondary | ICD-10-CM | POA: Insufficient documentation

## 2012-08-19 DIAGNOSIS — E119 Type 2 diabetes mellitus without complications: Secondary | ICD-10-CM | POA: Insufficient documentation

## 2012-08-19 DIAGNOSIS — Z23 Encounter for immunization: Secondary | ICD-10-CM

## 2012-08-19 DIAGNOSIS — R7309 Other abnormal glucose: Secondary | ICD-10-CM

## 2012-08-19 DIAGNOSIS — R7303 Prediabetes: Secondary | ICD-10-CM

## 2012-08-19 DIAGNOSIS — Z0000 Encounter for general adult medical examination without abnormal findings: Secondary | ICD-10-CM

## 2012-08-19 DIAGNOSIS — E1165 Type 2 diabetes mellitus with hyperglycemia: Secondary | ICD-10-CM | POA: Insufficient documentation

## 2012-08-19 NOTE — Patient Instructions (Addendum)
Check glucose once daily if possible Fasting or morning glucose recommended M, W, F, & Sun if possible. Goal= 100-150 Glucose 2 hours after breakfast Tues, after lunch Thurs & 2 hrs after eve meal Sat if possible. Goal = < 180 

## 2012-08-19 NOTE — Progress Notes (Signed)
  Subjective:    Patient ID: Gregory Owens, male    DOB: 03/09/1970, 42 y.o.   MRN: [id number]  HPI  He  is here for a physical;acute issues denied.     Review of Systems   With low carb diet and increased exercise he has lost 10 pounds. His A1c has dropped from 6.9% to a value of 6.5% which is borderline diabetes.  His fasting blood sugars have ranged from 91-140. His highest two-hour post meal glucose was less than 150.  He denies polyuria, polyphagia, polydipsia. He has no nonhealing skin lesions. He has no numbness , tingling or burning in his extremities.       Objective:   Physical Exam Gen.: Healthy and well-nourished in appearance. Alert, appropriate and cooperative throughout exam. Appears younger than stated age  Head: Normocephalic without obvious abnormalities; no alopecia  Eyes: No corneal or conjunctival inflammation noted.  Extraocular motion intact. Vision grossly normal without lenses Ears: External  ear exam reveals no significant lesions or deformities. Canals clear .TMs normal. Hearing is grossly normal bilaterally. Nose: External nasal exam reveals no deformity or inflammation. Nasal mucosa are pink and moist. No lesions or exudates noted.  Mouth: Oral mucosa and oropharynx reveal no lesions or exudates. S/P uvulectomy.Teeth in good repair. Neck: No deformities, masses, or tenderness noted. Range of motion & Thyroid normal. Lungs: Normal respiratory effort; chest expands symmetrically. Lungs are clear to auscultation without rales, wheezes, or increased work of breathing. Heart: Normal rate and rhythm. Normal S1 and S2. No gallop, click, or rub. S4 w/o murmur. Abdomen: Bowel sounds normal; abdomen soft and nontender. No masses, organomegaly or hernias noted. Genitalia: Genitalia normal except for left varices. Prostate is small & firm without  asymmetry, nodularity, or induration.                                  Musculoskeletal/extremities: No  clubbing, cyanosis, edema, or significant extremity  deformity noted. Range of motion normal .Tone & strength  Normal. Joints normal . Nail health good. Able to lie down & sit up w/o help. Negative SLR bilaterally Vascular: Carotid, radial artery, dorsalis pedis and  posterior tibial pulses are full and equal. No bruits present. Neurologic: Alert and oriented x3. Deep tendon reflexes symmetrical but 0-1/2+ @ knees        Skin: Intact without suspicious lesions or rashes. Tag R inner thigh Lymph: No cervical, axillary, or inguinal lymphadenopathy present. Psych: Mood and affect are normal. Normally interactive                                                                                       Assessment & Plan:  #1 comprehensive physical exam; no acute findings  Plan: see Orders  & Recommendations

## 2012-10-06 ENCOUNTER — Ambulatory Visit (INDEPENDENT_AMBULATORY_CARE_PROVIDER_SITE_OTHER): Payer: BC Managed Care – PPO | Admitting: Family Medicine

## 2012-10-06 ENCOUNTER — Encounter: Payer: Self-pay | Admitting: Family Medicine

## 2012-10-06 VITALS — BP 130/82 | HR 105 | Temp 98.4°F | Ht 67.5 in | Wt 258.2 lb

## 2012-10-06 DIAGNOSIS — R112 Nausea with vomiting, unspecified: Secondary | ICD-10-CM

## 2012-10-06 LAB — CBC WITH DIFFERENTIAL/PLATELET
Basophils Absolute: 0 10*3/uL (ref 0.0–0.1)
Basophils Relative: 0.3 % (ref 0.0–3.0)
Eosinophils Absolute: 0.2 10*3/uL (ref 0.0–0.7)
Eosinophils Relative: 2.6 % (ref 0.0–5.0)
HCT: 43 % (ref 39.0–52.0)
Hemoglobin: 14.6 g/dL (ref 13.0–17.0)
Lymphocytes Relative: 27.3 % (ref 12.0–46.0)
Lymphs Abs: 2.1 10*3/uL (ref 0.7–4.0)
MCHC: 33.9 g/dL (ref 30.0–36.0)
MCV: 90.2 fl (ref 78.0–100.0)
Monocytes Absolute: 0.5 10*3/uL (ref 0.1–1.0)
Monocytes Relative: 6.7 % (ref 3.0–12.0)
Neutro Abs: 5 10*3/uL (ref 1.4–7.7)
Neutrophils Relative %: 63.1 % (ref 43.0–77.0)
Platelets: 280 10*3/uL (ref 150.0–400.0)
RBC: 4.77 Mil/uL (ref 4.22–5.81)
RDW: 13.5 % (ref 11.5–14.6)
WBC: 7.9 10*3/uL (ref 4.5–10.5)

## 2012-10-06 LAB — BASIC METABOLIC PANEL
BUN: 13 mg/dL (ref 6–23)
CO2: 30 mEq/L (ref 19–32)
Calcium: 9 mg/dL (ref 8.4–10.5)
Chloride: 105 mEq/L (ref 96–112)
Creatinine, Ser: 0.9 mg/dL (ref 0.4–1.5)
GFR: 116.77 mL/min (ref >60.00)
Glucose, Bld: 89 mg/dL (ref 70–99)
Potassium: 3.4 mEq/L — ABNORMAL LOW (ref 3.5–5.1)
Sodium: 141 mEq/L (ref 135–145)

## 2012-10-06 LAB — HEPATIC FUNCTION PANEL
ALT: 21 U/L (ref 0–53)
AST: 24 U/L (ref 0–37)
Albumin: 3.7 g/dL (ref 3.5–5.2)
Alkaline Phosphatase: 52 U/L (ref 39–117)
Bilirubin, Direct: 0 mg/dL (ref 0.0–0.3)
Total Bilirubin: 0.2 mg/dL — ABNORMAL LOW (ref 0.3–1.2)
Total Protein: 7.5 g/dL (ref 6.0–8.3)

## 2012-10-06 MED ORDER — ONDANSETRON 4 MG PO TBDP: 4.0000 mg | ORAL_TABLET | Freq: Three times a day (TID) | ORAL | Status: DC | PRN

## 2012-10-06 NOTE — Progress Notes (Signed)
  Subjective:    Patient ID: Gregory Owens, male    DOB: Apr 15, 1970, 42 y.o.   MRN: [id number]  HPI abd pain- started Friday evening.  + diarrhea, vomiting.  Has not eaten much since Friday.  Stools are starting to firm.  Used pepto multiple times daily.  Tm 100.4.  + abd cramping at this time.  + sick contacts.  Last vomited Friday.  abd pain is diffuse w/out localization.   Review of Systems For ROS see HPI     Objective:   Physical Exam  Vitals reviewed. Constitutional: He appears well-developed and well-nourished. No distress.  HENT:  Head: Normocephalic and atraumatic.  Neck: Neck supple.  Cardiovascular: Normal rate, regular rhythm, normal heart sounds and intact distal pulses.   Pulmonary/Chest: Effort normal and breath sounds normal. No respiratory distress. He has no wheezes. He has no rales.  Abdominal: Soft. Bowel sounds are normal. He exhibits no distension. There is no tenderness. There is no rebound and no guarding.  Lymphadenopathy:    He has no cervical adenopathy.  Skin: Skin is warm and dry.          Assessment & Plan:

## 2012-10-06 NOTE — Patient Instructions (Addendum)
This appears to be a virus and should improve w/ time Drink plenty of fluids Use the Zofran as needed for nausea- these dissolve under your tongue REST! Call with any questions or concerns Hang in there!

## 2012-10-07 NOTE — Assessment & Plan Note (Signed)
New.  Suspect viral illness but will get CBC to assess for possible bacterial infxn.  Check electrolytes to r/o dehydration.  zofran prn nausea.  Encouraged fluids and BRAT diet.  Reviewed supportive care and red flags that should prompt return.  Pt expressed understanding and is in agreement w/ plan.

## 2012-10-21 ENCOUNTER — Ambulatory Visit (INDEPENDENT_AMBULATORY_CARE_PROVIDER_SITE_OTHER): Payer: BC Managed Care – PPO | Admitting: *Deleted

## 2012-10-21 DIAGNOSIS — Z23 Encounter for immunization: Secondary | ICD-10-CM

## 2012-10-23 ENCOUNTER — Encounter: Payer: Self-pay | Admitting: Internal Medicine

## 2012-10-23 ENCOUNTER — Other Ambulatory Visit: Payer: Self-pay | Admitting: General Practice

## 2012-10-23 MED ORDER — GLUCOSE BLOOD VI STRP: ORAL_STRIP | Status: DC

## 2012-10-23 NOTE — Telephone Encounter (Signed)
Test strips changed to [facility] per pt request.

## 2012-10-28 ENCOUNTER — Ambulatory Visit: Payer: BC Managed Care – PPO

## 2013-01-07 ENCOUNTER — Other Ambulatory Visit: Payer: Self-pay | Admitting: Internal Medicine

## 2013-01-12 ENCOUNTER — Other Ambulatory Visit: Payer: Self-pay | Admitting: *Deleted

## 2013-01-12 MED ORDER — VENLAFAXINE HCL ER 150 MG PO CP24: ORAL_CAPSULE | ORAL | Status: DC

## 2013-01-12 NOTE — Telephone Encounter (Signed)
Effexor refilled per protocol. JG//CMA 

## 2013-02-08 ENCOUNTER — Other Ambulatory Visit: Payer: Self-pay | Admitting: Internal Medicine

## 2013-02-09 NOTE — Telephone Encounter (Signed)
Effexor refilled per protocol. JG//CMA

## 2013-04-19 ENCOUNTER — Encounter: Payer: Self-pay | Admitting: Internal Medicine

## 2013-07-06 ENCOUNTER — Other Ambulatory Visit: Payer: Self-pay

## 2013-07-06 MED ORDER — VENLAFAXINE HCL ER 150 MG PO CP24: ORAL_CAPSULE | ORAL | Status: DC

## 2013-07-30 ENCOUNTER — Encounter: Payer: Self-pay | Admitting: Interventional Cardiology

## 2013-10-08 ENCOUNTER — Other Ambulatory Visit: Payer: Self-pay

## 2013-10-08 MED ORDER — VENLAFAXINE HCL ER 150 MG PO CP24: ORAL_CAPSULE | ORAL | Status: DC

## 2013-10-09 ENCOUNTER — Telehealth: Payer: Self-pay | Admitting: Internal Medicine

## 2013-10-09 MED ORDER — VENLAFAXINE HCL ER 150 MG PO CP24: ORAL_CAPSULE | ORAL | Status: DC

## 2013-10-09 NOTE — Telephone Encounter (Signed)
Pt was wondering if Dr.Hopper can send in Effexor 150 mg (pt stated brand name for this med) to [facility] on [address] until he come in for the appt with Dr. [name] on 10/14/13. Please advise. Pt stated drug store request the refill but they said he need to make an appt with Dr. [name] first.

## 2013-10-09 NOTE — Telephone Encounter (Signed)
Pt was notified sent 30 day until he see md.../lmb

## 2013-10-14 ENCOUNTER — Ambulatory Visit: Payer: BC Managed Care – PPO | Admitting: Internal Medicine

## 2013-10-14 DIAGNOSIS — R4689 Other symptoms and signs involving appearance and behavior: Secondary | ICD-10-CM | POA: Insufficient documentation

## 2013-10-14 DIAGNOSIS — Z0289 Encounter for other administrative examinations: Secondary | ICD-10-CM

## 2013-10-27 ENCOUNTER — Encounter: Payer: Self-pay | Admitting: Internal Medicine

## 2013-11-03 ENCOUNTER — Encounter: Payer: Self-pay | Admitting: Internal Medicine

## 2013-11-03 MED ORDER — VENLAFAXINE HCL ER 150 MG PO CP24: ORAL_CAPSULE | ORAL | Status: DC

## 2013-11-04 ENCOUNTER — Other Ambulatory Visit (INDEPENDENT_AMBULATORY_CARE_PROVIDER_SITE_OTHER): Payer: BC Managed Care – PPO

## 2013-11-04 ENCOUNTER — Ambulatory Visit (INDEPENDENT_AMBULATORY_CARE_PROVIDER_SITE_OTHER): Payer: BC Managed Care – PPO | Admitting: Internal Medicine

## 2013-11-04 ENCOUNTER — Encounter: Payer: Self-pay | Admitting: Internal Medicine

## 2013-11-04 VITALS — BP 120/86 | HR 101 | Temp 98.2°F | Wt 278.5 lb

## 2013-11-04 DIAGNOSIS — R7303 Prediabetes: Secondary | ICD-10-CM

## 2013-11-04 DIAGNOSIS — R7309 Other abnormal glucose: Secondary | ICD-10-CM

## 2013-11-04 DIAGNOSIS — R000 Tachycardia, unspecified: Secondary | ICD-10-CM

## 2013-11-04 DIAGNOSIS — F411 Generalized anxiety disorder: Secondary | ICD-10-CM

## 2013-11-04 DIAGNOSIS — K219 Gastro-esophageal reflux disease without esophagitis: Secondary | ICD-10-CM

## 2013-11-04 DIAGNOSIS — R9431 Abnormal electrocardiogram [ECG] [EKG]: Secondary | ICD-10-CM

## 2013-11-04 DIAGNOSIS — Z23 Encounter for immunization: Secondary | ICD-10-CM

## 2013-11-04 LAB — CBC WITH DIFFERENTIAL/PLATELET
Basophils Absolute: 0 10*3/uL (ref 0.0–0.1)
Basophils Relative: 0.4 % (ref 0.0–3.0)
Eosinophils Absolute: 0.2 10*3/uL (ref 0.0–0.7)
Eosinophils Relative: 1.9 % (ref 0.0–5.0)
HCT: 43.9 % (ref 39.0–52.0)
Hemoglobin: 14.5 g/dL (ref 13.0–17.0)
Lymphocytes Relative: 28.4 % (ref 12.0–46.0)
Lymphs Abs: 2.8 10*3/uL (ref 0.7–4.0)
MCHC: 33 g/dL (ref 30.0–36.0)
MCV: 92.7 fl (ref 78.0–100.0)
Monocytes Absolute: 0.6 10*3/uL (ref 0.1–1.0)
Monocytes Relative: 6 % (ref 3.0–12.0)
Neutro Abs: 6.3 10*3/uL (ref 1.4–7.7)
Neutrophils Relative %: 63.3 % (ref 43.0–77.0)
Platelets: 278 10*3/uL (ref 150.0–400.0)
RBC: 4.74 Mil/uL (ref 4.22–5.81)
RDW: 12.9 % (ref 11.5–15.5)
WBC: 9.9 10*3/uL (ref 4.0–10.5)

## 2013-11-04 LAB — BASIC METABOLIC PANEL
BUN: 15 mg/dL (ref 6–23)
CO2: 27 mEq/L (ref 19–32)
Calcium: 8.9 mg/dL (ref 8.4–10.5)
Chloride: 102 mEq/L (ref 96–112)
Creatinine, Ser: 0.9 mg/dL (ref 0.4–1.5)
GFR: 111.92 mL/min (ref >60.00)
Glucose, Bld: 232 mg/dL — ABNORMAL HIGH (ref 70–99)
Potassium: 3.6 mEq/L (ref 3.5–5.1)
Sodium: 137 mEq/L (ref 135–145)

## 2013-11-04 LAB — T4, FREE: Free T4: 0.78 ng/dL (ref 0.60–1.60)

## 2013-11-04 LAB — HEMOGLOBIN A1C: Hgb A1c MFr Bld: 7.7 % — ABNORMAL HIGH (ref 4.6–6.5)

## 2013-11-04 LAB — TSH: TSH: 1.9 u[IU]/mL (ref 0.35–4.50)

## 2013-11-04 MED ORDER — VENLAFAXINE HCL ER 150 MG PO CP24: ORAL_CAPSULE | ORAL | Status: DC

## 2013-11-04 NOTE — Assessment & Plan Note (Signed)
BMET TFT EKG

## 2013-11-04 NOTE — Assessment & Plan Note (Signed)
Renew meds 

## 2013-11-04 NOTE — Assessment & Plan Note (Signed)
Essentially no change versus 2014 except rate slower.

## 2013-11-04 NOTE — Assessment & Plan Note (Signed)
CBC

## 2013-11-04 NOTE — Patient Instructions (Addendum)
Your next office appointment will be determined based upon review of your pending labs . Those instructions will be transmitted to you through My Chart Reflux of gastric acid may be asymptomatic as this may occur mainly during sleep.The triggers for reflux  include stress; the "aspirin family" ; alcohol; peppermint; and caffeine (coffee, tea, cola, and chocolate). The aspirin family would include aspirin and the nonsteroidal agents such as ibuprofen &  Naproxen. Tylenol would not cause reflux. If having symptoms ; food & drink should be avoided for @ least 2 hours before going to bed.    To prevent fast heart rate, avoid stimulants such as decongestants, diet pills, nicotine, or caffeine (coffee, tea, cola, or chocolate) to excess.  Cardiovascular exercise, this can be as simple a program as walking, is recommended 30-45 minutes 3-4 times per week. If you're not exercising you should take 6-8 weeks to build up to this level.  Take the EKG to any emergency room or preop visits. There are nonspecific changes; as long as there is no new change these are not clinically significant . If the old EKG is not available for comparison; it may result in unnecessary hospitalization for observation with significant unnecessary expense.

## 2013-11-04 NOTE — Progress Notes (Signed)
Pre visit review using our clinic review tool, if applicable. No additional management support is needed unless otherwise documented below in the visit note. 

## 2013-11-04 NOTE — Progress Notes (Signed)
   Subjective:    Patient ID: Gregory Owens,Gregory Owens male    DOB: 22-Jan-1971, 43 y.o.   MRN: [id number]  HPI   He is here for renewal of his generic Effexor. He feels this is very effective in treating his generalized anxiety disorder. Originally he was seen by Dr. Michail JewelsGary Cottle but has been stable on the same dose of medication for a prolonged period time  He has had some difficulty going to sleep; once asleep he does not wake up.  Sleep apnea is controlled after his uvulectomy although he does have some snoring.  Reflux is controlled by over-the-counter Nexium 20 mg. He does describe occasional dysphagia.  He is not on any specific diet. He does not exercise.  His last labs were 10/06/12. At that time his potassium was 3.4. Last lipids were July 2014. HDL was 32.8, LDL 100, triglycerides 110.     Review of Systems    He specifically denies significant anxiety, irritability, difficulty triaging, panic attacks, or depression.  He has no anorexia.  Unexplained weight loss, abdominal pain, significant dyspepsia,  melena, rectal bleeding, or persistently small caliber stools are denied.      Objective:   Physical Exam   Positive or pertinent physical findings include:  As per CDC Guidelines ,Epic documents severe obesity as being present . Status post uvulectomy without residual oropharyngeal crowding. S4 gallop with pulse rate of approximately 100 Massive abdomen without tenderness, masses, organomegaly.  General appearance : in no distress. Eyes: No conjunctival inflammation or scleral icterus is present. Oral exam: Dental hygiene is good. Lips and gums are healthy appearing.There is no oropharyngeal erythema or exudate noted.  Heart:  regular rhythm. S1 and S2 normal without murmur, click, rub or other extra sounds   Lungs:Chest clear to auscultation; no wheezes, rhonchi,rales ,or rubs present.No increased work of breathing.  Abdomen: bowel sounds normal, soft and  non-tender without masses, organomegaly or hernias noted.  No guarding or rebound.  Vascular : all pulses equal ; no bruits present. Skin:Warm & dry.  Intact without suspicious lesions or rashes ; no jaundice or tenting Lymphatic: No lymphadenopathy is noted about the head, neck, axilla              Assessment & Plan:  See Current Assessment & Plan in Problem List under specific Diagnosis

## 2013-11-06 ENCOUNTER — Other Ambulatory Visit: Payer: Self-pay | Admitting: Internal Medicine

## 2013-11-06 DIAGNOSIS — IMO0002 Reserved for concepts with insufficient information to code with codable children: Secondary | ICD-10-CM

## 2013-11-06 DIAGNOSIS — E1165 Type 2 diabetes mellitus with hyperglycemia: Secondary | ICD-10-CM

## 2013-11-06 MED ORDER — METFORMIN HCL 500 MG PO TABS: 500.0000 mg | ORAL_TABLET | Freq: Two times a day (BID) | ORAL | Status: DC

## 2013-11-08 ENCOUNTER — Encounter: Payer: Self-pay | Admitting: Internal Medicine

## 2013-11-09 MED ORDER — BLOOD GLUCOSE TEST VI STRP: ORAL_STRIP | Status: DC

## 2013-11-30 ENCOUNTER — Other Ambulatory Visit: Payer: Self-pay | Admitting: Internal Medicine

## 2013-11-30 MED ORDER — BLOOD GLUCOSE TEST VI STRP: ORAL_STRIP | Status: DC

## 2013-11-30 NOTE — Addendum Note (Signed)
Addended by: Lyanne CoANDREWS, Cordarious Zeek R on: 11/30/2013 08:02 AM   Modules accepted: Orders

## 2014-06-17 ENCOUNTER — Ambulatory Visit (INDEPENDENT_AMBULATORY_CARE_PROVIDER_SITE_OTHER): Payer: BLUE CROSS/BLUE SHIELD | Admitting: Internal Medicine

## 2014-06-17 ENCOUNTER — Encounter: Payer: Self-pay | Admitting: Internal Medicine

## 2014-06-17 VITALS — BP 132/78 | HR 103 | Temp 98.3°F | Wt 270.2 lb

## 2014-06-17 DIAGNOSIS — Z0000 Encounter for general adult medical examination without abnormal findings: Secondary | ICD-10-CM

## 2014-06-17 NOTE — Progress Notes (Signed)
Pre visit review using our clinic review tool, if applicable. No additional management support is needed unless otherwise documented below in the visit note. 

## 2014-06-17 NOTE — Progress Notes (Signed)
   Subjective:    Patient ID: Gregory Owens, male    DOB: May 04, 1970, 44 y.o.   MRN: [id number]  HPI He is here for a physical;acute issues denied.    He has not been on any specific diet. He has not been exercising. He has not been compliant with his metformin. He states it causes diarrhea. He had not been taking it after meals and has only taken it 6 times the last 6 months. Fasting blood sugars range 100-130. He denies any hypoglycemia. His last A1c on record was 7.7 in 11/04/13.  He does have some intermittent tingling of left ulnar area.  Since moving 2-3 months ago he has had pain in the right knee without associated effusion.  Despite omeprazole he does have some indigestion.  He may have had exposure to sexual transmitted diseases on more than one occasion in the last 6 months. His wife is requesting that he have STD testing.  Effexor has been effective.  Significant family history includes myocardial infarction in both the maternal grandmother & paternal grandmother @ 9960. Paternal grandfather had colon cancer.    Review of Systems  Polyuria, polyphagia, polydipsia absent.  There is no blurred vision, double vision, or loss of vision.   No postural dizziness noted. Denied are numbness, tingling, or burning of the extremities other than the LUE.  No nonhealing skin lesions present.  Weight is stable.   Chest pain, palpitations, tachycardia, exertional dyspnea, paroxysmal nocturnal dyspnea, claudication or edema are absent.  Unexplained weight loss, abdominal pain,  dysphagia, melena, rectal bleeding, or persistently small caliber stools are denied.     Objective:   Physical Exam Pertinent or positive findings include: BMI: 41.68.  Pattern alopecia.  Multiple skin tags around the neck.  S4 is present; heart sounds are slightly distant. Breath sounds are also slightly decreased. Abdomen is massive with associated panniculus.  Deep tendon reflexes are 0+ at  the knees.  He has slight crepitus of knees without associated effusion.  General appearance :adequately nourished; in no distress. Eyes: No conjunctival inflammation or scleral icterus is present. Oral exam:  Lips and gums are healthy appearing.There is no oropharyngeal erythema or exudate noted. Dental hygiene is good. Heart:  Normal rate and regular rhythm. S1 and S2 normal without gallop, murmur, click,or rub . Lungs:Chest clear to auscultation; no wheezes, rhonchi,rales ,or rubs present.No increased work of breathing.  Abdomen: bowel sounds normal, soft and non-tender without masses, organomegaly or hernias noted.  No guarding or rebound.  Genitourinary: Genitalia normal except for  varices in left scrotum.. Rectal tone is normal. The prostate is normal in size; there is no induration or nodule present. Vascular : all pulses equal ; no bruits present. Skin:Warm & dry.  Intact without suspicious lesions or rashes ; no tenting  Lymphatic: No lymphadenopathy is noted about the head, neck, axilla, or inguinal areas.  Neuro: Strength, tone  normal.         Assessment & Plan:  #1 comprehensive physical exam; no acute findings  Plan: see Orders  & Recommendations

## 2014-06-17 NOTE — Patient Instructions (Signed)
  Your next office appointment will be determined based upon review of your pending labs.  Those instructions will be transmitted to you by My Chart Critical results will be called.   Followup as needed for any active or acute issue. Please report any significant change in your symptoms.  I recommend you see Dr Terrilee FilesZach Smith, Sports Medicine specialist., phone # [phone],for the knee issues.

## 2014-07-14 ENCOUNTER — Other Ambulatory Visit: Payer: Self-pay | Admitting: Internal Medicine

## 2014-07-22 LAB — HM DIABETES EYE EXAM

## 2014-09-10 ENCOUNTER — Encounter: Payer: Self-pay | Admitting: Internal Medicine

## 2014-11-10 ENCOUNTER — Encounter: Payer: Self-pay | Admitting: Internal Medicine

## 2014-11-11 ENCOUNTER — Encounter: Payer: Self-pay | Admitting: Internal Medicine

## 2014-11-12 ENCOUNTER — Other Ambulatory Visit: Payer: Self-pay | Admitting: Emergency Medicine

## 2014-11-12 MED ORDER — VENLAFAXINE HCL ER 150 MG PO CP24: 150.0000 mg | ORAL_CAPSULE | Freq: Every day | ORAL | Status: DC

## 2014-11-17 ENCOUNTER — Telehealth: Payer: Self-pay | Admitting: Internal Medicine

## 2014-11-17 NOTE — Telephone Encounter (Signed)
Pt request to transfer from Dr. Alwyn RenHopper to Dr. Jonny RuizJohn. Please advise.

## 2014-11-18 NOTE — Telephone Encounter (Signed)
Ok with me 

## 2014-11-18 NOTE — Telephone Encounter (Signed)
Got scheduled  °

## 2014-12-09 ENCOUNTER — Ambulatory Visit: Payer: BLUE CROSS/BLUE SHIELD | Admitting: Internal Medicine

## 2014-12-09 DIAGNOSIS — Z0289 Encounter for other administrative examinations: Secondary | ICD-10-CM

## 2015-03-02 ENCOUNTER — Other Ambulatory Visit: Payer: Self-pay | Admitting: Internal Medicine

## 2015-03-02 NOTE — Telephone Encounter (Signed)
Done x 90 days  Needs ROV for further refills please

## 2015-03-02 NOTE — Telephone Encounter (Signed)
Patient has scheduled appointment to transfer on 2/9

## 2015-03-02 NOTE — Telephone Encounter (Signed)
Please advise, ok to refill? 

## 2015-03-03 MED ORDER — VENLAFAXINE HCL ER 150 MG PO CP24: ORAL_CAPSULE | ORAL | Status: DC

## 2015-03-10 ENCOUNTER — Encounter: Payer: Self-pay | Admitting: Internal Medicine

## 2015-03-10 ENCOUNTER — Ambulatory Visit (INDEPENDENT_AMBULATORY_CARE_PROVIDER_SITE_OTHER): Payer: BLUE CROSS/BLUE SHIELD | Admitting: Internal Medicine

## 2015-03-10 VITALS — BP 132/84 | HR 120 | Temp 98.7°F | Resp 20 | Ht 68.0 in | Wt 276.0 lb

## 2015-03-10 DIAGNOSIS — L989 Disorder of the skin and subcutaneous tissue, unspecified: Secondary | ICD-10-CM

## 2015-03-10 DIAGNOSIS — Z0000 Encounter for general adult medical examination without abnormal findings: Secondary | ICD-10-CM | POA: Diagnosis not present

## 2015-03-10 DIAGNOSIS — Z23 Encounter for immunization: Secondary | ICD-10-CM

## 2015-03-10 DIAGNOSIS — Z0001 Encounter for general adult medical examination with abnormal findings: Secondary | ICD-10-CM | POA: Insufficient documentation

## 2015-03-10 DIAGNOSIS — IMO0001 Reserved for inherently not codable concepts without codable children: Secondary | ICD-10-CM

## 2015-03-10 DIAGNOSIS — E1165 Type 2 diabetes mellitus with hyperglycemia: Secondary | ICD-10-CM

## 2015-03-10 DIAGNOSIS — F411 Generalized anxiety disorder: Secondary | ICD-10-CM

## 2015-03-10 DIAGNOSIS — G4733 Obstructive sleep apnea (adult) (pediatric): Secondary | ICD-10-CM

## 2015-03-10 MED ORDER — EFFEXOR XR 150 MG PO CP24: ORAL_CAPSULE | ORAL | Status: DC

## 2015-03-10 NOTE — Progress Notes (Signed)
Subjective:    Patient ID: Gregory Owens, male    DOB: Oct 30, 1970, 45 y.o.   MRN: [id number]  HPI Here for wellness and f/u;  Overall doing ok;  Pt denies Chest pain, worsening SOB, DOE, wheezing, orthopnea, PND, worsening LE edema, palpitations, dizziness or syncope.  Pt denies neurological change such as new headache, facial or extremity weakness.  Pt denies polydipsia, polyuria, or low sugar symptoms. Pt states overall good compliance with treatment and medications, good tolerability, and has been trying to follow appropriate diet.  Pt denies worsening depressive symptoms, suicidal ideation or panic. No fever, night sweats, wt loss, loss of appetite, or other constitutional symptoms.  Pt states good ability with ADL's, has low fall risk, home safety reviewed and adequate, no other significant changes in hearing or vision, and only occasionally active with exercise. Does take the align occasionally at home. Wt Readings from Last 3 Encounters:  03/10/15 276 lb (125.193 kg)  06/17/14 270 lb 4 oz (122.585 kg)  11/04/13 278 lb 8 oz (126.327 kg)  Due for flu shot.  Effexor Brand name seems to work well for his GAD, needs 90 days.  Has hx of OSA and asks for f/u Past Medical History  Diagnosis Date  . IBS (irritable bowel syndrome)   . Abnormal EKG     NS ST-T EKG changes with negative Stress cardiolite 2003  . Anxiety   . Hyperlipidemia   . Hyperglycemia   . Dehydration 2005    [place] , [place]  . OSA (obstructive sleep apnea)     resolved post surgery  . Diabetes mellitus without complication [facility])    Past Surgical History  Procedure Laterality Date  . Uvulopalatopharyngoplasty  2004    Post op bleeding complication;  Dr Jearld Fenton  . Tonsillectomy and adenoidectomy       with above  . Wisdom tooth extraction      reports that he has never smoked. He has never used smokeless tobacco. He reports that he does not drink alcohol or use illicit drugs. family history includes Cirrhosis  in his brother; Colon cancer in his paternal grandfather; Diabetes in his maternal grandmother and paternal grandmother; Heart attack (age of onset: 10) in his maternal grandmother and paternal grandmother; Hyperlipidemia in his father and mother; Hypertension in his father and mother; Other in his mother. Not on File  Review of Systems Constitutional: Negative for increased diaphoresis, other activity, appetite or siginficant weight change other than noted HENT: Negative for worsening hearing loss, ear pain, facial swelling, mouth sores and neck stiffness.   Eyes: Negative for other worsening pain, redness or visual disturbance.  Respiratory: Negative for shortness of breath and wheezing  Cardiovascular: Negative for chest pain and palpitations.  Gastrointestinal: Negative for diarrhea, blood in stool, abdominal distention or other pain Genitourinary: Negative for hematuria, flank pain or change in urine volume.  Musculoskeletal: Negative for myalgias or other joint complaints.  Skin: Negative for color change and wound or drainage.  Neurological: Negative for syncope and numbness. other than noted Hematological: Negative for adenopathy. or other swelling Psychiatric/Behavioral: Negative for hallucinations, SI, self-injury, decreased concentration or other worsening agitation.      Objective:   Physical Exam BP 132/84 mmHg  Pulse 120  Temp(Src) 98.7 F (37.1 C) (Oral)  Resp 20  Ht  (1.727 m)  Wt 276 lb (125.193 kg)  BMI 41.98 kg/m2  SpO2 95% VS noted, morbid obese Constitutional: Pt is oriented to person, place, and time. Appears  well-developed and well-nourished, in no significant distress Head: Normocephalic and atraumatic.  Right Ear: External ear normal.  Left Ear: External ear normal.  Nose: Nose normal.  Mouth/Throat: Oropharynx is clear and moist.  Eyes: Conjunctivae and EOM are normal. Pupils are equal, round, and reactive to light.  Neck: Normal range of motion.  Neck supple. No JVD present. No tracheal deviation present or significant neck LA or mass Cardiovascular: Normal rate, regular rhythm, normal heart sounds and intact distal pulses.   Pulmonary/Chest: Effort normal and breath sounds without rales or wheezing  Abdominal: Soft. Bowel sounds are normal. NT. No HSM  Musculoskeletal: Normal range of motion. Exhibits no edema.  Lymphadenopathy:  Has no cervical adenopathy.  Neurological: Pt is alert and oriented to person, place, and time. Pt has normal reflexes. No cranial nerve deficit. Motor grossly intact Skin: Skin is warm and dry. No rash noted. , mult skin tags to neck Psychiatric:  Has normal mood and affect. Behavior is normal.     Assessment & Plan:

## 2015-03-10 NOTE — Progress Notes (Signed)
Pre visit review using our clinic review tool, if applicable. No additional management support is needed unless otherwise documented below in the visit note. 

## 2015-03-10 NOTE — Patient Instructions (Addendum)
You had the flu shot today, and the Pneumovax shot today  Please continue all other medications as before, and refills have been done if requested - the brand name Effexor  Please have the pharmacy call with any other refills you may need.  Please continue your efforts at being more active, low cholesterol diet, and weight control.  You are otherwise up to date with prevention measures today.  Please keep your appointments with your specialists as you may have planned  You will be contacted regarding the referral for: Diabetes Education, Pulmonary, as well as Dermatology  You will be contacted by phone if any changes need to be made immediately.  Otherwise, you will receive a letter about your results with an explanation, but please check with MyChart first.  Please remember to sign up for MyChart if you have not done so, as this will be important to you in the future with finding out test results, communicating by private email, and scheduling acute appointments online when needed.  Please return in 6 months, or sooner if needed, with Lab testing done 3-5 days before

## 2015-03-12 NOTE — Assessment & Plan Note (Signed)
Due for f/u, for pulm referral,  to f/u any worsening symptoms or concerns

## 2015-03-12 NOTE — Assessment & Plan Note (Addendum)
Control unclear, stable overall by history and exam, recent data reviewed with pt, and pt to continue medical treatment as before,  to f/u any worsening symptoms or concerns, for f/u lab, referral DM education

## 2015-03-12 NOTE — Assessment & Plan Note (Signed)
stable overall by history and exam, recent data reviewed with pt, and pt to continue medical treatment as before,  to f/u any worsening symptoms or concerns Lab Results  Component Value Date   WBC 9.9 11/04/2013   HGB 14.5 11/04/2013   HCT 43.9 11/04/2013   PLT 278.0 11/04/2013   GLUCOSE 232* 11/04/2013   CHOL 155 08/15/2012   TRIG 110.0 08/15/2012   HDL 32.80* 08/15/2012   LDLDIRECT 127.1 08/25/2009   LDLCALC 100* 08/15/2012   ALT 21 10/06/2012   AST 24 10/06/2012   NA 137 11/04/2013   K 3.6 11/04/2013   CL 102 11/04/2013   CREATININE 0.9 11/04/2013   BUN 15 11/04/2013   CO2 27 11/04/2013   TSH 1.90 11/04/2013   HGBA1C 7.7* 11/04/2013   MICROALBUR 1.0 12/25/2011   Declines counseling referral

## 2015-03-12 NOTE — Assessment & Plan Note (Signed)
Ok for derm referral,  to f/u any worsening symptoms or concerns 

## 2015-03-12 NOTE — Assessment & Plan Note (Signed)

## 2015-03-18 ENCOUNTER — Encounter: Payer: BLUE CROSS/BLUE SHIELD | Attending: Internal Medicine | Admitting: Dietician

## 2015-03-18 ENCOUNTER — Encounter: Payer: Self-pay | Admitting: Dietician

## 2015-03-18 VITALS — Ht 68.0 in | Wt 270.0 lb

## 2015-03-18 DIAGNOSIS — E1165 Type 2 diabetes mellitus with hyperglycemia: Secondary | ICD-10-CM | POA: Diagnosis present

## 2015-03-18 DIAGNOSIS — E119 Type 2 diabetes mellitus without complications: Secondary | ICD-10-CM

## 2015-03-18 NOTE — Patient Instructions (Signed)
Rethink your beverage!  Drink more water. Aim for 30 minutes of exercise per day most days of the week and increase as tolerated.  Choose something that you enjoy. Consider Weight Watchers for support Aim for 4 Carb Choices per meal (60 grams) +/- 1 either way  Aim for 0-2 Carbs per snack if hungry  Include protein in moderation with your meals and snacks Consider reading food labels for Total Carbohydrate and Fat Grams of foods Consider  increasing your activity level by walking or Zumba for 30-60 minutes daily as tolerated Consider checking BG at alternate times per day as directed by MD

## 2015-03-18 NOTE — Progress Notes (Signed)
Medical Nutrition Therapy:  Appt start time: 1430 end time:  1600.   Assessment:  Primary concerns today: Patient is here today to learn more about diabetes control and lose weight.  His mother and wife have been in Weight Watchers before and he is considering joining.  He has done this in the past.  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  He saw Meriam Sprague, RD 06/03/2012 after diagnosis of diabetes.  History includes OSA and anxiety.  He does not use C-pap and is to see a Pulmonologist next week related to sleep issues.  He is not checking his blood sugar.  HgbA1C 7.7%  And GFR 111.9 11/04/13.    Weight hx: Highest 278 lbs 2 years ago Low 220 lbs Today 270 lbs  Patient lives with his wife.  His twin brother died 4-5 years ago which greatly effected his life.  Both shop and cook but often just eat out or grab and go foods.  They do not have much meal planning although have tried more recently.  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  He does not exercise but likes to walk and wants to try Zumba in particular if he can find a class with others who are overweight.  Preferred Learning Style:   No preference indicated   Learning Readiness:   Ready  MEDICATIONS: see list   DIETARY INTAKE:  Usual eating pattern includes 3 meals and 3 snacks per day. Everyday foods include fast food.     24-hr recall:  B (11 AM): [place] (sausage biscuit), regular pepsi  Snk ( AM): none  L (4 PM): Fast food ([employer] chicken biscuit or Chik fil-A with small fries and regular soda) Snk ( PM): vending machine (peanut butter crackers or cheese its or Little Debbie) D (8 PM): occasional cook grilled or baked chicken, veges, starch, bread or crackers OR Frozen pizza OR other convenience foods Snk ( PM): sugar or plain with sugar cereal, yogurt or ice cream or sherbet Beverages: regular soda, coke or pepsi zero, diet dr. Reino Kent, sweet tea occasionally,  occasional coffee, juice occasional  Usual physical activity: ADL's  Estimated energy needs: 1800 calories 200 g carbohydrates 113 g protein 60 g fat  Progress Towards Goal(s):  In progress.   Nutritional Diagnosis:  NB-1.1 Food and nutrition-related knowledge deficit As related to balance of carbohydrate, protein, and fat.  As evidenced by diet hx and patient report.    Intervention:  Nutrition counseling and diabetes education initiated. Discussed Carb Counting by food group as method of portion control, reading food labels, and benefits of increased activity. Also discussed basic physiology of Diabetes, target BG ranges pre and post meals, and A1c. Recommendations appropriate for weight loss.  Rethink your beverage!  Drink more water. Aim for 30 minutes of exercise per day most days of the week and increase as tolerated.  Choose something that you enjoy. Consider Weight Watchers for support (patient's idea) Aim for 4 Carb Choices per meal (60 grams) +/- 1 either way  Aim for 0-2 Carbs per snack if hungry  Include protein in moderation with your meals and snacks Consider reading food labels for Total Carbohydrate and Fat Grams of foods Consider  increasing your activity level by walking or Zumba for 30-60 minutes daily as tolerated Consider checking BG at alternate times per day as directed by MD   Teaching Method Utilized:  Visual Auditory Hands on  Handouts given during visit  include: Living Well with Diabetes Carb Counting and Food Label handouts Meal Plan Card Breakfast idas  A1C Sheet  Snack list  Label reading  Barriers to learning/adherence to lifestyle change: none  Demonstrated degree of understanding via:  Teach Back   Monitoring/Evaluation:  Dietary intake, exercise, label reading, and body weight in 1 month(s).

## 2015-03-30 ENCOUNTER — Other Ambulatory Visit (INDEPENDENT_AMBULATORY_CARE_PROVIDER_SITE_OTHER): Payer: BLUE CROSS/BLUE SHIELD

## 2015-03-30 DIAGNOSIS — E1165 Type 2 diabetes mellitus with hyperglycemia: Secondary | ICD-10-CM | POA: Diagnosis not present

## 2015-03-30 DIAGNOSIS — Z0000 Encounter for general adult medical examination without abnormal findings: Secondary | ICD-10-CM

## 2015-03-30 DIAGNOSIS — IMO0001 Reserved for inherently not codable concepts without codable children: Secondary | ICD-10-CM

## 2015-03-30 LAB — URINALYSIS, ROUTINE W REFLEX MICROSCOPIC
Bilirubin Urine: NEGATIVE
Hgb urine dipstick: NEGATIVE
Ketones, ur: NEGATIVE
Leukocytes, UA: NEGATIVE
Nitrite: NEGATIVE
Specific Gravity, Urine: >=1.03 — AB
Total Protein, Urine: NEGATIVE
Urine Glucose: NEGATIVE
Urobilinogen, UA: 0.2
pH: 6 (ref 5.0–8.0)

## 2015-03-30 LAB — LIPID PANEL
Cholesterol: 179 mg/dL (ref 0–200)
HDL: 34.5 mg/dL — ABNORMAL LOW (ref >39.00)
LDL Cholesterol: 116 mg/dL — ABNORMAL HIGH (ref 0–99)
NonHDL: 144.13
Total CHOL/HDL Ratio: 5
Triglycerides: 141 mg/dL (ref 0.0–149.0)
VLDL: 28.2 mg/dL (ref 0.0–40.0)

## 2015-03-30 LAB — CBC WITH DIFFERENTIAL/PLATELET
Basophils Absolute: 0 10*3/uL (ref 0.0–0.1)
Basophils Relative: 0.3 % (ref 0.0–3.0)
Eosinophils Absolute: 0.2 10*3/uL (ref 0.0–0.7)
Eosinophils Relative: 1.8 % (ref 0.0–5.0)
HCT: 42.8 % (ref 39.0–52.0)
Hemoglobin: 14.5 g/dL (ref 13.0–17.0)
Lymphocytes Relative: 34.5 % (ref 12.0–46.0)
Lymphs Abs: 2.8 10*3/uL (ref 0.7–4.0)
MCHC: 33.8 g/dL (ref 30.0–36.0)
MCV: 90.9 fl (ref 78.0–100.0)
Monocytes Absolute: 0.5 10*3/uL (ref 0.1–1.0)
Monocytes Relative: 6.7 % (ref 3.0–12.0)
Neutro Abs: 4.6 10*3/uL (ref 1.4–7.7)
Neutrophils Relative %: 56.7 % (ref 43.0–77.0)
Platelets: 295 10*3/uL (ref 150.0–400.0)
RBC: 4.71 Mil/uL (ref 4.22–5.81)
RDW: 12.7 % (ref 11.5–15.5)
WBC: 8.2 10*3/uL (ref 4.0–10.5)

## 2015-03-30 LAB — BASIC METABOLIC PANEL
BUN: 15 mg/dL (ref 6–23)
CO2: 29 mEq/L (ref 19–32)
Calcium: 9.2 mg/dL (ref 8.4–10.5)
Chloride: 104 mEq/L (ref 96–112)
Creatinine, Ser: 0.86 mg/dL (ref 0.40–1.50)
GFR: 123.24 mL/min (ref >60.00)
Glucose, Bld: 154 mg/dL — ABNORMAL HIGH (ref 70–99)
Potassium: 4 mEq/L (ref 3.5–5.1)
Sodium: 140 mEq/L (ref 135–145)

## 2015-03-30 LAB — MICROALBUMIN / CREATININE URINE RATIO
Creatinine,U: 268.8 mg/dL
Microalb Creat Ratio: 0.4 mg/g (ref 0.0–30.0)
Microalb, Ur: 1.2 mg/dL (ref 0.0–1.9)

## 2015-03-30 LAB — HEPATIC FUNCTION PANEL
ALT: 20 U/L (ref 0–53)
AST: 18 U/L (ref 0–37)
Albumin: 4 g/dL (ref 3.5–5.2)
Alkaline Phosphatase: 66 U/L (ref 39–117)
Bilirubin, Direct: 0.1 mg/dL (ref 0.0–0.3)
Total Bilirubin: 0.4 mg/dL (ref 0.2–1.2)
Total Protein: 7.2 g/dL (ref 6.0–8.3)

## 2015-03-30 LAB — PSA: PSA: 0.66 ng/mL (ref 0.10–4.00)

## 2015-03-30 LAB — TSH: TSH: 1.77 u[IU]/mL (ref 0.35–4.50)

## 2015-03-30 LAB — HEMOGLOBIN A1C: Hgb A1c MFr Bld: 8.1 % — ABNORMAL HIGH (ref 4.6–6.5)

## 2015-03-31 ENCOUNTER — Institutional Professional Consult (permissible substitution): Payer: BLUE CROSS/BLUE SHIELD | Admitting: Pulmonary Disease

## 2015-04-04 ENCOUNTER — Other Ambulatory Visit: Payer: Self-pay | Admitting: Internal Medicine

## 2015-04-04 MED ORDER — ASPIRIN EC 81 MG PO TBEC: 81.0000 mg | DELAYED_RELEASE_TABLET | Freq: Every day | ORAL | Status: DC

## 2015-04-04 MED ORDER — METFORMIN HCL ER 500 MG PO TB24: 1000.0000 mg | ORAL_TABLET | Freq: Every day | ORAL | Status: DC

## 2015-04-06 ENCOUNTER — Encounter: Payer: Self-pay | Admitting: Internal Medicine

## 2015-04-11 ENCOUNTER — Encounter: Payer: Self-pay | Admitting: Internal Medicine

## 2015-04-12 MED ORDER — GLUCOSE BLOOD VI STRP: ORAL_STRIP | Status: DC

## 2015-04-12 NOTE — Addendum Note (Signed)
Addended by: Radford PaxAIRRIKIER DAVIDSON, Marleen Moret M on: 04/12/2015 09:05 AM   Modules accepted: Orders

## 2015-04-15 ENCOUNTER — Encounter: Payer: BLUE CROSS/BLUE SHIELD | Attending: Internal Medicine | Admitting: Dietician

## 2015-04-15 DIAGNOSIS — E1165 Type 2 diabetes mellitus with hyperglycemia: Secondary | ICD-10-CM | POA: Diagnosis present

## 2015-04-15 DIAGNOSIS — E669 Obesity, unspecified: Secondary | ICD-10-CM

## 2015-04-15 DIAGNOSIS — E118 Type 2 diabetes mellitus with unspecified complications: Secondary | ICD-10-CM

## 2015-04-15 NOTE — Progress Notes (Signed)
Medical Nutrition Therapy:  Appt start time: 1300 end time:  1335.   Assessment: 03/18/15  Primary concerns today: Patient is here today to learn more about diabetes control and lose weight.  His mother and wife have been in Weight Watchers before and he is considering joining.  He has done this in the past.  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  He saw Meriam SpragueBeverly, RD 06/03/2012 after diagnosis of diabetes.  History includes OSA and anxiety.  He does not use C-pap and is to see a Pulmonologist next week related to sleep issues.  He is not checking his blood sugar.  HgbA1C 7.7%  And GFR 111.9 11/04/13.    Weight hx: Highest 278 lbs 2 years ago Low 220 lbs 03/18/15:  270 lbs.  Decreased to 267 lbs today. 04/15/15  Patient lives with his wife.  His twin brother died 4-5 years ago which greatly effected his life.  Both shop and cook but often just eat out or grab and go foods.  They do not have much meal planning although have tried more recently.  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  He does not exercise but likes to walk and wants to try Zumba in particular if he can find a class with others who are overweight.  04/15/15: He has made multiple changes.  He has changed to grilled meat instead of fried, started exercising and stopped drinking sugar sweetened beverages.  He has joined [employer] and finds that this is helpful.  His last HgbA1C was 8,1% 03/30/15 but still reflects the time of increased carb and fat intake >1 month ago. His wife is here today as well and appears supportive.  They still do not have a lot of time for meal prep but have done this at times.  He has not started checking his blood sugar but states that he should have strips ready to pick up at the pharmacy.  He has been craving carbohydrates more this week and has not been able to exercise as often.  Preferred Learning Style:   No preference indicated   Learning  Readiness:   Ready  MEDICATIONS: see list   DIETARY INTAKE:  Usual eating pattern includes 3 meals and 3 snacks per day. Everyday foods include fast food.     24-hr recall:  B (11 AM): Chicken biscuit at [employer] Snk ( AM): none  L (4 PM): Grilled chicken bites, Polynesian sauce, 1/2 small fries, fruit from [employer] Snk ( PM): Rare Little Debbie D (8 PM): occasional cook grilled or baked chicken, veges, starch, bread or crackers OR Frozen pizza OR other convenience foods Snk ( PM): yes but decreased sugar sweetened choices. Beverages: coke or pepsi zero or diet lemonade,   Usual physical activity: Now has fit bit and is working on an initial goal of 7,000 steps daily.  (30 minutes 2-3 days per week.).  Wants to try Zumba  Estimated energy needs: 1800 calories 200 g carbohydrates 113 g protein 60 g fat  Progress Towards Goal(s):  In progress.   Nutritional Diagnosis:  NB-1.1 Food and nutrition-related knowledge deficit As related to balance of carbohydrate, protein, and fat.  As evidenced by diet hx and patient report.    Intervention:  Nutrition counseling and diabetes education continued. Discussed continued label reading and decision making when eating out. Discussed beverage choices and importance of nutritional quality of diet.  Check that your Metformin is the extended  release.  Discuss this with the doctor if you are still having diarrhea and cramps. Pick up your strips Get back on the exercise routine.  Stay as active as possible on the changes that you have made! Try an infuser bottle to increase your water intake without all of the artificial sweeteners. Increase your vegetable intake.  Aim for 4 Carb Choices per meal (60 grams) +/- 1 either way  Aim for 0-2 Carbs per snack if hungry   Teaching Method Utilized:  Visual Auditory Hands on  Handouts given during visit include: Living Well with Diabetes Carb Counting and Food Label handouts Meal Plan  Card Breakfast idas  A1C Sheet  Snack list  Label reading  Barriers to learning/adherence to lifestyle change: none  Demonstrated degree of understanding via:  Teach Back   Monitoring/Evaluation:  Dietary intake, exercise, label reading, and body weight in 1 month(s).

## 2015-04-15 NOTE — Patient Instructions (Signed)
Check that your Metformin is the extended release.  Discuss this with the doctor if you are still having diarrhea and cramps. Pick up your strips Get back on the exercise routine.  Stay as active as possible on the changes that you have made! Try an infuser bottle to increase your water intake without all of the artificial sweeteners. Increase your vegetable intake.

## 2015-04-19 ENCOUNTER — Telehealth: Payer: Self-pay

## 2015-04-19 MED ORDER — GLUCOSE BLOOD VI STRP: ORAL_STRIP | Status: DC

## 2015-04-19 NOTE — Telephone Encounter (Signed)
Medication sent.

## 2015-04-20 ENCOUNTER — Telehealth: Payer: Self-pay | Admitting: *Deleted

## 2015-04-20 MED ORDER — GLUCOSE BLOOD VI STRP: 1.0000 | ORAL_STRIP | Freq: Two times a day (BID) | Status: DC

## 2015-04-20 MED ORDER — ONETOUCH ULTRASOFT LANCETS MISC: 1.0000 | Freq: Two times a day (BID) | Status: DC

## 2015-04-20 MED ORDER — BAYER MICROLET LANCETS MISC: Status: DC

## 2015-04-20 MED ORDER — BAYER CONTOUR NEXT MONITOR W/DEVICE KIT: PACK | Status: DC

## 2015-04-20 NOTE — Telephone Encounter (Signed)
Left msg on triage statingnhe has sent email stating pharmaacy states they did not receive script for testing strips. [facility].Called pt back to verify meter pt states he need one touch ultra 2. Inform per chart [facility] [employer] [facility] [address]. Pt stated he never told them he use that pharmacy he  [facility]./lmb

## 2015-04-20 NOTE — Addendum Note (Signed)
Addended by: Deatra JamesBRAND, Malasia Torain M on: 04/20/2015 04:57 PM   Modules accepted: Orders, Medications

## 2015-04-20 NOTE — Telephone Encounter (Signed)
Resent for contour monitor w/supplies...Raechel Chute/lmb

## 2015-04-20 NOTE — Telephone Encounter (Signed)
Patient called to advise that [facility]. The ultra 2 is the wrong thing. They are advising that what needs to be sent is the contour next testing meter, lancets, and testing strips.

## 2015-05-13 ENCOUNTER — Ambulatory Visit: Payer: BLUE CROSS/BLUE SHIELD | Admitting: Dietician

## 2015-07-05 ENCOUNTER — Encounter: Payer: Self-pay | Admitting: Internal Medicine

## 2015-07-15 ENCOUNTER — Encounter: Payer: Self-pay | Admitting: Dietician

## 2015-07-15 ENCOUNTER — Encounter: Payer: BLUE CROSS/BLUE SHIELD | Attending: Internal Medicine | Admitting: Dietician

## 2015-07-15 VITALS — Wt 267.0 lb

## 2015-07-15 DIAGNOSIS — E1165 Type 2 diabetes mellitus with hyperglycemia: Secondary | ICD-10-CM | POA: Diagnosis present

## 2015-07-15 DIAGNOSIS — E118 Type 2 diabetes mellitus with unspecified complications: Secondary | ICD-10-CM

## 2015-07-15 DIAGNOSIS — IMO0002 Reserved for concepts with insufficient information to code with codable children: Secondary | ICD-10-CM

## 2015-07-15 NOTE — Patient Instructions (Signed)
Make exercise fun. Your wife can help motivate you to exercise but when walking keep it fun.  Enjoy nature, conversation, avoid any comments regarding competition.  Just get the habit going.  Increasing the intensity will come in time.  Continue to be mindful about your choices when eating out. Consider using the Calorie Brooke DareKing app to make healthier choices when eating out. Increase your intake of non starchy vegetables.  https://www.bernard.org/ChooseMyPlate.gov EatRight.org

## 2015-07-15 NOTE — Progress Notes (Signed)
Medical Nutrition Therapy:  Appt start time: 1300 end time:  1335.   Assessment: 03/18/15  Primary concerns today: Patient is here today to learn more about diabetes control and lose weight.  His mother and wife have been in Weight Watchers before and he is considering joining.  He has done this in the past.  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  He saw Meriam SpragueBeverly, RD 06/03/2012 after diagnosis of diabetes.  History includes OSA and anxiety.  He does not use C-pap and is to see a Pulmonologist next week related to sleep issues.  He is not checking his blood sugar.  HgbA1C 7.7%  And GFR 111.9 11/04/13.    Weight hx: Highest 278 lbs 2 years ago Low 220 lbs 03/18/15:  270 lbs.  Decreased to 267 lbs 04/15/15 and stable 07/15/15.  Patient lives with his wife.  His twin brother died 4-5 years ago which greatly effected his life.  Both shop and cook but often just eat out or grab and go foods.  They do not have much meal planning although have tried more recently.  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  He does not exercise but likes to walk and wants to try Zumba in particular if he can find a class with others who are overweight.  04/15/15: He has made multiple changes.  He has changed to grilled meat instead of fried, started exercising and stopped drinking sugar sweetened beverages.  He has joined [employer] and finds that this is helpful.  His last HgbA1C was 8,1% 03/30/15 but still reflects the time of increased carb and fat intake >1 month ago. His wife is here today as well and appears supportive.  They still do not have a lot of time for meal prep but have done this at times.  He has not started checking his blood sugar but states that he should have strips ready to pick up at the pharmacy.  He has been craving carbohydrates more this week and has not been able to exercise as often.  07/15/15: Patient is here alone.  His weight is unchanged at  267 lbs today.  He states that he had lost to 262 but then regained.  He reports having problems with consistency and habit change.  He is having problems exercising with his wife due to negative competition.  He was walking for 3 weeks but is not now due to this.  He reports a lack of discipline in all areas of his life.  He continues to eat out very frequently.  He and his wife are talking about the need to eat more at home.  He reports decreasing sugary drinks but still has them most mornings and is choosing fewer biscuits and choosing English Muffins instead. We discussed that wife although lovingly is critical in her attempt to motivate and encourage.  Preferred Learning Style:   No preference indicated   Learning Readiness:   Ready  MEDICATIONS: see list   DIETARY INTAKE:    Usual eating pattern includes 3 meals and 3 snacks per day. Everyday foods include fast food.     24-hr recall: (2nd visit) B (11 AM): Chicken biscuit at [employer] Snk ( AM): none  L (4 PM): Grilled chicken bites, [place]yn[place] sauce, 1/2 small fries, fruit from [employer] Snk ( PM): Rare Little Debbie D (8 PM): occasional cook grilled or baked chicken, veges, starch, bread or crackers OR Frozen pizza OR  other convenience foods Snk ( PM): yes but decreased sugar sweetened choices. Beverages: coke or pepsi zero or diet lemonade,   Usual physical activity: Now has fit bit and is working on an initial goal of 7,000 steps daily.  (30 minutes 2-3 days per week.).  Wants to try Zumba  Estimated energy needs: 1800 calories 200 g carbohydrates 113 g protein 60 g fat  Progress Towards Goal(s):  In progress.   Nutritional Diagnosis:  NB-1.1 Food and nutrition-related knowledge deficit As related to balance of carbohydrate, protein, and fat.  As evidenced by diet hx and patient report.    Intervention:  Nutrition counseling and diabetes education continued. Discussed continued label reading and decision making when  eating out. Discussed beverage choices and importance of nutritional quality of diet.  Check that your Metformin is the extended release.  Discuss this with the doctor if you are still having diarrhea and cramps. Pick up your strips Get back on the exercise routine.  Stay as active as possible on the changes that you have made! Try an infuser bottle to increase your water intake without all of the artificial sweeteners. Increase your vegetable intake.  Aim for 4 Carb Choices per meal (60 grams) +/- 1 either way  Aim for 0-2 Carbs per snack if hungry   Teaching Method Utilized:  Visual Auditory Hands on  Handouts given during visit include: (initial visit) Living Well with Diabetes Carb Counting and Food Label handouts Meal Plan Card Breakfast idas  A1C Sheet  Snack list  Label reading  Barriers to learning/adherence to lifestyle change: none  Demonstrated degree of understanding via:  Teach Back   Monitoring/Evaluation:  Dietary intake, exercise, label reading, and body weight in 1 month(s).

## 2015-08-12 ENCOUNTER — Ambulatory Visit: Payer: BLUE CROSS/BLUE SHIELD | Admitting: Dietician

## 2015-10-11 DIAGNOSIS — L918 Other hypertrophic disorders of the skin: Secondary | ICD-10-CM | POA: Insufficient documentation

## 2015-10-11 DIAGNOSIS — L219 Seborrheic dermatitis, unspecified: Secondary | ICD-10-CM | POA: Insufficient documentation

## 2015-12-02 ENCOUNTER — Telehealth: Payer: Self-pay | Admitting: Internal Medicine

## 2015-12-02 NOTE — Telephone Encounter (Signed)
Pt was due for ROV in sept 2017  Please make ROV and any labs can be done at that time

## 2015-12-02 NOTE — Telephone Encounter (Signed)
Patient is requesting A1C check without appt.  Patient would like to do this on 11/7 when he comes for his flu shot.  Please advise.

## 2015-12-02 NOTE — Telephone Encounter (Signed)
Sent patient my chart message with response.

## 2015-12-06 ENCOUNTER — Encounter: Payer: Self-pay | Admitting: Internal Medicine

## 2015-12-06 ENCOUNTER — Ambulatory Visit: Payer: BLUE CROSS/BLUE SHIELD

## 2015-12-06 ENCOUNTER — Other Ambulatory Visit (INDEPENDENT_AMBULATORY_CARE_PROVIDER_SITE_OTHER): Payer: BLUE CROSS/BLUE SHIELD

## 2015-12-06 ENCOUNTER — Ambulatory Visit (INDEPENDENT_AMBULATORY_CARE_PROVIDER_SITE_OTHER): Payer: BLUE CROSS/BLUE SHIELD | Admitting: Internal Medicine

## 2015-12-06 VITALS — BP 136/78 | HR 100 | Temp 97.7°F | Resp 20 | Wt 273.0 lb

## 2015-12-06 DIAGNOSIS — E1165 Type 2 diabetes mellitus with hyperglycemia: Secondary | ICD-10-CM

## 2015-12-06 DIAGNOSIS — E785 Hyperlipidemia, unspecified: Secondary | ICD-10-CM | POA: Diagnosis not present

## 2015-12-06 DIAGNOSIS — Z0001 Encounter for general adult medical examination with abnormal findings: Secondary | ICD-10-CM

## 2015-12-06 DIAGNOSIS — IMO0001 Reserved for inherently not codable concepts without codable children: Secondary | ICD-10-CM

## 2015-12-06 LAB — HEMOGLOBIN A1C: Hgb A1c MFr Bld: 7 % — ABNORMAL HIGH (ref 4.6–6.5)

## 2015-12-06 MED ORDER — ROSUVASTATIN CALCIUM 10 MG PO TABS: 10.0000 mg | ORAL_TABLET | Freq: Every day | ORAL | 3 refills | Status: DC

## 2015-12-06 MED ORDER — LOSARTAN POTASSIUM 25 MG PO TABS: 25.0000 mg | ORAL_TABLET | Freq: Every day | ORAL | 3 refills | Status: DC

## 2015-12-06 NOTE — Progress Notes (Signed)
Subjective:    Patient ID: Gregory Owens, male    DOB: [birth year], 45 y.o.   MRN: [id number]  HPI  Here to f/u; overall doing ok,  Pt denies chest pain, increasing sob or doe, wheezing, orthopnea, PND, increased LE swelling, palpitations, dizziness or syncope.  Pt denies new neurological symptoms such as new headache, or facial or extremity weakness or numbness.  Pt denies polydipsia, polyuria, or low sugar episode.   Pt denies new neurological symptoms such as new headache, or facial or extremity weakness or numbness.   Pt states overall good compliance with meds, mostly trying to follow appropriate diet, with wt overall stable,  but little exercise however. Tolerating new metformin well.  Has not been to DM education. Plans to start exercise at gym  Twin brother died recently with autoimmune hepatitis at 31. Past Medical History:  Diagnosis Date  . Abnormal EKG    NS ST-T EKG changes with negative Stress cardiolite 2003  . Anxiety   . Dehydration 2005   [place] , [place]  . Diabetes mellitus without complication ([place])   . Hyperglycemia   . Hyperlipidemia   . IBS (irritable bowel syndrome)   . OSA (obstructive sleep apnea)    resolved post surgery   Past Surgical History:  Procedure Laterality Date  . TONSILLECTOMY AND ADENOIDECTOMY      with above  . UVULOPALATOPHARYNGOPLASTY  2004   Post op bleeding complication;  Dr Janace Hoard  . WISDOM TOOTH EXTRACTION      reports that he has never smoked. He has never used smokeless tobacco. He reports that he does not drink alcohol or use drugs. family history includes Cirrhosis in his brother; Colon cancer in his paternal grandfather; Diabetes in his maternal grandmother and paternal grandmother; Heart attack (age of onset: 35) in his maternal grandmother and paternal grandmother; Hyperlipidemia in his father and mother; Hypertension in his father and mother; Other in his mother. Not on File Current Outpatient Prescriptions on File Prior to  Visit  Medication Sig Dispense Refill  . aspirin EC 81 MG tablet Take 1 tablet (81 mg total) by mouth daily. 90 tablet 11  . BAYER MICROLET LANCETS lancets Use to help check blood sugar daily Dx E11.9 100 each 3  . Blood Glucose Monitoring Suppl (BAYER CONTOUR NEXT MONITOR) w/Device KIT Use as directed to check blood sugars daily Dx e11.9 1 kit 0  . cetirizine (ZYRTEC) 10 MG tablet Take 10 mg by mouth daily.      . EFFEXOR XR 150 MG 24 hr capsule TAKE 1 CAPSULE (150 MG TOTAL) BY MOUTH DAILY. 90 capsule 3  . glucose blood (BAYER CONTOUR NEXT TEST) test strip 1 each by Other route 2 (two) times daily. Use to check blood sugars twice a day Dx E11.9 100 each 3  . lactobacillus acidophilus (BACID) TABS tablet Take 2 tablets by mouth 3 (three) times daily.    . metFORMIN (GLUCOPHAGE-XR) 500 MG 24 hr tablet Take 2 tablets (1,000 mg total) by mouth daily with breakfast. 180 tablet 3   No current facility-administered medications on file prior to visit.     Review of Systems  Constitutional: Negative for unusual diaphoresis or night sweats HENT: Negative for ear swelling or discharge Eyes: Negative for worsening visual haziness  Respiratory: Negative for choking and stridor.   Gastrointestinal: Negative for distension or worsening eructation Genitourinary: Negative for retention or change in urine volume.  Musculoskeletal: Negative for other MSK pain or swelling Skin: Negative for  color change and worsening wound Neurological: Negative for tremors and numbness other than noted  Psychiatric/Behavioral: Negative for decreased concentration or agitation other than above   All other system neg per pt    Objective:   Physical Exam BP 136/78   Pulse 100   Temp 97.7 F (36.5 C) (Oral)   Resp 20   Wt 273 lb (123.8 kg)   SpO2 97%   BMI 41.51 kg/m  VS noted,  Constitutional: Pt appears in no apparent distress HENT: Head: NCAT.  Right Ear: External ear normal.  Left Ear: External ear normal.   Eyes: . Pupils are equal, round, and reactive to light. Conjunctivae and EOM are normal Neck: Normal range of motion. Neck supple.  Cardiovascular: Normal rate and regular rhythm.   Pulmonary/Chest: Effort normal and breath sounds without rales or wheezing.  Neurological: Pt is alert. Not confused , motor grossly intact Skin: Skin is warm. No rash, no LE edema Psychiatric: Pt behavior is normal. No agitation.     Assessment & Plan:

## 2015-12-06 NOTE — Progress Notes (Signed)
Pre visit review using our clinic review tool, if applicable. No additional management support is needed unless otherwise documented below in the visit note. 

## 2015-12-06 NOTE — Patient Instructions (Signed)
Please take all new medication as prescribed - the low dose generic crestor for cholesterol, and losartan to help protect the kidneys  You will be contacted regarding the referral for: Diabetes Education  Please continue all other medications as before, and refills have been done if requested.  Please have the pharmacy call with any other refills you may need.  Please continue your efforts at being more active, low cholesterol diabetic diet, and weight control.  Please keep your appointments with your specialists as you may have planned  Please go to the LAB in the Basement (turn left off the elevator) for the tests to be done today  You will be contacted by phone if any changes need to be made immediately.  Otherwise, you will receive a letter about your results with an explanation, but please check with MyChart first.  Please remember to sign up for MyChart if you have not done so, as this will be important to you in the future with finding out test results, communicating by private email, and scheduling acute appointments online when needed.  Please return in 6 months, or sooner if needed, with Lab testing done 3-5 days before

## 2015-12-07 LAB — HEPATIC FUNCTION PANEL
ALT: 18 U/L (ref 0–53)
AST: 15 U/L (ref 0–37)
Albumin: 4.1 g/dL (ref 3.5–5.2)
Alkaline Phosphatase: 64 U/L (ref 39–117)
Bilirubin, Direct: 0.1 mg/dL (ref 0.0–0.3)
Total Bilirubin: 0.2 mg/dL (ref 0.2–1.2)
Total Protein: 7.7 g/dL (ref 6.0–8.3)

## 2015-12-07 LAB — LIPID PANEL
Cholesterol: 178 mg/dL (ref 0–200)
HDL: 32.7 mg/dL — ABNORMAL LOW (ref >39.00)
NonHDL: 145.34
Total CHOL/HDL Ratio: 5
Triglycerides: 247 mg/dL — ABNORMAL HIGH (ref 0.0–149.0)
VLDL: 49.4 mg/dL — ABNORMAL HIGH (ref 0.0–40.0)

## 2015-12-07 LAB — BASIC METABOLIC PANEL
BUN: 17 mg/dL (ref 6–23)
CO2: 31 mEq/L (ref 19–32)
Calcium: 9.5 mg/dL (ref 8.4–10.5)
Chloride: 100 mEq/L (ref 96–112)
Creatinine, Ser: 0.89 mg/dL (ref 0.40–1.50)
GFR: 118.1 mL/min (ref >60.00)
Glucose, Bld: 119 mg/dL — ABNORMAL HIGH (ref 70–99)
Potassium: 3.7 mEq/L (ref 3.5–5.1)
Sodium: 138 mEq/L (ref 135–145)

## 2015-12-07 LAB — LDL CHOLESTEROL, DIRECT: Direct LDL: 114 mg/dL

## 2015-12-09 ENCOUNTER — Telehealth: Payer: Self-pay | Admitting: Internal Medicine

## 2015-12-09 ENCOUNTER — Encounter: Payer: Self-pay | Admitting: Internal Medicine

## 2015-12-09 NOTE — Telephone Encounter (Signed)
No, thanks 

## 2015-12-09 NOTE — Telephone Encounter (Signed)
I noticed the referral for him was to Nutrition and Diabetes Education. I called the pt to let him know that there may have been a mix up because we sent the referral to [facility]. He states he already sees someone for nutrition and education.  Do you want him to see an endocrinologist?  Please advise

## 2015-12-09 NOTE — Telephone Encounter (Signed)
Pt aware.

## 2015-12-11 NOTE — Assessment & Plan Note (Addendum)
stable overall by history and exam, recent data reviewed with pt, and pt to continue medical treatment as before,  to f/u any worsening symptoms or concerns Lab Results  Component Value Date   HGBA1C 7.0 (H) 12/06/2015   Also for losartan 25 for renoprotective, also refer DM education

## 2015-12-11 NOTE — Assessment & Plan Note (Signed)
D/w pt for increased activity/gym for best results at wt loss, for reduced calories

## 2015-12-11 NOTE — Assessment & Plan Note (Signed)
Uncontrolled, to add crestor 10 qd,  to f/u any worsening symptoms or concerns

## 2015-12-16 ENCOUNTER — Ambulatory Visit: Payer: BLUE CROSS/BLUE SHIELD | Admitting: Endocrinology

## 2016-03-06 ENCOUNTER — Other Ambulatory Visit: Payer: Self-pay | Admitting: Internal Medicine

## 2016-03-28 ENCOUNTER — Ambulatory Visit (INDEPENDENT_AMBULATORY_CARE_PROVIDER_SITE_OTHER): Payer: BLUE CROSS/BLUE SHIELD | Admitting: Internal Medicine

## 2016-03-28 ENCOUNTER — Encounter: Payer: Self-pay | Admitting: Internal Medicine

## 2016-03-28 VITALS — BP 120/78 | HR 108 | Temp 97.9°F | Wt 269.0 lb

## 2016-03-28 DIAGNOSIS — G4761 Periodic limb movement disorder: Secondary | ICD-10-CM

## 2016-03-28 DIAGNOSIS — F909 Attention-deficit hyperactivity disorder, unspecified type: Secondary | ICD-10-CM

## 2016-03-28 DIAGNOSIS — K219 Gastro-esophageal reflux disease without esophagitis: Secondary | ICD-10-CM | POA: Diagnosis not present

## 2016-03-28 DIAGNOSIS — G4733 Obstructive sleep apnea (adult) (pediatric): Secondary | ICD-10-CM

## 2016-03-28 MED ORDER — PANTOPRAZOLE SODIUM 40 MG PO TBEC: 40.0000 mg | DELAYED_RELEASE_TABLET | Freq: Every day | ORAL | 3 refills | Status: DC

## 2016-03-28 MED ORDER — PRAMIPEXOLE DIHYDROCHLORIDE 0.25 MG PO TABS: 0.2500 mg | ORAL_TABLET | Freq: Every day | ORAL | 11 refills | Status: DC

## 2016-03-28 NOTE — Patient Instructions (Signed)
OK to stop the nexium  Please take all new medication as prescribed - the protonix  Please consider trying the Mirapex at night for the leg movements  You will be contacted regarding the referral for: ADHD clinic  Please continue all other medications as before, and refills have been done if requested.  Please have the pharmacy call with any other refills you may need.  Please keep your appointments with your specialists as you may have planned

## 2016-03-28 NOTE — Assessment & Plan Note (Signed)
Improved s/p surgury per ENT, no daytime somnolence, does not need specific further eval or tx

## 2016-03-28 NOTE — Progress Notes (Signed)
Pre visit review using our clinic review tool, if applicable. No additional management support is needed unless otherwise documented below in the visit note. 

## 2016-03-28 NOTE — Assessment & Plan Note (Signed)
Uncontrolled, for antireflux diet, change nexium otc to protonix 40 qd, consider GI for persistent symptoms

## 2016-03-28 NOTE — Assessment & Plan Note (Signed)
With worsening symptoms, for referral to ADHD clinic,  to f/u any worsening symptoms or concerns

## 2016-03-28 NOTE — Assessment & Plan Note (Signed)
Ok for trial mirapex 0.25 mg qhs,  to f/u any worsening symptoms or concerns

## 2016-03-28 NOTE — Progress Notes (Signed)
Subjective:    Patient ID: Gregory Owens, male    DOB: [birth year], 46 y.o.   MRN: [id number]  HPI  Here to f/u after recent several days of significant heartburn reflux with sour brash with increased fatty diet last wk, despite otc nexium 20 qd; did not try maalox or TUMS or similar.  Denies worsening other abd pain, dysphagia, n/v, bowel change or blood except for a particularly severe episode dull CP with eating, resolved with regurgitation of food.  No recent wt loss.  Pt denies other chest pain, increased sob or doe, wheezing, orthopnea, PND, increased LE swelling, palpitations, dizziness or syncope. Pt denies new neurological symptoms such as new headache, or facial or extremity weakness or numbness   Pt denies polydipsia, polyuria.  Does mention also he has known what seems c/w PLMD and wife has been complaining about his moving legs nights.  He has hx of OSA but improved and did not need cpap after nasal septal surgury with uvulectomy.  Also asks for ADHD referral due to worsening symptoms and need for tx. Past Medical History:  Diagnosis Date  . Abnormal EKG    NS ST-T EKG changes with negative Stress cardiolite 2003  . Anxiety   . Dehydration 2005   [place] , [place]  . Diabetes mellitus without complication ([place])   . Hyperglycemia   . Hyperlipidemia   . IBS (irritable bowel syndrome)   . OSA (obstructive sleep apnea)    resolved post surgery   Past Surgical History:  Procedure Laterality Date  . TONSILLECTOMY AND ADENOIDECTOMY      with above  . UVULOPALATOPHARYNGOPLASTY  2004   Post op bleeding complication;  Dr Janace Hoard  . WISDOM TOOTH EXTRACTION      reports that he has never smoked. He has never used smokeless tobacco. He reports that he does not drink alcohol or use drugs. family history includes Cirrhosis in his brother; Colon cancer in his paternal grandfather; Diabetes in his maternal grandmother and paternal grandmother; Heart attack (age of onset: 72) in his  maternal grandmother and paternal grandmother; Hyperlipidemia in his father and mother; Hypertension in his father and mother; Other in his mother. Not on File Current Outpatient Prescriptions on File Prior to Visit  Medication Sig Dispense Refill  . aspirin EC 81 MG tablet Take 1 tablet (81 mg total) by mouth daily. 90 tablet 11  . BAYER MICROLET LANCETS lancets Use to help check blood sugar daily Dx E11.9 100 each 3  . Blood Glucose Monitoring Suppl (BAYER CONTOUR NEXT MONITOR) w/Device KIT Use as directed to check blood sugars daily Dx e11.9 1 kit 0  . cetirizine (ZYRTEC) 10 MG tablet Take 10 mg by mouth daily.      . EFFEXOR XR 150 MG 24 hr capsule TAKE 1 CAPSULE (150 MG TOTAL) BY MOUTH DAILY. 90 capsule 1  . glucose blood (BAYER CONTOUR NEXT TEST) test strip 1 each by Other route 2 (two) times daily. Use to check blood sugars twice a day Dx E11.9 100 each 3  . lactobacillus acidophilus (BACID) TABS tablet Take 2 tablets by mouth 3 (three) times daily.    [name] losartan (COZAAR) 25 MG tablet Take 1 tablet (25 mg total) by mouth daily. 90 tablet 3  . metFORMIN (GLUCOPHAGE-XR) 500 MG 24 hr tablet Take 2 tablets (1,000 mg total) by mouth daily with breakfast. 180 tablet 3  . rosuvastatin (CRESTOR) 10 MG tablet Take 1 tablet (10 mg total) by mouth daily. [place]  tablet 3   No current facility-administered medications on file prior to visit.    Review of Systems  Constitutional: Negative for unusual diaphoresis or night sweats HENT: Negative for ear swelling or discharge Eyes: Negative for worsening visual haziness  Respiratory: Negative for choking and stridor.   Gastrointestinal: Negative for distension or worsening eructation Genitourinary: Negative for retention or change in urine volume.  Musculoskeletal: Negative for other MSK pain or swelling Skin: Negative for color change and worsening wound Neurological: Negative for tremors and numbness other than noted  Psychiatric/Behavioral:  Negative for decreased concentration or agitation other than above   All other system neg per pt    Objective:   Physical Exam BP 120/78   Pulse (!) 108   Temp 97.9 F (36.6 C)   Wt 269 lb (122 kg)   SpO2 95%   BMI 40.90 kg/m  VS noted,  Constitutional: Pt appears in no apparent distress HENT: Head: NCAT.  Right Ear: External ear normal.  Left Ear: External ear normal.  Eyes: . Pupils are equal, round, and reactive to light. Conjunctivae and EOM are normal Neck: Normal range of motion. Neck supple.  Cardiovascular: Normal rate and regular rhythm.   Pulmonary/Chest: Effort normal and breath sounds without rales or wheezing.  Abd:  Soft, NT, ND, + BS Neurological: Pt is alert. Not confused , motor grossly intact Skin: Skin is warm. No rash, no LE edema Psychiatric: Pt behavior is normal. No agitation.  No other new exam findings    Assessment & Plan:

## 2016-04-23 ENCOUNTER — Other Ambulatory Visit: Payer: Self-pay | Admitting: Internal Medicine

## 2016-04-24 ENCOUNTER — Other Ambulatory Visit: Payer: Self-pay | Admitting: Internal Medicine

## 2016-04-25 ENCOUNTER — Other Ambulatory Visit (INDEPENDENT_AMBULATORY_CARE_PROVIDER_SITE_OTHER): Payer: BLUE CROSS/BLUE SHIELD

## 2016-04-25 ENCOUNTER — Ambulatory Visit (INDEPENDENT_AMBULATORY_CARE_PROVIDER_SITE_OTHER): Payer: BLUE CROSS/BLUE SHIELD | Admitting: Internal Medicine

## 2016-04-25 ENCOUNTER — Encounter: Payer: Self-pay | Admitting: Internal Medicine

## 2016-04-25 VITALS — BP 116/78 | HR 97 | Temp 98.7°F | Ht 68.0 in | Wt 271.0 lb

## 2016-04-25 DIAGNOSIS — Z0000 Encounter for general adult medical examination without abnormal findings: Secondary | ICD-10-CM | POA: Diagnosis not present

## 2016-04-25 DIAGNOSIS — E1165 Type 2 diabetes mellitus with hyperglycemia: Secondary | ICD-10-CM | POA: Diagnosis not present

## 2016-04-25 DIAGNOSIS — IMO0001 Reserved for inherently not codable concepts without codable children: Secondary | ICD-10-CM

## 2016-04-25 LAB — BASIC METABOLIC PANEL
BUN: 14 mg/dL (ref 6–23)
CO2: 30 mEq/L (ref 19–32)
Calcium: 9.8 mg/dL (ref 8.4–10.5)
Chloride: 101 mEq/L (ref 96–112)
Creatinine, Ser: 0.83 mg/dL (ref 0.40–1.50)
GFR: 127.79 mL/min (ref >60.00)
Glucose, Bld: 138 mg/dL — ABNORMAL HIGH (ref 70–99)
Potassium: 3.9 mEq/L (ref 3.5–5.1)
Sodium: 139 mEq/L (ref 135–145)

## 2016-04-25 LAB — LIPID PANEL
Cholesterol: 142 mg/dL (ref 0–200)
HDL: 37.3 mg/dL — ABNORMAL LOW (ref >39.00)
NonHDL: 104.34
Total CHOL/HDL Ratio: 4
Triglycerides: 223 mg/dL — ABNORMAL HIGH (ref 0.0–149.0)
VLDL: 44.6 mg/dL — ABNORMAL HIGH (ref 0.0–40.0)

## 2016-04-25 LAB — LDL CHOLESTEROL, DIRECT: Direct LDL: 76 mg/dL

## 2016-04-25 LAB — HEPATIC FUNCTION PANEL
ALT: 19 U/L (ref 0–53)
AST: 14 U/L (ref 0–37)
Albumin: 4.3 g/dL (ref 3.5–5.2)
Alkaline Phosphatase: 73 U/L (ref 39–117)
Bilirubin, Direct: 0.1 mg/dL (ref 0.0–0.3)
Total Bilirubin: 0.3 mg/dL (ref 0.2–1.2)
Total Protein: 7.4 g/dL (ref 6.0–8.3)

## 2016-04-25 LAB — URINALYSIS, ROUTINE W REFLEX MICROSCOPIC
Bilirubin Urine: NEGATIVE
Hgb urine dipstick: NEGATIVE
Ketones, ur: NEGATIVE
Leukocytes, UA: NEGATIVE
Nitrite: NEGATIVE
RBC / HPF: NONE SEEN (ref 0–?)
Specific Gravity, Urine: 1.025 (ref 1.000–1.030)
Total Protein, Urine: NEGATIVE
Urine Glucose: 250 — AB
Urobilinogen, UA: 0.2 (ref 0.0–1.0)
WBC, UA: NONE SEEN (ref 0–?)
pH: 6.5 (ref 5.0–8.0)

## 2016-04-25 LAB — CBC WITH DIFFERENTIAL/PLATELET
Basophils Absolute: 0 10*3/uL (ref 0.0–0.1)
Basophils Relative: 0.3 % (ref 0.0–3.0)
Eosinophils Absolute: 0.2 10*3/uL (ref 0.0–0.7)
Eosinophils Relative: 1.9 % (ref 0.0–5.0)
HCT: 45 % (ref 39.0–52.0)
Hemoglobin: 14.8 g/dL (ref 13.0–17.0)
Lymphocytes Relative: 28.8 % (ref 12.0–46.0)
Lymphs Abs: 2.9 10*3/uL (ref 0.7–4.0)
MCHC: 32.9 g/dL (ref 30.0–36.0)
MCV: 91.4 fl (ref 78.0–100.0)
Monocytes Absolute: 0.7 10*3/uL (ref 0.1–1.0)
Monocytes Relative: 6.9 % (ref 3.0–12.0)
Neutro Abs: 6.2 10*3/uL (ref 1.4–7.7)
Neutrophils Relative %: 62.1 % (ref 43.0–77.0)
Platelets: 322 10*3/uL (ref 150.0–400.0)
RBC: 4.92 Mil/uL (ref 4.22–5.81)
RDW: 13.6 % (ref 11.5–15.5)
WBC: 9.9 10*3/uL (ref 4.0–10.5)

## 2016-04-25 LAB — PSA: PSA: 0.76 ng/mL (ref 0.10–4.00)

## 2016-04-25 LAB — TSH: TSH: 2.23 u[IU]/mL (ref 0.35–4.50)

## 2016-04-25 LAB — MICROALBUMIN / CREATININE URINE RATIO
Creatinine,U: 185.4 mg/dL
Microalb Creat Ratio: 0.5 mg/g (ref 0.0–30.0)
Microalb, Ur: 1 mg/dL (ref 0.0–1.9)

## 2016-04-25 LAB — HEMOGLOBIN A1C: Hgb A1c MFr Bld: 7.4 % — ABNORMAL HIGH (ref 4.6–6.5)

## 2016-04-25 NOTE — Patient Instructions (Addendum)
Please make your yearly eye doctor appt  Please continue all other medications as before, and refills have been done if requested.  Please have the pharmacy call with any other refills you may need.  Please continue your efforts at being more active, low cholesterol diet, and weight control.  You are otherwise up to date with prevention measures today.  Please keep your appointments with your specialists as you may have planned  Please go to the LAB in the Basement (turn left off the elevator) for the tests to be done today  You will be contacted by phone if any changes need to be made immediately.  Otherwise, you will receive a letter about your results with an explanation, but please check with MyChart first.  Please remember to sign up for MyChart if you have not done so, as this will be important to you in the future with finding out test results, communicating by private email, and scheduling acute appointments online when needed.  Please return in 6 months, or sooner if needed, with Lab testing done 3-5 days before

## 2016-04-25 NOTE — Progress Notes (Signed)
   Subjective:    Patient ID: Gregory Owens, male    DOB: 1970/03/24, 46 y.o.   MRN: [id number]  HPI     Wt Readings from Last 3 Encounters:  04/25/16 271 lb (122.9 kg)  03/28/16 269 lb (122 kg)  12/06/15 273 lb (123.8 kg)  Due for optho f/u    Review of Systems     Objective:   Physical Exam BP 116/78   Pulse 97   Temp 98.7 F (37.1 C) (Oral)   Ht 5\' 8"  (1.727 m)   Wt 271 lb (122.9 kg)   SpO2 97%   BMI 41.21 kg/m    ECG today I have personally interpreted Sinus  Rhythm  Low voltage in precordial leads.   -Incomplete right bundle branch block.   -  Diffuse nonspecific T-abnormality     Assessment & Plan:

## 2016-04-25 NOTE — Assessment & Plan Note (Signed)
stable overall by history and exam, recent data reviewed with pt, and pt to continue medical treatment as before,  to f/u any worsening symptoms or concerns For f/u a1c, diet, wt loss

## 2016-04-25 NOTE — Assessment & Plan Note (Signed)

## 2016-04-25 NOTE — Progress Notes (Signed)
Pre visit review using our clinic review tool, if applicable. No additional management support is needed unless otherwise documented below in the visit note. 

## 2016-05-05 ENCOUNTER — Other Ambulatory Visit: Payer: Self-pay | Admitting: Internal Medicine

## 2016-06-05 ENCOUNTER — Other Ambulatory Visit: Payer: Self-pay | Admitting: Internal Medicine

## 2016-08-05 ENCOUNTER — Other Ambulatory Visit: Payer: Self-pay | Admitting: Internal Medicine

## 2016-08-16 DIAGNOSIS — F4321 Adjustment disorder with depressed mood: Secondary | ICD-10-CM | POA: Diagnosis not present

## 2016-08-28 ENCOUNTER — Ambulatory Visit (INDEPENDENT_AMBULATORY_CARE_PROVIDER_SITE_OTHER): Payer: BLUE CROSS/BLUE SHIELD | Admitting: Internal Medicine

## 2016-08-28 ENCOUNTER — Encounter: Payer: Self-pay | Admitting: Internal Medicine

## 2016-08-28 VITALS — BP 118/86 | HR 105 | Ht 68.0 in | Wt 272.0 lb

## 2016-08-28 DIAGNOSIS — E1165 Type 2 diabetes mellitus with hyperglycemia: Secondary | ICD-10-CM

## 2016-08-28 DIAGNOSIS — K219 Gastro-esophageal reflux disease without esophagitis: Secondary | ICD-10-CM

## 2016-08-28 DIAGNOSIS — R1310 Dysphagia, unspecified: Secondary | ICD-10-CM

## 2016-08-28 DIAGNOSIS — IMO0001 Reserved for inherently not codable concepts without codable children: Secondary | ICD-10-CM

## 2016-08-28 NOTE — Progress Notes (Signed)
Subjective:    Patient ID: Gregory Owens, male    DOB: [birth year], 46 y.o.   MRN: [id number]  HPI  Here to f/u with c/o 2 mo worsening lower sternal burning pain as well as worsening low chest area dysphagia where 4 times in past 2 months he has had difficulty passing something like bread, and ended up each time improved with vomiting.  Denies worsening abd pain, n/v, bowel change or blood.  Overall good compliance with treatment, and good medicine tolerability, including the PPI.  Nothing seems to make better or worse including diet change.  No wt loss, fever, ST, cough, and Pt denies other chest pain, increased sob or doe, wheezing, orthopnea, PND, increased LE swelling, palpitations, dizziness or syncope.  . Pt denies polydipsia, polyuria,     Past Medical History:  Diagnosis Date  . Abnormal EKG    NS ST-T EKG changes with negative Stress cardiolite 2003  . Anxiety   . Dehydration 2005   [place] , [place]  . Diabetes mellitus without complication ([place])   . Hyperglycemia   . Hyperlipidemia   . IBS (irritable bowel syndrome)   . OSA (obstructive sleep apnea)    resolved post surgery   Past Surgical History:  Procedure Laterality Date  . TONSILLECTOMY AND ADENOIDECTOMY      with above  . UVULOPALATOPHARYNGOPLASTY  2004   Post op bleeding complication;  Dr Janace Hoard  . WISDOM TOOTH EXTRACTION      reports that he has never smoked. He has never used smokeless tobacco. He reports that he does not drink alcohol or use drugs. family history includes Cirrhosis in his brother; Colon cancer in his paternal grandfather; Diabetes in his maternal grandmother and paternal grandmother; Heart attack (age of onset: 27) in his maternal grandmother and paternal grandmother; Hyperlipidemia in his father and mother; Hypertension in his father and mother; Other in his mother. Not on File Current Outpatient Prescriptions on File Prior to Visit  Medication Sig Dispense Refill  . aspirin 81 MG EC  tablet TAKE 1 TABLET (81 MG TOTAL) BY MOUTH DAILY. 90 tablet 3  . BAYER CONTOUR NEXT TEST test strip USE TO CHECK BLOOD SUGAR 2 TIMES DAILY 100 each 1  . BAYER MICROLET LANCETS lancets Use to help check blood sugar daily Dx E11.9 100 each 3  . Blood Glucose Monitoring Suppl (BAYER CONTOUR NEXT MONITOR) w/Device KIT Use as directed to check blood sugars daily Dx e11.9 1 kit 0  . cetirizine (ZYRTEC) 10 MG tablet Take 10 mg by mouth daily.      . EFFEXOR XR 150 MG 24 hr capsule TAKE 1 CAPSULE (150 MG TOTAL) BY MOUTH DAILY. 90 capsule 2  . lactobacillus acidophilus (BACID) TABS tablet Take 2 tablets by mouth 3 (three) times daily.    [name] losartan (COZAAR) 25 MG tablet Take 1 tablet (25 mg total) by mouth daily. 90 tablet 3  . metFORMIN (GLUCOPHAGE-XR) 500 MG 24 hr tablet TAKE 2 TABLETS (1,000 MG TOTAL) BY MOUTH DAILY WITH BREAKFAST. 180 tablet 3  . pantoprazole (PROTONIX) 40 MG tablet Take 1 tablet (40 mg total) by mouth daily. 90 tablet 3  . rosuvastatin (CRESTOR) 10 MG tablet Take 1 tablet (10 mg total) by mouth daily. 90 tablet 3   No current facility-administered medications on file prior to visit.    Review of Systems  Constitutional: Negative for other unusual diaphoresis or sweats HENT: Negative for ear discharge or swelling Eyes: Negative for other worsening visual  disturbances Respiratory: Negative for stridor or other swelling  Gastrointestinal: Negative for worsening distension or other blood Genitourinary: Negative for retention or other urinary change Musculoskeletal: Negative for other MSK pain or swelling Skin: Negative for color change or other new lesions Neurological: Negative for worsening tremors and other numbness  Psychiatric/Behavioral: Negative for worsening agitation or other fatigue All other system neg per pt    Objective:   Physical Exam BP 118/86   Pulse (!) 105   Ht '5\' 8"'  (1.727 m)   Wt 272 lb (123.4 kg)   SpO2 98%   BMI 41.36 kg/m  VS noted, not ill  appearing Constitutional: Pt appears in NAD HENT: Head: NCAT.  Right Ear: External ear normal.  Left Ear: External ear normal.  Eyes: . Pupils are equal, round, and reactive to light. Conjunctivae and EOM are normal Nose: without d/c or deformity Neck: Neck supple. Gross normal ROM Cardiovascular: Normal rate and regular rhythm.   Pulmonary/Chest: Effort normal and breath sounds without rales or wheezing.  Abd:  Soft, NT, ND, + BS, no organomegaly Neurological: Pt is alert. At baseline orientation, motor grossly intact Skin: Skin is warm. No rashes, other new lesions, no LE edema Psychiatric: Pt behavior is normal without agitation  No other exam findings Lab Results  Component Value Date   WBC 9.9 04/25/2016   HGB 14.8 04/25/2016   HCT 45.0 04/25/2016   PLT 322.0 04/25/2016   GLUCOSE 138 (H) 04/25/2016   CHOL 142 04/25/2016   TRIG 223.0 (H) 04/25/2016   HDL 37.30 (L) 04/25/2016   LDLDIRECT 76.0 04/25/2016   LDLCALC 116 (H) 03/30/2015   ALT 19 04/25/2016   AST 14 04/25/2016   NA 139 04/25/2016   K 3.9 04/25/2016   CL 101 04/25/2016   CREATININE 0.83 04/25/2016   BUN 14 04/25/2016   CO2 30 04/25/2016   TSH 2.23 04/25/2016   PSA 0.76 04/25/2016   HGBA1C 7.4 (H) 04/25/2016   MICROALBUR 1.0 04/25/2016       Assessment & Plan:

## 2016-08-28 NOTE — Patient Instructions (Addendum)
Please continue all other medications as before, and refills have been done if requested.  Please have the pharmacy call with any other refills you may need.  Please continue your efforts at being more active, low cholesterol diabetic diet, and weight control.  Please keep your appointments with your specialists as you may have planned  You will be contacted regarding the referral for: GI

## 2016-08-29 DIAGNOSIS — R1310 Dysphagia, unspecified: Secondary | ICD-10-CM | POA: Insufficient documentation

## 2016-08-29 NOTE — Assessment & Plan Note (Signed)
Mild with possible breakthrough, for contd PPI

## 2016-08-29 NOTE — Assessment & Plan Note (Signed)
?   Functional vs mechanical, for GI referral, likely needs EGD

## 2016-08-29 NOTE — Assessment & Plan Note (Signed)
stable overall by history and exam, recent data reviewed with pt, and pt to continue medical treatment as before,  to f/u any worsening symptoms or concerns Lab Results  Component Value Date   HGBA1C 7.4 (H) 04/25/2016

## 2016-09-03 ENCOUNTER — Other Ambulatory Visit: Payer: Self-pay | Admitting: Gastroenterology

## 2016-09-03 DIAGNOSIS — K219 Gastro-esophageal reflux disease without esophagitis: Secondary | ICD-10-CM

## 2016-09-03 DIAGNOSIS — R1013 Epigastric pain: Secondary | ICD-10-CM

## 2016-09-05 ENCOUNTER — Other Ambulatory Visit: Payer: BLUE CROSS/BLUE SHIELD

## 2016-09-06 ENCOUNTER — Ambulatory Visit
Admission: RE | Admit: 2016-09-06 | Discharge: 2016-09-06 | Disposition: A | Discharge status: Home / Self Care | Payer: BLUE CROSS/BLUE SHIELD | Source: Ambulatory Visit | Attending: Gastroenterology | Admitting: Gastroenterology

## 2016-09-06 DIAGNOSIS — R1013 Epigastric pain: Secondary | ICD-10-CM

## 2016-09-06 DIAGNOSIS — K219 Gastro-esophageal reflux disease without esophagitis: Secondary | ICD-10-CM

## 2016-09-18 ENCOUNTER — Other Ambulatory Visit: Payer: Self-pay | Admitting: Internal Medicine

## 2016-09-19 ENCOUNTER — Other Ambulatory Visit: Payer: Self-pay | Admitting: Internal Medicine

## 2016-09-28 DIAGNOSIS — F419 Anxiety disorder, unspecified: Secondary | ICD-10-CM | POA: Diagnosis not present

## 2016-09-28 DIAGNOSIS — R41840 Attention and concentration deficit: Secondary | ICD-10-CM | POA: Diagnosis not present

## 2016-10-02 ENCOUNTER — Ambulatory Visit: Payer: Self-pay | Admitting: Internal Medicine

## 2016-10-10 ENCOUNTER — Other Ambulatory Visit: Payer: Self-pay | Admitting: Internal Medicine

## 2016-10-10 ENCOUNTER — Encounter: Payer: Self-pay | Admitting: Internal Medicine

## 2016-10-10 ENCOUNTER — Ambulatory Visit (INDEPENDENT_AMBULATORY_CARE_PROVIDER_SITE_OTHER): Payer: BLUE CROSS/BLUE SHIELD | Admitting: Internal Medicine

## 2016-10-10 ENCOUNTER — Other Ambulatory Visit (INDEPENDENT_AMBULATORY_CARE_PROVIDER_SITE_OTHER): Payer: BLUE CROSS/BLUE SHIELD

## 2016-10-10 VITALS — BP 126/84 | HR 100 | Temp 98.1°F | Ht 68.0 in | Wt 277.0 lb

## 2016-10-10 DIAGNOSIS — F411 Generalized anxiety disorder: Secondary | ICD-10-CM | POA: Diagnosis not present

## 2016-10-10 DIAGNOSIS — E785 Hyperlipidemia, unspecified: Secondary | ICD-10-CM | POA: Diagnosis not present

## 2016-10-10 DIAGNOSIS — Z23 Encounter for immunization: Secondary | ICD-10-CM

## 2016-10-10 DIAGNOSIS — IMO0001 Reserved for inherently not codable concepts without codable children: Secondary | ICD-10-CM

## 2016-10-10 DIAGNOSIS — E1165 Type 2 diabetes mellitus with hyperglycemia: Secondary | ICD-10-CM

## 2016-10-10 DIAGNOSIS — Z0000 Encounter for general adult medical examination without abnormal findings: Secondary | ICD-10-CM | POA: Diagnosis not present

## 2016-10-10 LAB — HEPATIC FUNCTION PANEL
ALT: 15 U/L (ref 0–53)
AST: 14 U/L (ref 0–37)
Albumin: 4 g/dL (ref 3.5–5.2)
Alkaline Phosphatase: 74 U/L (ref 39–117)
Bilirubin, Direct: 0 mg/dL (ref 0.0–0.3)
Total Bilirubin: 0.3 mg/dL (ref 0.2–1.2)
Total Protein: 7.4 g/dL (ref 6.0–8.3)

## 2016-10-10 LAB — LIPID PANEL
Cholesterol: 120 mg/dL (ref 0–200)
HDL: 37.8 mg/dL — ABNORMAL LOW (ref >39.00)
NonHDL: 82.07
Total CHOL/HDL Ratio: 3
Triglycerides: 280 mg/dL — ABNORMAL HIGH (ref 0.0–149.0)
VLDL: 56 mg/dL — ABNORMAL HIGH (ref 0.0–40.0)

## 2016-10-10 LAB — BASIC METABOLIC PANEL
BUN: 14 mg/dL (ref 6–23)
CO2: 27 mEq/L (ref 19–32)
Calcium: 9.4 mg/dL (ref 8.4–10.5)
Chloride: 100 mEq/L (ref 96–112)
Creatinine, Ser: 0.82 mg/dL (ref 0.40–1.50)
GFR: 129.34 mL/min (ref >60.00)
Glucose, Bld: 118 mg/dL — ABNORMAL HIGH (ref 70–99)
Potassium: 3.6 mEq/L (ref 3.5–5.1)
Sodium: 136 mEq/L (ref 135–145)

## 2016-10-10 LAB — LDL CHOLESTEROL, DIRECT: Direct LDL: 56 mg/dL

## 2016-10-10 LAB — HEMOGLOBIN A1C: Hgb A1c MFr Bld: 7.6 % — ABNORMAL HIGH (ref 4.6–6.5)

## 2016-10-10 MED ORDER — METFORMIN HCL ER 500 MG PO TB24: 1500.0000 mg | ORAL_TABLET | Freq: Every day | ORAL | 3 refills | Status: DC

## 2016-10-10 NOTE — Patient Instructions (Addendum)
You had the flu shot today  Please remember to make your yearly Eye doctor appt  Please continue all other medications as before, and refills have been done if requested.  Please have the pharmacy call with any other refills you may need.  Please continue your efforts at being more active, low cholesterol diet, and weight control.  You are otherwise up to date with prevention measures today.  Please keep your appointments with your specialists as you may have planned  Please go to the LAB in the Basement (turn left off the elevator) for the tests to be done today  You will be contacted by phone if any changes need to be made immediately.  Otherwise, you will receive a letter about your results with an explanation, but please check with MyChart first.  Please remember to sign up for MyChart if you have not done so, as this will be important to you in the future with finding out test results, communicating by private email, and scheduling acute appointments online when needed.  Please return in 6 months, or sooner if needed, with Lab testing done 3-5 days before

## 2016-10-10 NOTE — Progress Notes (Signed)
Subjective:    Patient ID: Gregory Owens, male    DOB: 08-08-1970, 46 y.o.   MRN: [id number]  HPI  Here to f/u; overall doing ok,  Pt denies chest pain, increasing sob or doe, wheezing, orthopnea, PND, increased LE swelling, palpitations, dizziness or syncope.  Pt denies new neurological symptoms such as new headache, or facial or extremity weakness or numbness.  Pt denies polydipsia, polyuria, or low sugar episode.  Pt states overall good compliance with meds, mostly trying to follow appropriate diet, with wt overall stable, Denies worsening depressive symptoms, suicidal ideation, or panic; has ongoing anxiety, stable recently. Past Medical History:  Diagnosis Date  . Abnormal EKG    NS ST-T EKG changes with negative Stress cardiolite 2003  . Anxiety   . Dehydration 2005   [place] , [place]  . Diabetes mellitus without complication ([employer])   . Hyperglycemia   . Hyperlipidemia   . IBS (irritable bowel syndrome)   . OSA (obstructive sleep apnea)    resolved post surgery   Past Surgical History:  Procedure Laterality Date  . TONSILLECTOMY AND ADENOIDECTOMY      with above  . UVULOPALATOPHARYNGOPLASTY  2004   Post op bleeding complication;  Dr Janace Hoard  . WISDOM TOOTH EXTRACTION      reports that he has never smoked. He has never used smokeless tobacco. He reports that he does not drink alcohol or use drugs. family history includes Cirrhosis in his brother; Colon cancer in his paternal grandfather; Diabetes in his maternal grandmother and paternal grandmother; Heart attack (age of onset: 83) in his maternal grandmother and paternal grandmother; Hyperlipidemia in his father and mother; Hypertension in his father and mother; Other in his mother. Not on File Current Outpatient Prescriptions on File Prior to Visit  Medication Sig Dispense Refill  . aspirin 81 MG EC tablet TAKE 1 TABLET (81 MG TOTAL) BY MOUTH DAILY. 90 tablet 3  . BAYER CONTOUR NEXT TEST test strip USE TO CHECK BLOOD  SUGAR 2 TIMES DAILY 100 each 1  . BAYER MICROLET LANCETS lancets Use to help check blood sugar daily Dx E11.9 100 each 3  . Blood Glucose Monitoring Suppl (BAYER CONTOUR NEXT MONITOR) w/Device KIT Use as directed to check blood sugars daily Dx e11.9 1 kit 0  . cetirizine (ZYRTEC) 10 MG tablet Take 10 mg by mouth daily.      . EFFEXOR XR 150 MG 24 hr capsule TAKE 1 CAPSULE (150 MG TOTAL) BY MOUTH DAILY. 90 capsule 2  . lactobacillus acidophilus (BACID) TABS tablet Take 2 tablets by mouth 3 (three) times daily.    [name] losartan (COZAAR) 25 MG tablet TAKE 1 TABLET (25 MG TOTAL) BY MOUTH DAILY. 90 tablet 3  . pantoprazole (PROTONIX) 40 MG tablet Take 1 tablet (40 mg total) by mouth daily. 90 tablet 3  . rosuvastatin (CRESTOR) 10 MG tablet TAKE 1 TABLET (10 MG TOTAL) BY MOUTH DAILY. 90 tablet 3   No current facility-administered medications on file prior to visit.    Review of Systems  Constitutional: Negative for other unusual diaphoresis or sweats HENT: Negative for ear discharge or swelling Eyes: Negative for other worsening visual disturbances Respiratory: Negative for stridor or other swelling  Gastrointestinal: Negative for worsening distension or other blood Genitourinary: Negative for retention or other urinary change Musculoskeletal: Negative for other MSK pain or swelling Skin: Negative for color change or other new lesions Neurological: Negative for worsening tremors and other numbness  Psychiatric/Behavioral: Negative for worsening  agitation or other fatigue All other system neg per pt    Objective:   Physical Exam BP 126/84   Pulse 100   Temp 98.1 F (36.7 C) (Oral)   Ht '5\' 8"'  (1.727 m)   Wt 277 lb (125.6 kg)   SpO2 98%   BMI 42.12 kg/m  VS noted,  Constitutional: Pt appears in NAD HENT: Head: NCAT.  Right Ear: External ear normal.  Left Ear: External ear normal.  Eyes: . Pupils are equal, round, and reactive to light. Conjunctivae and EOM are normal Nose: without d/c  or deformity Neck: Neck supple. Gross normal ROM Cardiovascular: Normal rate and regular rhythm.   Pulmonary/Chest: Effort normal and breath sounds without rales or wheezing.  Abd:  Soft, NT, ND, + BS, no organomegaly Neurological: Pt is alert. At baseline orientation, motor grossly intact Skin: Skin is warm. No rashes, other new lesions, no LE edema Psychiatric: Pt behavior is normal without agitation , mild nervous No other exam findings Lab Results  Component Value Date   WBC 9.9 04/25/2016   HGB 14.8 04/25/2016   HCT 45.0 04/25/2016   PLT 322.0 04/25/2016   GLUCOSE 118 (H) 10/10/2016   CHOL 120 10/10/2016   TRIG 280.0 (H) 10/10/2016   HDL 37.80 (L) 10/10/2016   LDLDIRECT 56.0 10/10/2016   LDLCALC 116 (H) 03/30/2015   ALT 15 10/10/2016   AST 14 10/10/2016   NA 136 10/10/2016   K 3.6 10/10/2016   CL 100 10/10/2016   CREATININE 0.82 10/10/2016   BUN 14 10/10/2016   CO2 27 10/10/2016   TSH 2.23 04/25/2016   PSA 0.76 04/25/2016   HGBA1C 7.6 (H) 10/10/2016   MICROALBUR 1.0 04/25/2016      Assessment & Plan:

## 2016-10-11 ENCOUNTER — Telehealth: Payer: Self-pay

## 2016-10-11 NOTE — Telephone Encounter (Signed)
Called pt, LVM.   

## 2016-10-11 NOTE — Telephone Encounter (Signed)
-----   Message from Corwin LevinsJames W John, MD sent at 10/10/2016  6:25 PM EDT ----- Left message on MyChart, pt to cont same tx except  The test results show that your current treatment is OK, except the A1c is still mildly too high.  Please increase the metformin ER 500 mg to 3 pills in the AM.  I will send a new prescription, and you should be called about this as well.Lendell Caprice.    Thyra Yinger to please inform pt, I will do rx

## 2016-10-13 NOTE — Assessment & Plan Note (Signed)
stable overall by history and exam, recent data reviewed with pt, and pt to continue medical treatment as before,  to f/u any worsening symptoms or concerns  

## 2016-10-13 NOTE — Assessment & Plan Note (Signed)
stable overall by history and exam, recent data reviewed with pt, and pt to continue medical treatment as before,  to f/u any worsening symptoms or concerns, for f/u ldl

## 2017-01-21 DIAGNOSIS — M79604 Pain in right leg: Secondary | ICD-10-CM | POA: Diagnosis not present

## 2017-01-21 DIAGNOSIS — M79651 Pain in right thigh: Secondary | ICD-10-CM | POA: Diagnosis not present

## 2017-01-21 DIAGNOSIS — E119 Type 2 diabetes mellitus without complications: Secondary | ICD-10-CM | POA: Diagnosis not present

## 2017-01-21 DIAGNOSIS — R079 Chest pain, unspecified: Secondary | ICD-10-CM | POA: Diagnosis not present

## 2017-01-21 DIAGNOSIS — R0989 Other specified symptoms and signs involving the circulatory and respiratory systems: Secondary | ICD-10-CM | POA: Diagnosis not present

## 2017-01-21 DIAGNOSIS — R9431 Abnormal electrocardiogram [ECG] [EKG]: Secondary | ICD-10-CM | POA: Diagnosis not present

## 2017-01-22 DIAGNOSIS — R079 Chest pain, unspecified: Secondary | ICD-10-CM | POA: Diagnosis not present

## 2017-01-22 DIAGNOSIS — M79604 Pain in right leg: Secondary | ICD-10-CM | POA: Diagnosis not present

## 2017-01-28 LAB — HM DIABETES EYE EXAM

## 2017-02-18 ENCOUNTER — Other Ambulatory Visit: Payer: Self-pay | Admitting: Internal Medicine

## 2017-02-18 MED ORDER — EFFEXOR XR 150 MG PO CP24: ORAL_CAPSULE | ORAL | 2 refills | Status: DC

## 2017-03-08 ENCOUNTER — Other Ambulatory Visit (INDEPENDENT_AMBULATORY_CARE_PROVIDER_SITE_OTHER): Payer: PRIVATE HEALTH INSURANCE

## 2017-03-08 ENCOUNTER — Ambulatory Visit (INDEPENDENT_AMBULATORY_CARE_PROVIDER_SITE_OTHER): Payer: PRIVATE HEALTH INSURANCE | Admitting: Internal Medicine

## 2017-03-08 VITALS — BP 118/84 | HR 100 | Temp 98.1°F | Ht 68.0 in | Wt 272.0 lb

## 2017-03-08 DIAGNOSIS — E1165 Type 2 diabetes mellitus with hyperglycemia: Secondary | ICD-10-CM

## 2017-03-08 DIAGNOSIS — M79671 Pain in right foot: Secondary | ICD-10-CM | POA: Diagnosis not present

## 2017-03-08 DIAGNOSIS — M79672 Pain in left foot: Secondary | ICD-10-CM | POA: Diagnosis not present

## 2017-03-08 DIAGNOSIS — Z0000 Encounter for general adult medical examination without abnormal findings: Secondary | ICD-10-CM

## 2017-03-08 LAB — CBC WITH DIFFERENTIAL/PLATELET
Basophils Absolute: 27 cells/uL (ref 0–200)
Basophils Relative: 0.3 %
Eosinophils Absolute: 142 cells/uL (ref 15–500)
Eosinophils Relative: 1.6 %
HCT: 42.4 % (ref 38.5–50.0)
Hemoglobin: 14.1 g/dL (ref 13.2–17.1)
Lymphs Abs: 2448 cells/uL (ref 850–3900)
MCH: 29.6 pg (ref 27.0–33.0)
MCHC: 33.3 g/dL (ref 32.0–36.0)
MCV: 88.9 fL (ref 80.0–100.0)
MPV: 9.6 fL (ref 7.5–12.5)
Monocytes Relative: 5.3 %
Neutro Abs: 5812 cells/uL (ref 1500–7800)
Neutrophils Relative %: 65.3 %
Platelets: 293 10*3/uL (ref 140–400)
RBC: 4.77 10*6/uL (ref 4.20–5.80)
RDW: 11.8 % (ref 11.0–15.0)
Total Lymphocyte: 27.5 %
WBC mixed population: 472 cells/uL (ref 200–950)
WBC: 8.9 10*3/uL (ref 3.8–10.8)

## 2017-03-08 LAB — BASIC METABOLIC PANEL
BUN: 16 mg/dL (ref 6–23)
CO2: 32 mEq/L (ref 19–32)
Calcium: 9.1 mg/dL (ref 8.4–10.5)
Chloride: 103 mEq/L (ref 96–112)
Creatinine, Ser: 0.91 mg/dL (ref 0.40–1.50)
GFR: 114.49 mL/min (ref >60.00)
Glucose, Bld: 160 mg/dL — ABNORMAL HIGH (ref 70–99)
Potassium: 4 mEq/L (ref 3.5–5.1)
Sodium: 141 mEq/L (ref 135–145)

## 2017-03-08 LAB — MICROALBUMIN / CREATININE URINE RATIO
Creatinine,U: 228.7 mg/dL
Microalb Creat Ratio: 0.4 mg/g (ref 0.0–30.0)
Microalb, Ur: 1 mg/dL (ref 0.0–1.9)

## 2017-03-08 LAB — PSA: PSA: 0.62 ng/mL (ref 0.10–4.00)

## 2017-03-08 LAB — TSH: TSH: 1.99 u[IU]/mL (ref 0.35–4.50)

## 2017-03-08 LAB — URINALYSIS, ROUTINE W REFLEX MICROSCOPIC
Bilirubin Urine: NEGATIVE
Hgb urine dipstick: NEGATIVE
Ketones, ur: NEGATIVE
Leukocytes, UA: NEGATIVE
Nitrite: NEGATIVE
RBC / HPF: NONE SEEN (ref 0–?)
Specific Gravity, Urine: >=1.03 — AB (ref 1.000–1.030)
Total Protein, Urine: NEGATIVE
Urine Glucose: NEGATIVE
Urobilinogen, UA: 0.2 (ref 0.0–1.0)
pH: 6 (ref 5.0–8.0)

## 2017-03-08 LAB — LIPID PANEL
Cholesterol: 112 mg/dL (ref 0–200)
HDL: 30.7 mg/dL — ABNORMAL LOW (ref >39.00)
LDL Cholesterol: 55 mg/dL (ref 0–99)
NonHDL: 81.28
Total CHOL/HDL Ratio: 4
Triglycerides: 132 mg/dL (ref 0.0–149.0)
VLDL: 26.4 mg/dL (ref 0.0–40.0)

## 2017-03-08 LAB — HEPATIC FUNCTION PANEL
ALT: 12 U/L (ref 0–53)
AST: 12 U/L (ref 0–37)
Albumin: 3.9 g/dL (ref 3.5–5.2)
Alkaline Phosphatase: 57 U/L (ref 39–117)
Bilirubin, Direct: 0.1 mg/dL (ref 0.0–0.3)
Total Bilirubin: 0.5 mg/dL (ref 0.2–1.2)
Total Protein: 7.1 g/dL (ref 6.0–8.3)

## 2017-03-08 LAB — HEMOGLOBIN A1C: Hgb A1c MFr Bld: 6.7 % — ABNORMAL HIGH (ref 4.6–6.5)

## 2017-03-08 NOTE — Assessment & Plan Note (Signed)
Exam benign, reassured, consider wearing otc orthotics, encourage wt loss and activity increased

## 2017-03-08 NOTE — Progress Notes (Signed)
Subjective:    Patient ID: Gregory Owens, male    DOB: April 11, 1970, 47 y.o.   MRN: [id number]  HPI    Here for wellness and f/u;  Overall doing ok;  Pt denies Chest pain, worsening SOB, DOE, wheezing, orthopnea, PND, worsening LE edema, palpitations, dizziness or syncope.  Pt denies neurological change such as new headache, facial or extremity weakness.  Pt denies polydipsia, polyuria, or low sugar symptoms. Pt states overall good compliance with treatment and medications, good tolerability, and has been trying to follow appropriate diet.  Pt denies worsening depressive symptoms, suicidal ideation or panic. No fever, night sweats, wt loss, loss of appetite, or other constitutional symptoms.  Pt states good ability with ADL's, has low fall risk, home safety reviewed and adequate, no other significant changes in hearing or vision, and only occasionally active with exercise.   Also with 1-2 wks bilat feet "tightness" kind of discomfort,  noticing more over this time, though has been milder and noted by him in months past as wel, is overall mild intermitten, mild but not enough to have to take nsaid or tylenol.  Has also right great toe pain as well more specifically x 6 mo with slight swelling but no redness, heat, ulcers or other skin changes.   Did recently started excercising,  20-30 min x 2 mo at tym, mostly walking 2-3 times per wk.  .  Nothing else seems to make better or worse.   Past Medical History:  Diagnosis Date  . Abnormal EKG    NS ST-T EKG changes with negative Stress cardiolite 2003  . Anxiety   . Dehydration 2005   [place] , [place]  . Diabetes mellitus without complication ([place])   . Hyperglycemia   . Hyperlipidemia   . IBS (irritable bowel syndrome)   . OSA (obstructive sleep apnea)    resolved post surgery   Past Surgical History:  Procedure Laterality Date  . TONSILLECTOMY AND ADENOIDECTOMY      with above  . UVULOPALATOPHARYNGOPLASTY  2004   Post op bleeding  complication;  Dr Janace Hoard  . WISDOM TOOTH EXTRACTION      reports that  has never smoked. he has never used smokeless tobacco. He reports that he does not drink alcohol or use drugs. family history includes Cirrhosis in his brother; Colon cancer in his paternal grandfather; Diabetes in his maternal grandmother and paternal grandmother; Heart attack (age of onset: 71) in his maternal grandmother and paternal grandmother; Hyperlipidemia in his father and mother; Hypertension in his father and mother; Other in his mother. Not on File Current Outpatient Medications on File Prior to Visit  Medication Sig Dispense Refill  . aspirin 81 MG EC tablet TAKE 1 TABLET (81 MG TOTAL) BY MOUTH DAILY. 90 tablet 3  . BAYER CONTOUR NEXT TEST test strip USE TO CHECK BLOOD SUGAR 2 TIMES DAILY 100 each 1  . BAYER MICROLET LANCETS lancets Use to help check blood sugar daily Dx E11.9 100 each 3  . Blood Glucose Monitoring Suppl (BAYER CONTOUR NEXT MONITOR) w/Device KIT Use as directed to check blood sugars daily Dx e11.9 1 kit 0  . cetirizine (ZYRTEC) 10 MG tablet Take 10 mg by mouth daily.      . EFFEXOR XR 150 MG 24 hr capsule TAKE 1 CAPSULE (150 MG TOTAL) BY MOUTH DAILY. 90 capsule 2  . lactobacillus acidophilus (BACID) TABS tablet Take 2 tablets by mouth 3 (three) times daily.    [name] losartan (COZAAR) 25  MG tablet TAKE 1 TABLET (25 MG TOTAL) BY MOUTH DAILY. 90 tablet 3  . metFORMIN (GLUCOPHAGE-XR) 500 MG 24 hr tablet Take 3 tablets (1,500 mg total) by mouth daily with breakfast. 270 tablet 3  . pantoprazole (PROTONIX) 40 MG tablet Take 1 tablet (40 mg total) by mouth daily. 90 tablet 3  . rosuvastatin (CRESTOR) 10 MG tablet TAKE 1 TABLET (10 MG TOTAL) BY MOUTH DAILY. 90 tablet 3   No current facility-administered medications on file prior to visit.    Review of Systems  Constitutional: Negative for other unusual diaphoresis, sweats, appetite or weight changes HENT: Negative for other worsening hearing loss, ear  pain, facial swelling, mouth sores or neck stiffness.   Eyes: Negative for other worsening pain, redness or other visual disturbance.  Respiratory: Negative for other stridor or swelling Cardiovascular: Negative for other palpitations or other chest pain  Gastrointestinal: Negative for worsening diarrhea or loose stools, blood in stool, distention or other pain Genitourinary: Negative for hematuria, flank pain or other change in urine volume.  Musculoskeletal: Negative for myalgias or other joint swelling.  Skin: Negative for other color change, or other wound or worsening drainage.  Neurological: Negative for other syncope or numbness. Hematological: Negative for other adenopathy or swelling Psychiatric/Behavioral: Negative for hallucinations, other worsening agitation, SI, self-injury, or new decreased concentration All other system neg per pt    Objective:   Physical Exam BP 118/84   Pulse 100   Temp 98.1 F (36.7 C) (Oral)   Ht '5\' 8"'  (1.727 m)   Wt 272 lb (123.4 kg)   SpO2 96%   BMI 41.36 kg/m  VS noted,  Constitutional: Pt is oriented to person, place, and time. Appears well-developed and well-nourished, in no significant distress and comfortable Head: Normocephalic and atraumatic  Eyes: Conjunctivae and EOM are normal. Pupils are equal, round, and reactive to light Right Ear: External ear normal without discharge Left Ear: External ear normal without discharge Nose: Nose without discharge or deformity Mouth/Throat: Oropharynx is without other ulcerations and moist  Neck: Normal range of motion. Neck supple. No JVD present. No tracheal deviation present or significant neck LA or mass Cardiovascular: Normal rate, regular rhythm, normal heart sounds and intact distal pulses.   Pulmonary/Chest: WOB normal and breath sounds without rales or wheezing  Abdominal: Soft. Bowel sounds are normal. NT. No HSM  Musculoskeletal: Normal range of motion. Exhibits no edema Lymphadenopathy:  Has no other cervical adenopathy.  Neurological: Pt is alert and oriented to person, place, and time. Pt has normal reflexes. No cranial nerve deficit. Motor grossly intact, Gait intact Skin: Skin is warm and dry. No rash noted or new ulcerations, bilat feet with slight bony deg change at the first MTP, no other swelling or tender and o/w both feet neurovasc intact Psychiatric:  Has nervous mood and affect. Behavior is normal without agitation No other exam findings    Assessment & Plan:

## 2017-03-08 NOTE — Patient Instructions (Signed)
Ok to try the OTC orthotics for the feet to see if improved  Please continue all other medications as before, and refills have been done if requested.  Please have the pharmacy call with any other refills you may need.  Please continue your efforts at being more active, low cholesterol diet, and weight control.  You are otherwise up to date with prevention measures today.  Please keep your appointments with your specialists as you may have planned  Please go to the LAB in the Basement (turn left off the elevator) for the tests to be done today  You will be contacted by phone if any changes need to be made immediately.  Otherwise, you will receive a letter about your results with an explanation, but please check with MyChart first.  Please remember to sign up for MyChart if you have not done so, as this will be important to you in the future with finding out test results, communicating by private email, and scheduling acute appointments online when needed.  Please return in 6 months, or sooner if needed, with Lab testing done 3-5 days before

## 2017-03-08 NOTE — Assessment & Plan Note (Signed)

## 2017-03-08 NOTE — Assessment & Plan Note (Signed)
stable overall by history and exam, recent data reviewed with pt, and pt to continue medical treatment as before,  to f/u any worsening symptoms or concerns Lab Results  Component Value Date   HGBA1C 6.7 (H) 03/08/2017

## 2017-03-25 ENCOUNTER — Other Ambulatory Visit: Payer: Self-pay | Admitting: Internal Medicine

## 2017-05-02 ENCOUNTER — Encounter: Payer: Self-pay | Admitting: Family

## 2017-05-02 ENCOUNTER — Ambulatory Visit (INDEPENDENT_AMBULATORY_CARE_PROVIDER_SITE_OTHER): Payer: PRIVATE HEALTH INSURANCE | Admitting: Family

## 2017-05-02 ENCOUNTER — Ambulatory Visit: Payer: Self-pay

## 2017-05-02 VITALS — BP 138/78 | HR 121 | Temp 98.7°F | Ht 68.0 in | Wt 271.0 lb

## 2017-05-02 DIAGNOSIS — J209 Acute bronchitis, unspecified: Secondary | ICD-10-CM

## 2017-05-02 DIAGNOSIS — J309 Allergic rhinitis, unspecified: Secondary | ICD-10-CM

## 2017-05-02 MED ORDER — AZITHROMYCIN 250 MG PO TABS: ORAL_TABLET | ORAL | 0 refills | Status: DC

## 2017-05-02 MED ORDER — FLUTICASONE PROPIONATE 50 MCG/ACT NA SUSP: 2.0000 | Freq: Every day | NASAL | 6 refills | Status: DC

## 2017-05-02 MED ORDER — ALBUTEROL SULFATE HFA 108 (90 BASE) MCG/ACT IN AERS: 2.0000 | INHALATION_SPRAY | Freq: Four times a day (QID) | RESPIRATORY_TRACT | 0 refills | Status: DC | PRN

## 2017-05-02 NOTE — Progress Notes (Signed)
Jovian Byrl Latin is a 47 y.o. male with the following history as recorded in EpicCare:  Patient Active Problem List   Diagnosis Date Noted  . Bilateral foot pain 03/08/2017  . Dysphagia 08/29/2016  . ADHD 03/28/2016  . Morbid obesity ([place]) 12/06/2015  . Preventative health care 03/10/2015  . Skin lesion 03/10/2015  . GERD (gastroesophageal reflux disease) 11/04/2013  . Tachycardia 11/04/2013  . Nonspecific abnormal electrocardiogram (ECG) (EKG) 11/04/2013  . Non-compliant behavior 10/14/2013  . Uncontrolled diabetes mellitus ([place]) 08/19/2012  . PLMD (periodic limb movement disorder) 05/30/2012  . SLEEP DISORDER 12/05/2009  . Hyperlipidemia 08/25/2009  . GAD (generalized anxiety disorder) 08/25/2009  . Obstructive sleep apnea 08/25/2009  . RHINITIS 05/24/2009    Current Outpatient Medications  Medication Sig Dispense Refill  . aspirin 81 MG EC tablet TAKE 1 TABLET (81 MG TOTAL) BY MOUTH DAILY. 90 tablet 3  . BAYER CONTOUR NEXT TEST test strip USE TO CHECK BLOOD SUGAR 2 TIMES DAILY 100 each 1  . BAYER MICROLET LANCETS lancets Use to help check blood sugar daily Dx E11.9 100 each 3  . Blood Glucose Monitoring Suppl (BAYER CONTOUR NEXT MONITOR) w/Device KIT Use as directed to check blood sugars daily Dx e11.9 1 kit 0  . cetirizine (ZYRTEC) 10 MG tablet Take 10 mg by mouth daily.      . EFFEXOR XR 150 MG 24 hr capsule TAKE 1 CAPSULE (150 MG TOTAL) BY MOUTH DAILY. 90 capsule 2  . ketoconazole (NIZORAL) 2 % shampoo APPLY TO SCALP AND FACE AND SHAMPOO TWICE WEEKLY  6  . losartan (COZAAR) 25 MG tablet TAKE 1 TABLET (25 MG TOTAL) BY MOUTH DAILY. 90 tablet 3  . metFORMIN (GLUCOPHAGE-XR) 500 MG 24 hr tablet Take 3 tablets (1,500 mg total) by mouth daily with breakfast. 270 tablet 3  . pantoprazole (PROTONIX) 40 MG tablet Take 1 tablet (40 mg total) by mouth daily. 90 tablet 3  . Probiotic Product (ALIGN PO) Take by mouth.    . rosuvastatin (CRESTOR) 10 MG tablet TAKE 1 TABLET (10 MG  TOTAL) BY MOUTH DAILY. 90 tablet 3  . albuterol (PROVENTIL HFA;VENTOLIN HFA) 108 (90 Base) MCG/ACT inhaler Inhale 2 puffs into the lungs every 6 (six) hours as needed for wheezing or shortness of breath. 1 Inhaler 0  . azithromycin (ZITHROMAX) 250 MG tablet 2 tabs po qd x 1 day; 1 tablet per day x 4 days; 6 tablet 0  . fluticasone (FLONASE) 50 MCG/ACT nasal spray Place 2 sprays into both nostrils daily. 16 g 6   No current facility-administered medications for this visit.     Allergies: Patient has no allergy information on record.  Past Medical History:  Diagnosis Date  . Abnormal EKG    NS ST-T EKG changes with negative Stress cardiolite 2003  . Anxiety   . Dehydration 2005   [place] , [place]  . Diabetes mellitus without complication ([place])   . Hyperglycemia   . Hyperlipidemia   . IBS (irritable bowel syndrome)   . OSA (obstructive sleep apnea)    resolved post surgery    Past Surgical History:  Procedure Laterality Date  . TONSILLECTOMY AND ADENOIDECTOMY      with above  . UVULOPALATOPHARYNGOPLASTY  2004   Post op bleeding complication;  Dr Janace Hoard  . WISDOM TOOTH EXTRACTION      Family History  Problem Relation Age of Onset  . Hyperlipidemia Mother   . Hypertension Mother   . Other Mother  Inflammatory Occipital Lymphadenitis  . Hypertension Father   . Hyperlipidemia Father   . Diabetes Maternal Grandmother   . Heart attack Maternal Grandmother 60  . Diabetes Paternal Grandmother   . Heart attack Paternal Grandmother 24  . Cirrhosis Brother        autoimmune hepatitis (twin)  . Colon cancer Paternal Grandfather     Social History   Tobacco Use  . Smoking status: Never Smoker  . Smokeless tobacco: Never Used  Substance Use Topics  . Alcohol use: No    Subjective:  Started earlier this week with coughing/ sore throat; + post-nasal drainage; has heard himself wheezing at night; denies any chest pain or shortness of breath; notes that father being treated for  pneumonia; no known fever; + seasonal allergies; +GERD-well controlled;   Objective:  Vitals:   05/02/17 1354  BP: 138/78  Pulse: (!) 121  Temp: 98.7 F (37.1 C)  TempSrc: Oral  SpO2: 97%  Weight: 271 lb (122.9 kg)  Height: _0  (1.727 m)    General: Well developed, well nourished, in no acute distress  Skin : Warm and dry.  Head: Normocephalic and atraumatic  Eyes: Sclera and conjunctiva clear; pupils round and reactive to light; extraocular movements intact  Ears: External normal; canals clear; tympanic membranes normal  Oropharynx: Pink, supple. No suspicious lesions  Neck: Supple without thyromegaly, adenopathy  Lungs: Respirations unlabored; clear to auscultation bilaterally without wheeze, rales, rhonchi  Neurologic: Alert and oriented; speech intact; face symmetrical; moves all extremities well; CNII-XII intact without focal deficit   Assessment:  1. Acute bronchitis, unspecified organism   2. Allergic rhinitis, unspecified seasonality, unspecified trigger     Plan:  Rx for Z-pak #1 take as directed, Flonase and albuterol; encouraged to use Flonase with regularity throughout allergy season; increase fluids, rest and follow-up worse, no better.   No follow-ups on file.  No orders of the defined types were placed in this encounter.   Requested Prescriptions   Signed Prescriptions Disp Refills  . fluticasone (FLONASE) 50 MCG/ACT nasal spray 16 g 6    Sig: Place 2 sprays into both nostrils daily.  [name] azithromycin (ZITHROMAX) 250 MG tablet 6 tablet 0    Sig: 2 tabs po qd x 1 day; 1 tablet per day x 4 days;  . albuterol (PROVENTIL HFA;VENTOLIN HFA) 108 (90 Base) MCG/ACT inhaler 1 Inhaler 0    Sig: Inhale 2 puffs into the lungs every 6 (six) hours as needed for wheezing or shortness of breath.

## 2017-05-02 NOTE — Telephone Encounter (Signed)
Pt. C/o sore throat, dry cough, nasal discharge x 2 days. Cough worse at night and has some wheezing. Appointment made for today.  Reason for Disposition . [1] Patient also has allergy symptoms (e.g., itchy eyes, clear nasal discharge, postnasal drip) AND [2] they are acting up  Answer Assessment - Initial Assessment Questions 1. ONSET: "When did the cough begin?"      Started 2 days ago 2. SEVERITY: "How bad is the cough today?"      Cough worse at night 3. RESPIRATORY DISTRESS: "Describe your breathing."      No distress 4. FEVER: "Do you have a fever?" If so, ask: "What is your temperature, how was it measured, and when did it start?"     No 5. HEMOPTYSIS: "Are you coughing up any blood?" If so ask: "How much?" (flecks, streaks, tablespoons, etc.)     No 6. TREATMENT: "What have you done so far to treat the cough?" (e.g., meds, fluids, humidifier)     Alka seltzer cold and flu 7. CARDIAC HISTORY: "Do you have any history of heart disease?" (e.g., heart attack, congestive heart failure)      No 8. LUNG HISTORY: "Do you have any history of lung disease?"  (e.g., pulmonary embolus, asthma, emphysema)     No 9. PE RISK FACTORS: "Do you have a history of blood clots?" (or: recent major surgery, recent prolonged travel, bedridden )     No 10. OTHER SYMPTOMS: "Do you have any other symptoms? (e.g., runny nose, wheezing, chest pain)       Wheezing , nasal drainage - no color 11. PREGNANCY: "Is there any chance you are pregnant?" "When was your last menstrual period?"       No 12. TRAVEL: "Have you traveled out of the country in the last month?" (e.g., travel history, exposures)       No  Protocols used: COUGH - ACUTE NON-PRODUCTIVE-A-AH

## 2017-05-04 ENCOUNTER — Telehealth: Payer: Self-pay | Admitting: Internal Medicine

## 2017-05-04 NOTE — Telephone Encounter (Signed)
Patient called [facility] stating he had a running or stuffy nose.  States he was given antibiotics, albuterol and Flonase.  States he has a temp of 99.  During triage patient requested to end call.

## 2017-05-06 ENCOUNTER — Ambulatory Visit (INDEPENDENT_AMBULATORY_CARE_PROVIDER_SITE_OTHER)
Admission: RE | Admit: 2017-05-06 | Discharge: 2017-05-06 | Disposition: A | Discharge status: Home / Self Care | Payer: PRIVATE HEALTH INSURANCE | Source: Ambulatory Visit | Attending: Family | Admitting: Family

## 2017-05-06 ENCOUNTER — Ambulatory Visit (INDEPENDENT_AMBULATORY_CARE_PROVIDER_SITE_OTHER): Payer: PRIVATE HEALTH INSURANCE | Admitting: Family

## 2017-05-06 ENCOUNTER — Telehealth: Payer: Self-pay | Admitting: Internal Medicine

## 2017-05-06 ENCOUNTER — Encounter: Payer: Self-pay | Admitting: Family

## 2017-05-06 ENCOUNTER — Other Ambulatory Visit: Payer: Self-pay | Admitting: Family

## 2017-05-06 VITALS — BP 140/82 | HR 106 | Temp 98.2°F | Ht 68.0 in | Wt 264.0 lb

## 2017-05-06 DIAGNOSIS — J209 Acute bronchitis, unspecified: Secondary | ICD-10-CM | POA: Diagnosis not present

## 2017-05-06 DIAGNOSIS — R05 Cough: Secondary | ICD-10-CM | POA: Diagnosis not present

## 2017-05-06 DIAGNOSIS — R059 Cough, unspecified: Secondary | ICD-10-CM

## 2017-05-06 MED ORDER — PREDNISONE 20 MG PO TABS: 20.0000 mg | ORAL_TABLET | Freq: Every day | ORAL | 0 refills | Status: DC

## 2017-05-06 NOTE — Telephone Encounter (Signed)
Copied from CRM [phone]. Topic: Quick Communication - See Telephone Encounter >> May 06, 2017 10:42 AM Cipriano BunkerLambe, Annette S wrote: CRM for notification.   Pt. Has taken the antibiotic and had fever 103 over weekend, and is coughing up green stuff now.  Asking if he needs a f/u appt or what he needs to do.   See Telephone encounter for: 05/06/17.

## 2017-05-06 NOTE — Telephone Encounter (Signed)
Spoke with patient and info given regarding Chest xray today.

## 2017-05-06 NOTE — Telephone Encounter (Signed)
Please advise. Thanks.  

## 2017-05-06 NOTE — Telephone Encounter (Signed)
Noted  

## 2017-05-06 NOTE — Progress Notes (Signed)
Gregory Owens is a 47 y.o. male with the following history as recorded in EpicCare:  Patient Active Problem List   Diagnosis Date Noted  . Bilateral foot pain 03/08/2017  . Dysphagia 08/29/2016  . ADHD 03/28/2016  . Morbid obesity ([place]) 12/06/2015  . Preventative health care 03/10/2015  . Skin lesion 03/10/2015  . GERD (gastroesophageal reflux disease) 11/04/2013  . Tachycardia 11/04/2013  . Nonspecific abnormal electrocardiogram (ECG) (EKG) 11/04/2013  . Non-compliant behavior 10/14/2013  . Uncontrolled diabetes mellitus ([place]) 08/19/2012  . PLMD (periodic limb movement disorder) 05/30/2012  . SLEEP DISORDER 12/05/2009  . Hyperlipidemia 08/25/2009  . GAD (generalized anxiety disorder) 08/25/2009  . Obstructive sleep apnea 08/25/2009  . RHINITIS 05/24/2009    Current Outpatient Medications  Medication Sig Dispense Refill  . albuterol (PROVENTIL HFA;VENTOLIN HFA) 108 (90 Base) MCG/ACT inhaler Inhale 2 puffs into the lungs every 6 (six) hours as needed for wheezing or shortness of breath. 1 Inhaler 0  . aspirin 81 MG EC tablet TAKE 1 TABLET (81 MG TOTAL) BY MOUTH DAILY. 90 tablet 3  . azithromycin (ZITHROMAX) 250 MG tablet 2 tabs po qd x 1 day; 1 tablet per day x 4 days; 6 tablet 0  . BAYER CONTOUR NEXT TEST test strip USE TO CHECK BLOOD SUGAR 2 TIMES DAILY 100 each 1  . BAYER MICROLET LANCETS lancets Use to help check blood sugar daily Dx E11.9 100 each 3  . Blood Glucose Monitoring Suppl (BAYER CONTOUR NEXT MONITOR) w/Device KIT Use as directed to check blood sugars daily Dx e11.9 1 kit 0  . cetirizine (ZYRTEC) 10 MG tablet Take 10 mg by mouth daily.      . EFFEXOR XR 150 MG 24 hr capsule TAKE 1 CAPSULE (150 MG TOTAL) BY MOUTH DAILY. 90 capsule 2  . fluticasone (FLONASE) 50 MCG/ACT nasal spray Place 2 sprays into both nostrils daily. 16 g 6  . ketoconazole (NIZORAL) 2 % shampoo APPLY TO SCALP AND FACE AND SHAMPOO TWICE WEEKLY  6  . losartan (COZAAR) 25 MG tablet TAKE 1  TABLET (25 MG TOTAL) BY MOUTH DAILY. 90 tablet 3  . metFORMIN (GLUCOPHAGE-XR) 500 MG 24 hr tablet Take 3 tablets (1,500 mg total) by mouth daily with breakfast. 270 tablet 3  . pantoprazole (PROTONIX) 40 MG tablet Take 1 tablet (40 mg total) by mouth daily. 90 tablet 3  . Probiotic Product (ALIGN PO) Take by mouth.    . rosuvastatin (CRESTOR) 10 MG tablet TAKE 1 TABLET (10 MG TOTAL) BY MOUTH DAILY. 90 tablet 3  . predniSONE (DELTASONE) 20 MG tablet Take 1 tablet (20 mg total) by mouth daily with breakfast. 5 tablet 0   No current facility-administered medications for this visit.     Allergies: Patient has no allergy information on record.  Past Medical History:  Diagnosis Date  . Abnormal EKG    NS ST-T EKG changes with negative Stress cardiolite 2003  . Anxiety   . Dehydration 2005   [place] , [place]  . Diabetes mellitus without complication ([place])   . Hyperglycemia   . Hyperlipidemia   . IBS (irritable bowel syndrome)   . OSA (obstructive sleep apnea)    resolved post surgery    Past Surgical History:  Procedure Laterality Date  . TONSILLECTOMY AND ADENOIDECTOMY      with above  . UVULOPALATOPHARYNGOPLASTY  2004   Post op bleeding complication;  Dr Janace Hoard  . WISDOM TOOTH EXTRACTION      Family History  Problem Relation  Age of Onset  . Hyperlipidemia Mother   . Hypertension Mother   . Other Mother        Inflammatory Occipital Lymphadenitis  . Hypertension Father   . Hyperlipidemia Father   . Diabetes Maternal Grandmother   . Heart attack Maternal Grandmother 60  . Diabetes Paternal Grandmother   . Heart attack Paternal Grandmother 23  . Cirrhosis Brother        autoimmune hepatitis (twin)  . Colon cancer Paternal Grandfather     Social History   Tobacco Use  . Smoking status: Never Smoker  . Smokeless tobacco: Never Used  Substance Use Topics  . Alcohol use: No    Subjective:  Patient was seen last Thursday with suspected acute bronchitis; was treated with  Zithromax; notes that over the weekend- symptoms worsened and developed a fever-fever got up to 103; has not run a fever in the past 24 hours; has been using OTC Mucinex Multi-symptoms; notes that he is feeling better but concerned about lingering congestion.    Objective:  Vitals:   05/06/17 1609  BP: 140/82  Pulse: (!) 106  Temp: 98.2 F (36.8 C)  TempSrc: Oral  SpO2: 97%  Weight: 264 lb 0.6 oz (119.8 kg)  Height: '5\' 8"'  (1.727 m)    General: Well developed, well nourished, in no acute distress  Skin : Warm and dry.  Head: Normocephalic and atraumatic  Eyes: Sclera and conjunctiva clear; pupils round and reactive to light; extraocular movements intact  Ears: External normal; canals clear; tympanic membranes normal  Oropharynx: Pink, supple. No suspicious lesions  Neck: Supple without thyromegaly, adenopathy  Lungs: Respirations unlabored; clear to auscultation bilaterally without wheeze, rales, rhonchi  CVS exam: normal rate and regular rhythm.  Abdomen: Soft; nontender; nondistended; normoactive bowel sounds; no masses or hepatosplenomegaly  Neurologic: Alert and oriented; speech intact; face symmetrical; moves all extremities well; CNII-XII intact without focal deficit   Assessment:  1. Acute bronchitis, unspecified organism     Plan:  Physical exam is reassuring and CXR today was normal; he is finishing his antibiotic today; try adding Prednisone 20 mg qd x 5 days; continue Flonase and Zyrtec; increase fluids, rest and follow-up worse, no better.   No follow-ups on file.  No orders of the defined types were placed in this encounter.   Requested Prescriptions   Signed Prescriptions Disp Refills  . predniSONE (DELTASONE) 20 MG tablet 5 tablet 0    Sig: Take 1 tablet (20 mg total) by mouth daily with breakfast.

## 2017-05-06 NOTE — Patient Instructions (Signed)
Stay on the Flonase and Zyrtec; use the Prednisone 20 mg 1 per day x 5 days; stop the Mucinex Multi- symptom/.

## 2017-05-06 NOTE — Telephone Encounter (Signed)
I scheduled him at 4 pm for follow-up; would like him to come get CXR prior to appointment; usually need to get done 30 minutes prior to appointment; order in place.

## 2017-05-07 ENCOUNTER — Telehealth: Payer: Self-pay | Admitting: Internal Medicine

## 2017-05-07 NOTE — Telephone Encounter (Signed)
Pt. Called to report he noticed the mucus he was coughing up looked dark red today. Instructed pt. To pay attention to mucus. It may change colors as he gets better, but if it stays red to let us know. Pt. And wife verbalize understanding.

## 2017-05-07 NOTE — Progress Notes (Signed)
Reviewed with patient in office;

## 2017-05-13 ENCOUNTER — Encounter (HOSPITAL_COMMUNITY): Payer: Self-pay | Admitting: Family Medicine

## 2017-05-13 ENCOUNTER — Telehealth: Payer: PRIVATE HEALTH INSURANCE | Admitting: Family

## 2017-05-13 ENCOUNTER — Ambulatory Visit (HOSPITAL_COMMUNITY)
Admission: EM | Admit: 2017-05-13 | Discharge: 2017-05-13 | Disposition: A | Discharge status: Home / Self Care | Payer: No Typology Code available for payment source | Attending: Family Medicine | Admitting: Family Medicine

## 2017-05-13 DIAGNOSIS — J320 Chronic maxillary sinusitis: Secondary | ICD-10-CM

## 2017-05-13 DIAGNOSIS — R0982 Postnasal drip: Secondary | ICD-10-CM

## 2017-05-13 DIAGNOSIS — J208 Acute bronchitis due to other specified organisms: Secondary | ICD-10-CM

## 2017-05-13 DIAGNOSIS — R196 Halitosis: Secondary | ICD-10-CM | POA: Diagnosis not present

## 2017-05-13 DIAGNOSIS — B9689 Other specified bacterial agents as the cause of diseases classified elsewhere: Secondary | ICD-10-CM

## 2017-05-13 MED ORDER — BENZONATATE 100 MG PO CAPS: 100.0000 mg | ORAL_CAPSULE | Freq: Three times a day (TID) | ORAL | 0 refills | Status: DC | PRN

## 2017-05-13 MED ORDER — DOXYCYCLINE HYCLATE 100 MG PO TABS: 100.0000 mg | ORAL_TABLET | Freq: Two times a day (BID) | ORAL | 0 refills | Status: DC

## 2017-05-13 MED ORDER — AMOXICILLIN 500 MG PO CAPS: 500.0000 mg | ORAL_CAPSULE | Freq: Three times a day (TID) | ORAL | 0 refills | Status: DC

## 2017-05-13 NOTE — Progress Notes (Signed)
We are sorry that you are not feeling well.  Here is how we plan to help!  Based on your presentation I believe you most likely have A cough due to bacteria.  When patients have a fever and a productive cough with a change in color or increased sputum production, we are concerned about bacterial bronchitis.  If left untreated it can progress to pneumonia.  If your symptoms do not improve with your treatment plan it is important that you contact your provider.   I have prescribed Doxycycline 100 mg twice a day for 7 days     In addition you may use A non-prescription cough medication called Robitussin DAC. Take 2 teaspoons every 8 hours or Delsym: take 2 teaspoons every 12 hours., A non-prescription cough medication called Mucinex DM: take 2 tablets every 12 hours. and A prescription cough medication called Tessalon Perles 100mg. You may take 1-2 capsules every 8 hours as needed for your cough.    From your responses in the eVisit questionnaire you describe inflammation in the upper respiratory tract which is causing a significant cough.  This is commonly called Bronchitis and has four common causes:    Allergies  Viral Infections  Acid Reflux  Bacterial Infection Allergies, viruses and acid reflux are treated by controlling symptoms or eliminating the cause. An example might be a cough caused by taking certain blood pressure medications. You stop the cough by changing the medication. Another example might be a cough caused by acid reflux. Controlling the reflux helps control the cough.  USE OF BRONCHODILATOR ("RESCUE") INHALERS: There is a risk from using your bronchodilator too frequently.  The risk is that over-reliance on a medication which only relaxes the muscles surrounding the breathing tubes can reduce the effectiveness of medications prescribed to reduce swelling and congestion of the tubes themselves.  Although you feel brief relief from the bronchodilator inhaler, your asthma may  actually be worsening with the tubes becoming more swollen and filled with mucus.  This can delay other crucial treatments, such as oral steroid medications. If you need to use a bronchodilator inhaler daily, several times per day, you should discuss this with your provider.  There are probably better treatments that could be used to keep your asthma under control.     HOME CARE . Only take medications as instructed by your medical team. . Complete the entire course of an antibiotic. . Drink plenty of fluids and get plenty of rest. . Avoid close contacts especially the very young and the elderly . Cover your mouth if you cough or cough into your sleeve. . Always remember to wash your hands . A steam or ultrasonic humidifier can help congestion.   GET HELP RIGHT AWAY IF: . You develop worsening fever. . You become short of breath . You cough up blood. . Your symptoms persist after you have completed your treatment plan MAKE SURE YOU   Understand these instructions.  Will watch your condition.  Will get help right away if you are not doing well or get worse.  Your e-visit answers were reviewed by a board certified advanced clinical practitioner to complete your personal care plan.  Depending on the condition, your plan could have included both over the counter or prescription medications. If there is a problem please reply  once you have received a response from your provider. Your safety is important to us.  If you have drug allergies check your prescription carefully.    You can use   MyChart to ask questions about today's visit, request a non-urgent call back, or ask for a work or school excuse for 24 hours related to this e-Visit. If it has been greater than 24 hours you will need to follow up with your provider, or enter a new e-Visit to address those concerns. You will get an e-mail in the next two days asking about your experience.  I hope that your e-visit has been valuable and will  speed your recovery. Thank you for using e-visits.   

## 2017-05-13 NOTE — ED Provider Notes (Signed)
[facility]    CSN: [id number] Arrival date & time: 05/13/17  1726     History   Chief Complaint Chief Complaint  Patient presents with  . Sore Throat    HPI Gregory Owens is a 47 y.o. male. C/O ST, halitosis.  Had  cough but has improved.  TooK Z-pacand steroid   HPI  Past Medical History:  Diagnosis Date  . Abnormal EKG    NS ST-T EKG changes with negative Stress cardiolite 2003  . Anxiety   . Dehydration 2005   [place] , [place]  . Diabetes mellitus without complication ([place])   . Hyperglycemia   . Hyperlipidemia   . IBS (irritable bowel syndrome)   . OSA (obstructive sleep apnea)    resolved post surgery    Patient Active Problem List   Diagnosis Date Noted  . Bilateral foot pain 03/08/2017  . Dysphagia 08/29/2016  . ADHD 03/28/2016  . Morbid obesity ([place]) 12/06/2015  . Preventative health care 03/10/2015  . Skin lesion 03/10/2015  . GERD (gastroesophageal reflux disease) 11/04/2013  . Tachycardia 11/04/2013  . Nonspecific abnormal electrocardiogram (ECG) (EKG) 11/04/2013  . Non-compliant behavior 10/14/2013  . Uncontrolled diabetes mellitus ([place]) 08/19/2012  . PLMD (periodic limb movement disorder) 05/30/2012  . SLEEP DISORDER 12/05/2009  . Hyperlipidemia 08/25/2009  . GAD (generalized anxiety disorder) 08/25/2009  . Obstructive sleep apnea 08/25/2009  . RHINITIS 05/24/2009    Past Surgical History:  Procedure Laterality Date  . TONSILLECTOMY AND ADENOIDECTOMY      with above  . UVULOPALATOPHARYNGOPLASTY  2004   Post op bleeding complication;  Dr Janace Hoard  . WISDOM TOOTH EXTRACTION         Home Medications    Prior to Admission medications   Medication Sig Start Date End Date Taking? Authorizing Provider  albuterol (PROVENTIL HFA;VENTOLIN HFA) 108 (90 Base) MCG/ACT inhaler Inhale 2 puffs into the lungs every 6 (six) hours as needed for wheezing or shortness of breath. 05/02/17  Yes Marrian Salvage, FNP  aspirin 81 MG EC  tablet TAKE 1 TABLET (81 MG TOTAL) BY MOUTH DAILY. 03/25/17   Biagio Borg, MD  BAYER CONTOUR NEXT TEST test strip USE TO CHECK BLOOD SUGAR 2 TIMES DAILY 04/23/16   Biagio Borg, MD  BAYER MICROLET LANCETS lancets Use to help check blood sugar daily Dx E11.9 04/20/15   Biagio Borg, MD  benzonatate (TESSALON PERLES) 100 MG capsule Take 1 capsule (100 mg total) by mouth 3 (three) times daily as needed. 05/13/17   Sharion Balloon, FNP  Blood Glucose Monitoring Suppl (BAYER CONTOUR NEXT MONITOR) w/Device KIT Use as directed to check blood sugars daily Dx e11.9 04/20/15   Biagio Borg, MD  cetirizine (ZYRTEC) 10 MG tablet Take 10 mg by mouth daily.      [provider]  doxycycline (VIBRA-TABS) 100 MG tablet Take 1 tablet (100 mg total) by mouth 2 (two) times daily. 05/13/17   Hawks, Christy A, FNP  EFFEXOR XR 150 MG 24 hr capsule TAKE 1 CAPSULE (150 MG TOTAL) BY MOUTH DAILY. 02/18/17   Biagio Borg, MD  fluticasone (FLONASE) 50 MCG/ACT nasal spray Place 2 sprays into both nostrils daily. 05/02/17   Marrian Salvage, FNP  ketoconazole (NIZORAL) 2 % shampoo APPLY TO SCALP AND FACE AND SHAMPOO TWICE WEEKLY 03/23/17   [provider]  losartan (COZAAR) 25 MG tablet TAKE 1 TABLET (25 MG TOTAL) BY MOUTH DAILY. 09/19/16   Biagio Borg,  MD  metFORMIN (GLUCOPHAGE-XR) 500 MG 24 hr tablet Take 3 tablets (1,500 mg total) by mouth daily with breakfast. 10/10/16   Biagio Borg, MD  pantoprazole (PROTONIX) 40 MG tablet Take 1 tablet (40 mg total) by mouth daily. 03/28/16   Biagio Borg, MD  predniSONE (DELTASONE) 20 MG tablet Take 1 tablet (20 mg total) by mouth daily with breakfast. 05/06/17   Marrian Salvage, FNP  Probiotic Product (ALIGN PO) Take by mouth.    [provider]  rosuvastatin (CRESTOR) 10 MG tablet TAKE 1 TABLET (10 MG TOTAL) BY MOUTH DAILY. 09/19/16   Biagio Borg, MD    Family History Family History  Problem Relation Age of Onset  . Hyperlipidemia Mother   .  Hypertension Mother   . Other Mother        Inflammatory Occipital Lymphadenitis  . Hypertension Father   . Hyperlipidemia Father   . Diabetes Maternal Grandmother   . Heart attack Maternal Grandmother 60  . Diabetes Paternal Grandmother   . Heart attack Paternal Grandmother 52  . Cirrhosis Brother        autoimmune hepatitis (twin)  . Colon cancer Paternal Grandfather     Social History Social History   Tobacco Use  . Smoking status: Never Smoker  . Smokeless tobacco: Never Used  Substance Use Topics  . Alcohol use: No  . Drug use: No     Allergies   Patient has no known allergies.   Review of Systems Review of Systems  Constitutional: Negative.   HENT: Positive for rhinorrhea, sinus pain and sore throat.   Respiratory: Negative.   Cardiovascular: Negative.   Neurological: Negative.      Physical Exam Triage Vital Signs ED Triage Vitals  Enc Vitals Group     BP 05/13/17 1748 130/85     Pulse Rate 05/13/17 1748 (!) 116     Resp 05/13/17 1748 18     Temp 05/13/17 1748 98.4 F (36.9 C)     Temp src --      SpO2 05/13/17 1748 97 %     Weight --      Height --      Head Circumference --      Peak Flow --      Pain Score 05/13/17 1744 5     Pain Loc --      Pain Edu? --      Excl. in [place]? --    No data found.  Updated Vital Signs BP 130/85   Pulse (!) 116   Temp 98.4 F (36.9 C)   Resp 18   SpO2 97%   Visual Acuity Right Eye Distance:   Left Eye Distance:   Bilateral Distance:    Right Eye Near:   Left Eye Near:    Bilateral Near:     Physical Exam  Constitutional: He appears well-developed and well-nourished.  HENT:  Right Ear: No drainage.  Mouth/Throat: Mucous membranes are normal.  Cardiovascular: Normal rate.  Pulmonary/Chest: Effort normal and breath sounds normal.     UC Treatments / Results  Labs (all labs ordered are listed, but only abnormal results are displayed) Labs Reviewed - No data to  display  EKG None Radiology No results found.  Procedures Procedures (including critical care time)  Medications Ordered in UC Medications - No data to display   Initial Impression / Assessment and Plan / UC Course  I have reviewed the triage vital signs and the nursing notes.  Pertinent labs & imaging results that were available during my care of the patient were reviewed by me and considered in my medical decision making (see chart for details).     Sore throat due to pnd also produces bad breath Will treat with Amox and mucinex  Final Clinical Impressions(s) / UC Diagnoses   Final diagnoses:  None    ED Discharge Orders    None      Controlled Substance Prescriptions [place] Controlled Substance Registry consulted? No   Wardell Honour, MD 05/13/17 [phone]

## 2017-05-13 NOTE — ED Triage Notes (Signed)
Pt here for sore throat and trouble swallowing. He said recently treated for bronchitis and is still pretty congested.

## 2017-05-14 ENCOUNTER — Ambulatory Visit: Payer: PRIVATE HEALTH INSURANCE | Admitting: Internal Medicine

## 2017-05-29 ENCOUNTER — Other Ambulatory Visit: Payer: Self-pay | Admitting: Family

## 2017-06-18 ENCOUNTER — Other Ambulatory Visit: Payer: Self-pay | Admitting: Internal Medicine

## 2017-07-08 ENCOUNTER — Other Ambulatory Visit: Payer: Self-pay

## 2017-07-08 MED ORDER — ROSUVASTATIN CALCIUM 10 MG PO TABS: 10.0000 mg | ORAL_TABLET | Freq: Every day | ORAL | 1 refills | Status: DC

## 2017-08-15 ENCOUNTER — Emergency Department (HOSPITAL_COMMUNITY)
Admission: EM | Admit: 2017-08-15 | Discharge: 2017-08-15 | Disposition: A | Discharge status: Home / Self Care | Payer: No Typology Code available for payment source | Attending: Emergency Medicine | Admitting: Emergency Medicine

## 2017-08-15 ENCOUNTER — Other Ambulatory Visit: Payer: Self-pay

## 2017-08-15 ENCOUNTER — Encounter (HOSPITAL_COMMUNITY): Payer: Self-pay | Admitting: Emergency Medicine

## 2017-08-15 ENCOUNTER — Encounter (HOSPITAL_COMMUNITY): Payer: Self-pay

## 2017-08-15 ENCOUNTER — Emergency Department (HOSPITAL_COMMUNITY): Payer: No Typology Code available for payment source

## 2017-08-15 ENCOUNTER — Ambulatory Visit (HOSPITAL_COMMUNITY)
Admission: EM | Admit: 2017-08-15 | Discharge: 2017-08-15 | Disposition: A | Discharge status: Home / Self Care | Payer: No Typology Code available for payment source | Source: Home / Self Care | Attending: Family Medicine | Admitting: Family Medicine

## 2017-08-15 DIAGNOSIS — R079 Chest pain, unspecified: Secondary | ICD-10-CM | POA: Diagnosis present

## 2017-08-15 DIAGNOSIS — Z79899 Other long term (current) drug therapy: Secondary | ICD-10-CM | POA: Insufficient documentation

## 2017-08-15 DIAGNOSIS — R1013 Epigastric pain: Secondary | ICD-10-CM

## 2017-08-15 DIAGNOSIS — Z7984 Long term (current) use of oral hypoglycemic drugs: Secondary | ICD-10-CM | POA: Diagnosis not present

## 2017-08-15 DIAGNOSIS — Z7982 Long term (current) use of aspirin: Secondary | ICD-10-CM | POA: Diagnosis not present

## 2017-08-15 DIAGNOSIS — R0602 Shortness of breath: Secondary | ICD-10-CM | POA: Diagnosis not present

## 2017-08-15 DIAGNOSIS — R072 Precordial pain: Secondary | ICD-10-CM | POA: Insufficient documentation

## 2017-08-15 DIAGNOSIS — E119 Type 2 diabetes mellitus without complications: Secondary | ICD-10-CM | POA: Insufficient documentation

## 2017-08-15 LAB — CBC
HCT: 45.6 % (ref 39.0–52.0)
Hemoglobin: 14.5 g/dL (ref 13.0–17.0)
MCH: 30 pg (ref 26.0–34.0)
MCHC: 31.8 g/dL (ref 30.0–36.0)
MCV: 94.4 fL (ref 78.0–100.0)
Platelets: 253 10*3/uL (ref 150–400)
RBC: 4.83 MIL/uL (ref 4.22–5.81)
RDW: 12.4 % (ref 11.5–15.5)
WBC: 13.4 10*3/uL — ABNORMAL HIGH (ref 4.0–10.5)

## 2017-08-15 LAB — BASIC METABOLIC PANEL
Anion gap: 12 (ref 5–15)
BUN: 13 mg/dL (ref 6–20)
CO2: 29 mmol/L (ref 22–32)
Calcium: 9.5 mg/dL (ref 8.9–10.3)
Chloride: 101 mmol/L (ref 98–111)
Creatinine, Ser: 1.11 mg/dL (ref 0.61–1.24)
GFR calc Af Amer: >60 mL/min (ref >60)
GFR calc non Af Amer: >60 mL/min (ref >60)
Glucose, Bld: 194 mg/dL — ABNORMAL HIGH (ref 70–99)
Potassium: 3.9 mmol/L (ref 3.5–5.1)
Sodium: 142 mmol/L (ref 135–145)

## 2017-08-15 LAB — I-STAT TROPONIN, ED
Troponin i, poc: 0 ng/mL (ref 0.00–0.08)
Troponin i, poc: 0 ng/mL (ref 0.00–0.08)

## 2017-08-15 MED ORDER — GI COCKTAIL ~~LOC~~
30.0000 mL | Freq: Once | ORAL | Status: AC
  Administered 2017-08-15: 30 mL via ORAL
  Filled 2017-08-15: qty 30

## 2017-08-15 MED ORDER — IOPAMIDOL (ISOVUE-370) INJECTION 76%
100.0000 mL | Freq: Once | INTRAVENOUS | Status: AC | PRN
  Administered 2017-08-15: 100 mL via INTRAVENOUS

## 2017-08-15 MED ORDER — MORPHINE SULFATE (PF) 4 MG/ML IV SOLN
4.0000 mg | Freq: Once | INTRAVENOUS | Status: AC
  Administered 2017-08-15: 4 mg via INTRAVENOUS
  Filled 2017-08-15: qty 1

## 2017-08-15 NOTE — ED Notes (Addendum)
Per Dr. Delton SeeNelson pt is being discharged to the ER for further evaluation of chest pain. Patient has family member with him, requesting that she transport him by POV. Pt verbalized understanding to go straight to ER.

## 2017-08-15 NOTE — ED Provider Notes (Signed)
[facility] EMERGENCY DEPARTMENT Provider Note   CSN: [id number] Arrival date & time: 08/15/17  0849     History   Chief Complaint Chief Complaint  Patient presents with  . Chest Pain    HPI Gregory Owens is a 47 y.o. male.  HPI Patient presents to the emergency department with upper abdominal chest discomfort.  The patient states is her last night after eating greasy foods.  He states when he does this he gets really bad acid reflux.  He states he attempted to make himself vomit by forcefully coughing and bulging out his stomach to make himself vomit.  He states he vomited 3 times and then the pain started to get worse.  The patient denies  shortness of breath, headache,blurred vision, neck pain, fever, cough, weakness, numbness, dizziness, anorexia, edema,  nausea, vomiting, diarrhea, rash, back pain, dysuria, hematemesis, bloody stool, near syncope, or syncope. Past Medical History:  Diagnosis Date  . Abnormal EKG    NS ST-T EKG changes with negative Stress cardiolite 2003  . Anxiety   . Dehydration 2005   [place] , [place]  . Diabetes mellitus without complication ([place])   . Hyperglycemia   . Hyperlipidemia   . IBS (irritable bowel syndrome)   . OSA (obstructive sleep apnea)    resolved post surgery    Patient Active Problem List   Diagnosis Date Noted  . Bilateral foot pain 03/08/2017  . Dysphagia 08/29/2016  . ADHD 03/28/2016  . Morbid obesity ([employer]) 12/06/2015  . Preventative health care 03/10/2015  . Skin lesion 03/10/2015  . GERD (gastroesophageal reflux disease) 11/04/2013  . Tachycardia 11/04/2013  . Nonspecific abnormal electrocardiogram (ECG) (EKG) 11/04/2013  . Non-compliant behavior 10/14/2013  . Uncontrolled diabetes mellitus ([place]) 08/19/2012  . PLMD (periodic limb movement disorder) 05/30/2012  . SLEEP DISORDER 12/05/2009  . Hyperlipidemia 08/25/2009  . GAD (generalized anxiety disorder) 08/25/2009  . Obstructive sleep apnea  08/25/2009  . RHINITIS 05/24/2009    Past Surgical History:  Procedure Laterality Date  . TONSILLECTOMY AND ADENOIDECTOMY      with above  . UVULOPALATOPHARYNGOPLASTY  2004   Post op bleeding complication;  Dr Janace Hoard  . WISDOM TOOTH EXTRACTION          Home Medications    Prior to Admission medications   Medication Sig Start Date End Date Taking? Authorizing Provider  albuterol (PROVENTIL HFA;VENTOLIN HFA) 108 (90 Base) MCG/ACT inhaler Inhale 2 puffs into the lungs every 6 (six) hours as needed for wheezing or shortness of breath. 05/02/17   Marrian Salvage, FNP  amoxicillin (AMOXIL) 500 MG capsule Take 1 capsule (500 mg total) by mouth 3 (three) times daily. 05/13/17   Wardell Honour, MD  aspirin 81 MG EC tablet TAKE 1 TABLET (81 MG TOTAL) BY MOUTH DAILY. 03/25/17   Biagio Borg, MD  BAYER CONTOUR NEXT TEST test strip USE TO CHECK BLOOD SUGAR 2 TIMES DAILY 04/23/16   Biagio Borg, MD  BAYER MICROLET LANCETS lancets Use to help check blood sugar daily Dx E11.9 04/20/15   Biagio Borg, MD  benzonatate (TESSALON PERLES) 100 MG capsule Take 1 capsule (100 mg total) by mouth 3 (three) times daily as needed. 05/13/17   Sharion Balloon, FNP  Blood Glucose Monitoring Suppl (BAYER CONTOUR NEXT MONITOR) w/Device KIT Use as directed to check blood sugars daily Dx e11.9 04/20/15   Biagio Borg, MD  cetirizine (ZYRTEC) 10 MG tablet Take 10 mg by mouth daily.  [provider]  EFFEXOR XR 150 MG 24 hr capsule TAKE 1 CAPSULE (150 MG TOTAL) BY MOUTH DAILY. 02/18/17   Biagio Borg, MD  fluticasone (FLONASE) 50 MCG/ACT nasal spray Place 2 sprays into both nostrils daily. 05/02/17   Marrian Salvage, FNP  ketoconazole (NIZORAL) 2 % shampoo APPLY TO SCALP AND FACE AND SHAMPOO TWICE WEEKLY 03/23/17   [provider]  losartan (COZAAR) 25 MG tablet TAKE 1 TABLET (25 MG TOTAL) BY MOUTH DAILY. 06/19/17   Biagio Borg, MD  metFORMIN (GLUCOPHAGE-XR) 500 MG 24 hr tablet TAKE 3  TABLETS BY MOUTH EVERY DAY WITH BREAKFAST 06/19/17   Biagio Borg, MD  pantoprazole (PROTONIX) 40 MG tablet Take 1 tablet (40 mg total) by mouth daily. 03/28/16   Biagio Borg, MD  Probiotic Product (ALIGN PO) Take by mouth.    [provider]  rosuvastatin (CRESTOR) 10 MG tablet Take 1 tablet (10 mg total) by mouth daily. 07/08/17   Biagio Borg, MD    Family History Family History  Problem Relation Age of Onset  . Hyperlipidemia Mother   . Hypertension Mother   . Other Mother        Inflammatory Occipital Lymphadenitis  . Hypertension Father   . Hyperlipidemia Father   . Diabetes Maternal Grandmother   . Heart attack Maternal Grandmother 60  . Diabetes Paternal Grandmother   . Heart attack Paternal Grandmother 13  . Cirrhosis Brother        autoimmune hepatitis (twin)  . Colon cancer Paternal Grandfather     Social History Social History   Tobacco Use  . Smoking status: Never Smoker  . Smokeless tobacco: Never Used  Substance Use Topics  . Alcohol use: No  . Drug use: No     Allergies   Patient has no known allergies.   Review of Systems Review of Systems All other systems negative except as documented in the HPI. All pertinent positives and negatives as reviewed in the HPI.  Physical Exam Updated Vital Signs BP (!) 116/101   Pulse 98   Resp (!) 27   SpO2 98%   Physical Exam  Constitutional: He is oriented to person, place, and time. He appears well-developed and well-nourished. No distress.  HENT:  Head: Normocephalic and atraumatic.  Mouth/Throat: Oropharynx is clear and moist.  Eyes: Pupils are equal, round, and reactive to light.  Neck: Normal range of motion. Neck supple.  Cardiovascular: Normal rate, regular rhythm and normal heart sounds. Exam reveals no gallop and no friction rub.  No murmur heard. Pulmonary/Chest: Effort normal and breath sounds normal. No respiratory distress. He has no wheezes.  Abdominal: Soft. Bowel sounds are  normal. He exhibits no distension. There is no tenderness.    Neurological: He is alert and oriented to person, place, and time. He exhibits normal muscle tone. Coordination normal.  Skin: Skin is warm and dry. Capillary refill takes less than 2 seconds. No rash noted. No erythema.  Psychiatric: He has a normal mood and affect. His behavior is normal.  Nursing note and vitals reviewed.    ED Treatments / Results  Labs (all labs ordered are listed, but only abnormal results are displayed) Labs Reviewed  BASIC METABOLIC PANEL - Abnormal; Notable for the following components:      Result Value   Glucose, Bld 194 (*)    All other components within normal limits  CBC - Abnormal; Notable for the following components:   WBC 13.4 (*)  All other components within normal limits  I-STAT TROPONIN, ED  I-STAT TROPONIN, ED    EKG EKG Interpretation  Date/Time:  Thursday August 15 2017 09:01:48 EDT Ventricular Rate:  117 PR Interval:  132 QRS Duration: 74 QT Interval:  312 QTC Calculation: 435 R Axis:   50 Text Interpretation:  Sinus tachycardia Cannot rule out Anterior infarct , age undetermined T wave abnormality No significant change since last tracing Abnormal ekg Confirmed by Carmin Muskrat [phone]) on 08/15/2017 10:04:01 AM   Radiology Dg Chest 2 View  Result Date: 08/15/2017 CLINICAL DATA:  Chest pain.  Hypertension. EXAM: CHEST - 2 VIEW COMPARISON:  May 06, 2017 FINDINGS: There is patchy bibasilar atelectatic change. Lungs elsewhere clear. Heart size and pulmonary vascularity normal. No adenopathy. No pneumothorax. No bone lesions. IMPRESSION: Patchy bibasilar atelectasis. No edema or consolidation. Stable cardiac silhouette. Electronically Signed   By: Lowella Grip III M.D.   On: 08/15/2017 09:57   Ct Angio Chest Pe W/cm &/or Wo Cm  Result Date: 08/15/2017 CLINICAL DATA:  Stabbing chest pain with shortness of breath. EXAM: CT ANGIOGRAPHY CHEST WITH CONTRAST TECHNIQUE:  Multidetector CT imaging of the chest was performed using the standard protocol during bolus administration of intravenous contrast. Multiplanar CT image reconstructions and MIPs were obtained to evaluate the vascular anatomy. CONTRAST:  139m ISOVUE-370 IOPAMIDOL (ISOVUE-370) INJECTION 76% COMPARISON:  Chest x-ray August 15, 2017 FINDINGS: Cardiovascular: Satisfactory opacification of the pulmonary arteries to the segmental level. No evidence of pulmonary embolism. Normal heart size. No pericardial effusion. Mediastinum/Nodes: No enlarged mediastinal, hilar, or axillary lymph nodes. Thyroid gland, trachea, and esophagus demonstrate no significant findings. Lungs/Pleura: Lungs are clear. No pleural effusion or pneumothorax. Minimal atelectasis of the left lung base is identified. Upper Abdomen: No acute abnormality. Musculoskeletal: Degenerative joint changes of the spine are noted. Review of the MIP images confirms the above findings. IMPRESSION: No pulmonary embolus. No focal pneumonia.  Minimal atelectasis of left lung base. Electronically Signed   By: WAbelardo DieselM.D.   On: 08/15/2017 14:04    Procedures Procedures (including critical care time)  Medications Ordered in ED Medications  gi cocktail (Maalox,Lidocaine,Donnatal) (has no administration in time range)  morphine 4 MG/ML injection 4 mg (4 mg Intravenous Given 08/15/17 1244)  iopamidol (ISOVUE-370) 76 % injection 100 mL (100 mLs Intravenous Contrast Given 08/15/17 1340)     Initial Impression / Assessment and Plan / ED Course  I have reviewed the triage vital signs and the nursing notes.  Pertinent labs & imaging results that were available during my care of the patient were reviewed by me and considered in my medical decision making (see chart for details).  Clinical Course as of Aug 15 1445  Thu Aug 15, 2017  1341 CT Angio Chest PE W/Cm &/Or Wo Cm [NL]  1348 CT Angio Chest PE W/Cm &/Or Wo Cm [NL]    Clinical Course User Index [NL]  LJunious Dresser Student-PA    I feel that the patient straining last night trying to resolve his GERD symptoms by vomiting.  This irritated the abdominal wall.  Told to follow-up with his primary doctor.  Final Clinical Impressions(s) / ED Diagnoses   Final diagnoses:  None    ED Discharge Orders    None       LDalia Heading PA-C 08/15/17 1454    LCarmin Muskrat MD 08/15/17 1504

## 2017-08-15 NOTE — ED Triage Notes (Signed)
Patient complains central chest pain that started last night at 2100, states he is unable to tell if it is GERD or something else. States he consumed several beverages and foods yesterday that he knows exacerbate his GERD symptoms, but also states he took several antacids without relief. Patient alert, oriented, and in no apparent distress.

## 2017-08-15 NOTE — ED Triage Notes (Signed)
Pt presents with substernal chest pain that is stabbing in nature, non radiating. Complaints of associated shortness of breath. Pt states that he has really bad GERD and this is what it normally feels like, however it usually has subsided by now. Onset last night after dinner.

## 2017-08-15 NOTE — Discharge Instructions (Addendum)
Return here as needed.  Follow-up with your doctor.  Your testing here today did not show any abnormalities.

## 2017-08-19 ENCOUNTER — Ambulatory Visit (INDEPENDENT_AMBULATORY_CARE_PROVIDER_SITE_OTHER): Payer: No Typology Code available for payment source | Admitting: Internal Medicine

## 2017-08-19 ENCOUNTER — Encounter: Payer: Self-pay | Admitting: Internal Medicine

## 2017-08-19 DIAGNOSIS — K219 Gastro-esophageal reflux disease without esophagitis: Secondary | ICD-10-CM

## 2017-08-19 MED ORDER — FAMOTIDINE 40 MG PO TABS: 40.0000 mg | ORAL_TABLET | Freq: Every day | ORAL | 3 refills | Status: DC

## 2017-08-19 NOTE — Progress Notes (Signed)
   Subjective:    Patient ID: Gregory Owens, male    DOB: [birth year], 47 y.o.   MRN: [id number]  HPI The patient is a 47 YO man coming in for ER follow up (in for epigastric and chest pains, negative EKG and troponin and CTA chest, given GI cocktail). He was having a bad GERD flare the day prior to ER and tried pepto bismol which did not help much. He then tried to make himself vomit several times which sometimes helps his symptoms and he did vomit (no blood) but the GERD did not get better. He has been feeling okay since leaving the ER. He is having some pain in the epigastric region which is sore (3/10) all the time. He has not taken anything for that. Also still having flares of his GERD. Called his GI doctor and has not heard back yet.   Review of Systems  Constitutional: Negative.   HENT: Negative.   Eyes: Negative.   Respiratory: Negative for cough, chest tightness and shortness of breath.   Cardiovascular: Positive for chest pain. Negative for palpitations and leg swelling.  Gastrointestinal: Positive for abdominal distention, abdominal pain and vomiting. Negative for anal bleeding, blood in stool, constipation, diarrhea and nausea.  Musculoskeletal: Negative.   Skin: Negative.   Neurological: Negative.   Psychiatric/Behavioral: Negative.       Objective:   Physical Exam  Constitutional: He is oriented to person, place, and time. He appears well-developed and well-nourished.  obese  HENT:  Head: Normocephalic and atraumatic.  Eyes: EOM are normal.  Neck: Normal range of motion.  Cardiovascular: Normal rate and regular rhythm.  Pulmonary/Chest: Effort normal and breath sounds normal. No respiratory distress. He has no wheezes. He has no rales.  Abdominal: Soft. Bowel sounds are normal. He exhibits no distension. There is no tenderness. There is no rebound.  Large abdomen  Musculoskeletal: He exhibits no edema.  Neurological: He is alert and oriented to person, place,  and time. Coordination normal.  Skin: Skin is warm and dry.  Psychiatric: He has a normal mood and affect.   Vitals:   08/19/17 1100  BP: 122/80  Pulse: (!) 104  Resp: 18  Temp: 98.7 F (37.1 C)  TempSrc: Oral  SpO2: 96%  Weight: 266 lb (120.7 kg)  Height: 5\' 8"  (1.727 m)      Assessment & Plan:

## 2017-08-19 NOTE — Assessment & Plan Note (Signed)
No prior EGD, asked him to follow up with GI. He is having severe GERD symptoms with BID PPI. Rx for pepcid qhs to see if this helps symptoms. Given information about lifestyle changes to help as well. Likely needs EGD.

## 2017-08-19 NOTE — Patient Instructions (Signed)
We have sent in pepcid to take in the evening time to help with symptoms. Make sure not to eat within 3 hours of bedtime.   Think about moving the head of the bed up to help decrease symptoms.  You can take tums, maalox, pepto bismol when symptoms strike.   Food Choices for Gastroesophageal Reflux Disease, Adult When you have gastroesophageal reflux disease (GERD), the foods you eat and your eating habits are very important. Choosing the right foods can help ease your discomfort. What guidelines do I need to follow?  Choose fruits, vegetables, whole grains, and low-fat dairy products.  Choose low-fat meat, fish, and poultry.  Limit fats such as oils, salad dressings, butter, nuts, and avocado.  Keep a food diary. This helps you identify foods that cause symptoms.  Avoid foods that cause symptoms. These may be different for everyone.  Eat small meals often instead of 3 large meals a day.  Eat your meals slowly, in a place where you are relaxed.  Limit fried foods.  Cook foods using methods other than frying.  Avoid drinking alcohol.  Avoid drinking large amounts of liquids with your meals.  Avoid bending over or lying down until 2-3 hours after eating. What foods are not recommended? These are some foods and drinks that may make your symptoms worse: Vegetables Tomatoes. Tomato juice. Tomato and spaghetti sauce. Chili peppers. Onion and garlic. Horseradish. Fruits Oranges, grapefruit, and lemon (fruit and juice). Meats High-fat meats, fish, and poultry. This includes hot dogs, ribs, ham, sausage, salami, and bacon. Dairy Whole milk and chocolate milk. Sour cream. Cream. Butter. Ice cream. Cream cheese. Drinks Coffee and tea. Bubbly (carbonated) drinks or energy drinks. Condiments Hot sauce. Barbecue sauce. Sweets/Desserts Chocolate and cocoa. Donuts. Peppermint and spearmint. Fats and Oils High-fat foods. This includes Jamaica[place] fries and potato chips. Other Vinegar.  Strong spices. This includes black pepper, white pepper, red pepper, cayenne, curry powder, cloves, ginger, and chili powder. The items listed above may not be a complete list of foods and drinks to avoid. Contact your dietitian for more information. This information is not intended to replace advice given to you by your health care provider. Make sure you discuss any questions you have with your health care provider. Document Released: 07/17/2011 Document Revised: 06/23/2015 Document Reviewed: 11/19/2012 Elsevier Interactive Patient Education  2017 [employer]

## 2017-08-21 ENCOUNTER — Ambulatory Visit: Payer: No Typology Code available for payment source | Admitting: Internal Medicine

## 2017-08-21 DIAGNOSIS — Z0289 Encounter for other administrative examinations: Secondary | ICD-10-CM

## 2017-08-26 ENCOUNTER — Encounter: Payer: Self-pay | Admitting: Internal Medicine

## 2017-08-26 DIAGNOSIS — R5383 Other fatigue: Secondary | ICD-10-CM

## 2017-08-29 ENCOUNTER — Other Ambulatory Visit (INDEPENDENT_AMBULATORY_CARE_PROVIDER_SITE_OTHER): Payer: No Typology Code available for payment source

## 2017-08-29 DIAGNOSIS — E1165 Type 2 diabetes mellitus with hyperglycemia: Secondary | ICD-10-CM | POA: Diagnosis not present

## 2017-08-29 DIAGNOSIS — R5383 Other fatigue: Secondary | ICD-10-CM | POA: Diagnosis not present

## 2017-08-29 LAB — HEPATIC FUNCTION PANEL
ALT: 24 U/L (ref 0–53)
AST: 13 U/L (ref 0–37)
Albumin: 3.9 g/dL (ref 3.5–5.2)
Alkaline Phosphatase: 105 U/L (ref 39–117)
Bilirubin, Direct: 0.2 mg/dL (ref 0.0–0.3)
Total Bilirubin: 0.4 mg/dL (ref 0.2–1.2)
Total Protein: 7.4 g/dL (ref 6.0–8.3)

## 2017-08-29 LAB — CBC WITH DIFFERENTIAL/PLATELET
Basophils Absolute: 0 10*3/uL (ref 0.0–0.1)
Basophils Relative: 0.5 % (ref 0.0–3.0)
Eosinophils Absolute: 0.3 10*3/uL (ref 0.0–0.7)
Eosinophils Relative: 3.4 % (ref 0.0–5.0)
HCT: 41.9 % (ref 39.0–52.0)
Hemoglobin: 13.7 g/dL (ref 13.0–17.0)
Lymphocytes Relative: 28.4 % (ref 12.0–46.0)
Lymphs Abs: 2.7 10*3/uL (ref 0.7–4.0)
MCHC: 32.6 g/dL (ref 30.0–36.0)
MCV: 91.9 fl (ref 78.0–100.0)
Monocytes Absolute: 0.5 10*3/uL (ref 0.1–1.0)
Monocytes Relative: 5.2 % (ref 3.0–12.0)
Neutro Abs: 5.9 10*3/uL (ref 1.4–7.7)
Neutrophils Relative %: 62.5 % (ref 43.0–77.0)
Platelets: 304 10*3/uL (ref 150.0–400.0)
RBC: 4.56 Mil/uL (ref 4.22–5.81)
RDW: 13.4 % (ref 11.5–15.5)
WBC: 9.4 10*3/uL (ref 4.0–10.5)

## 2017-08-29 LAB — BASIC METABOLIC PANEL
BUN: 14 mg/dL (ref 6–23)
CO2: 28 mEq/L (ref 19–32)
Calcium: 9.1 mg/dL (ref 8.4–10.5)
Chloride: 103 mEq/L (ref 96–112)
Creatinine, Ser: 0.94 mg/dL (ref 0.40–1.50)
GFR: 110.06 mL/min (ref >60.00)
Glucose, Bld: 143 mg/dL — ABNORMAL HIGH (ref 70–99)
Potassium: 3.4 mEq/L — ABNORMAL LOW (ref 3.5–5.1)
Sodium: 139 mEq/L (ref 135–145)

## 2017-08-29 LAB — LIPID PANEL
Cholesterol: 109 mg/dL (ref 0–200)
HDL: 32.6 mg/dL — ABNORMAL LOW (ref >39.00)
NonHDL: 76.47
Total CHOL/HDL Ratio: 3
Triglycerides: 219 mg/dL — ABNORMAL HIGH (ref 0.0–149.0)
VLDL: 43.8 mg/dL — ABNORMAL HIGH (ref 0.0–40.0)

## 2017-08-29 LAB — LDL CHOLESTEROL, DIRECT: Direct LDL: 53 mg/dL

## 2017-08-29 LAB — TESTOSTERONE: Testosterone: 202.44 ng/dL — ABNORMAL LOW (ref 300.00–890.00)

## 2017-08-29 LAB — HEMOGLOBIN A1C: Hgb A1c MFr Bld: 6.9 % — ABNORMAL HIGH (ref 4.6–6.5)

## 2017-09-04 ENCOUNTER — Ambulatory Visit (INDEPENDENT_AMBULATORY_CARE_PROVIDER_SITE_OTHER): Payer: No Typology Code available for payment source | Admitting: Internal Medicine

## 2017-09-04 ENCOUNTER — Encounter: Payer: Self-pay | Admitting: Internal Medicine

## 2017-09-04 VITALS — BP 112/78 | HR 101 | Ht 68.0 in | Wt 272.0 lb

## 2017-09-04 DIAGNOSIS — E291 Testicular hypofunction: Secondary | ICD-10-CM

## 2017-09-04 DIAGNOSIS — K219 Gastro-esophageal reflux disease without esophagitis: Secondary | ICD-10-CM | POA: Diagnosis not present

## 2017-09-04 DIAGNOSIS — E1165 Type 2 diabetes mellitus with hyperglycemia: Secondary | ICD-10-CM | POA: Diagnosis not present

## 2017-09-04 MED ORDER — TESTOSTERONE 50 MG/5GM (1%) TD GEL: 5.0000 g | Freq: Every day | TRANSDERMAL | 1 refills | Status: DC

## 2017-09-04 MED ORDER — TESTOSTERONE CYPIONATE 200 MG/ML IM SOLN: 200.0000 mg | INTRAMUSCULAR | 1 refills | Status: DC

## 2017-09-04 NOTE — Assessment & Plan Note (Addendum)
Ok for start testost replacement and f/u level in 4 wks

## 2017-09-04 NOTE — Progress Notes (Signed)
Subjective:    Patient ID: Gregory Owens, male    DOB: April 04, 1970, 47 y.o.   MRN: [id number]  HPI  Here to f/u recent findings of low testosterone.  C/o low energy, low stamina, cant seem to lose wt, but no ED or other.  Also seen with indigestion in ED 7/18 now improved, but still has some breakthrough at night with lying on the left side, but not on the right.  Also has a tender spot to the lower mid chest without rash or swelling, seemed to start last wk after lifting weights.  Pt denies increased sob or doe, wheezing, orthopnea, PND, increased LE swelling, palpitations, dizziness or syncope.  No other new complaints Past Medical History:  Diagnosis Date  . Abnormal EKG    NS ST-T EKG changes with negative Stress cardiolite 2003  . Anxiety   . Dehydration 2005   [place] , [place]  . Diabetes mellitus without complication ([place])   . Hyperglycemia   . Hyperlipidemia   . IBS (irritable bowel syndrome)   . OSA (obstructive sleep apnea)    resolved post surgery   Past Surgical History:  Procedure Laterality Date  . TONSILLECTOMY AND ADENOIDECTOMY      with above  . UVULOPALATOPHARYNGOPLASTY  2004   Post op bleeding complication;  Dr Janace Hoard  . WISDOM TOOTH EXTRACTION      reports that he has never smoked. He has never used smokeless tobacco. He reports that he does not drink alcohol or use drugs. family history includes Cirrhosis in his brother; Colon cancer in his paternal grandfather; Diabetes in his maternal grandmother and paternal grandmother; Heart attack (age of onset: 68) in his maternal grandmother and paternal grandmother; Hyperlipidemia in his father and mother; Hypertension in his father and mother; Other in his mother. No Known Allergies Current Outpatient Medications on File Prior to Visit  Medication Sig Dispense Refill  . albuterol (PROVENTIL HFA;VENTOLIN HFA) 108 (90 Base) MCG/ACT inhaler Inhale 2 puffs into the lungs every 6 (six) hours as needed for wheezing or  shortness of breath. 1 Inhaler 0  . aspirin 81 MG EC tablet TAKE 1 TABLET (81 MG TOTAL) BY MOUTH DAILY. 90 tablet 3  . BAYER CONTOUR NEXT TEST test strip USE TO CHECK BLOOD SUGAR 2 TIMES DAILY 100 each 1  . BAYER MICROLET LANCETS lancets Use to help check blood sugar daily Dx E11.9 100 each 3  . Blood Glucose Monitoring Suppl (BAYER CONTOUR NEXT MONITOR) w/Device KIT Use as directed to check blood sugars daily Dx e11.9 1 kit 0  . cetirizine (ZYRTEC) 10 MG tablet Take 10 mg by mouth daily.      . EFFEXOR XR 150 MG 24 hr capsule TAKE 1 CAPSULE (150 MG TOTAL) BY MOUTH DAILY. 90 capsule 2  . famotidine (PEPCID) 40 MG tablet Take 1 tablet (40 mg total) by mouth at bedtime. 30 tablet 3  . fluticasone (FLONASE) 50 MCG/ACT nasal spray Place 2 sprays into both nostrils daily. 16 g 6  . ketoconazole (NIZORAL) 2 % shampoo APPLY TO SCALP AND FACE AND SHAMPOO TWICE WEEKLY  6  . losartan (COZAAR) 25 MG tablet TAKE 1 TABLET (25 MG TOTAL) BY MOUTH DAILY. 90 tablet 2  . metFORMIN (GLUCOPHAGE-XR) 500 MG 24 hr tablet TAKE 3 TABLETS BY MOUTH EVERY DAY WITH BREAKFAST 270 tablet 2  . pantoprazole (PROTONIX) 40 MG tablet Take 1 tablet (40 mg total) by mouth daily. 90 tablet 3  . Probiotic Product (ALIGN PO) Take  by mouth.    . rosuvastatin (CRESTOR) 10 MG tablet Take 1 tablet (10 mg total) by mouth daily. 90 tablet 1   No current facility-administered medications on file prior to visit.    Review of Systems  Constitutional: Negative for other unusual diaphoresis or sweats HENT: Negative for ear discharge or swelling Eyes: Negative for other worsening visual disturbances Respiratory: Negative for stridor or other swelling  Gastrointestinal: Negative for worsening distension or other blood Genitourinary: Negative for retention or other urinary change Musculoskeletal: Negative for other MSK pain or swelling Skin: Negative for color change or other new lesions Neurological: Negative for worsening tremors and other  numbness  Psychiatric/Behavioral: Negative for worsening agitation or other fatigue All other system neg per pt    Objective:   Physical Exam BP 112/78 (BP Location: Left Arm, Patient Position: Sitting, Cuff Size: Large)   Pulse (!) 101   Ht '5\' 8"'  (1.727 m)   Wt 272 lb (123.4 kg)   SpO2 96%   BMI 41.36 kg/m  VS noted,  Constitutional: Pt appears in NAD HENT: Head: NCAT.  Right Ear: External ear normal.  Left Ear: External ear normal.  Eyes: . Pupils are equal, round, and reactive to light. Conjunctivae and EOM are normal Nose: without d/c or deformity Neck: Neck supple. Gross normal ROM Cardiovascular: Normal rate and regular rhythm.   Pulmonary/Chest: Effort normal and breath sounds without rales or wheezing.  Abd:  Soft, NT, ND, + BS, no organomegaly Neurological: Pt is alert. At baseline orientation, motor grossly intact Skin: Skin is warm. No rashes, other new lesions, no LE edema Psychiatric: Pt behavior is normal without agitation  No other exam findings Lab Results  Component Value Date   WBC 9.4 08/29/2017   HGB 13.7 08/29/2017   HCT 41.9 08/29/2017   PLT 304.0 08/29/2017   GLUCOSE 143 (H) 08/29/2017   CHOL 109 08/29/2017   TRIG 219.0 (H) 08/29/2017   HDL 32.60 (L) 08/29/2017   LDLDIRECT 53.0 08/29/2017   LDLCALC 55 03/08/2017   ALT 24 08/29/2017   AST 13 08/29/2017   NA 139 08/29/2017   K 3.4 (L) 08/29/2017   CL 103 08/29/2017   CREATININE 0.94 08/29/2017   BUN 14 08/29/2017   CO2 28 08/29/2017   TSH 1.99 03/08/2017   PSA 0.62 03/08/2017   HGBA1C 6.9 (H) 08/29/2017   MICROALBUR 1.0 03/08/2017       Assessment & Plan:

## 2017-09-04 NOTE — Assessment & Plan Note (Signed)
stable overall by history and exam, recent data reviewed with pt, and pt to continue medical treatment as before,  to f/u any worsening symptoms or concerns Lab Results  Component Value Date   HGBA1C 6.9 (H) 08/29/2017

## 2017-09-04 NOTE — Assessment & Plan Note (Signed)
Overall stable, to cont PPI

## 2017-09-04 NOTE — Patient Instructions (Signed)
Please take all new medication as prescribed - the androgel  Please return in 1 month to the LAB only for a repeat testosterone  Please continue all other medications as before, and refills have been done if requested.  Please have the pharmacy call with any other refills you may need.  Please continue your efforts at being more active, low cholesterol diet, and weight control.  Please keep your appointments with your specialists as you may have planned

## 2017-09-06 ENCOUNTER — Telehealth: Payer: Self-pay | Admitting: Internal Medicine

## 2017-09-06 NOTE — Telephone Encounter (Signed)
Copied from CRM [phone]. Topic: Quick Communication - See Telephone Encounter >> Sep 06, 2017  5:03 PM Lorrine KinMcGee, Karas Pickerill B, [place] wrote: CRM for notification. See Telephone encounter for: 09/06/17. Patient calling and [facility] [address] [facility] (TESTIM) 50 MG/5GM (1%) GEL and  testosterone cypionate (DEPO-TESTOSTERONE) 200 MG/ML injection on back order. States that he called the [id number] - [place], [name] - [address] [facility] [address] and they have the medications. Please advise. [facility] [address].

## 2017-09-09 MED ORDER — TESTOSTERONE CYPIONATE 200 MG/ML IM SOLN: 200.0000 mg | INTRAMUSCULAR | 1 refills | Status: DC

## 2017-09-09 NOTE — Telephone Encounter (Signed)
Patient called and said that [facility] did not have the testosterone Cypionate in stock. [facility] [address] [facility] did [facility] the testosterone cypionate in stock.    [facility] [address] [phone]?

## 2017-09-09 NOTE — Telephone Encounter (Signed)
Pt calling to check on the status of his medication refill request

## 2017-09-09 NOTE — Telephone Encounter (Signed)
Done erx 

## 2017-09-09 NOTE — Telephone Encounter (Addendum)
Done erx 

## 2017-09-09 NOTE — Telephone Encounter (Signed)
Updated pharmacy pls [facility],../lmb

## 2017-09-09 NOTE — Telephone Encounter (Signed)
The Testosterone Cypionate.[name].Raechel Chute/lmb

## 2017-09-09 NOTE — Telephone Encounter (Signed)
We would not normally be able to do both  Does he prefer one? thanks

## 2017-09-09 NOTE — Addendum Note (Signed)
Addended by: Corwin LevinsJOHN, Rosaline Ezekiel W on: 09/09/2017 03:40 PM   Modules accepted: Orders

## 2017-09-18 ENCOUNTER — Ambulatory Visit (INDEPENDENT_AMBULATORY_CARE_PROVIDER_SITE_OTHER): Payer: No Typology Code available for payment source | Admitting: Internal Medicine

## 2017-09-18 ENCOUNTER — Encounter: Payer: Self-pay | Admitting: Internal Medicine

## 2017-09-18 VITALS — BP 126/76 | HR 86 | Temp 98.5°F | Resp 18 | Ht 68.0 in | Wt 268.0 lb

## 2017-09-18 DIAGNOSIS — L989 Disorder of the skin and subcutaneous tissue, unspecified: Secondary | ICD-10-CM | POA: Diagnosis not present

## 2017-09-18 NOTE — Patient Instructions (Signed)
Monitor your foot and if the discoloration does not go away we should have you see a dermatologist.

## 2017-09-18 NOTE — Assessment & Plan Note (Signed)
Right foot on plantar surface Appears to be a bruise under the skin Area is nontender, no swelling, no change in sensation, no wound A benign-appearing - possible bruise of broken blood vessel Since he just noticed it today he will just monitor If this does not resolve or worsens should see dermatology or follow-up with PCP

## 2017-09-18 NOTE — Progress Notes (Signed)
Subjective:    Patient ID: Gregory Owens, male    DOB: 1970-07-16, 47 y.o.   MRN: [id number]  HPI The patient is here for an acute visit.   Today he noticed a spot on the bottom of his right foot.  He is unsure how long it has been there.  He denies injuries.  He sometimes does not wear socks with his shoes.  His sugars have been well controlled.  He has no numbness/tingling in his feet.  He has been exercising more in the past three months.       Medications and allergies reviewed with patient and updated if appropriate.  Patient Active Problem List   Diagnosis Date Noted  . Hypogonadism in male 09/04/2017  . Bilateral foot pain 03/08/2017  . Dysphagia 08/29/2016  . ADHD 03/28/2016  . Morbid obesity ([place]) 12/06/2015  . Preventative health care 03/10/2015  . Skin lesion 03/10/2015  . GERD (gastroesophageal reflux disease) 11/04/2013  . Tachycardia 11/04/2013  . Nonspecific abnormal electrocardiogram (ECG) (EKG) 11/04/2013  . Non-compliant behavior 10/14/2013  . Uncontrolled diabetes mellitus ([place]) 08/19/2012  . PLMD (periodic limb movement disorder) 05/30/2012  . SLEEP DISORDER 12/05/2009  . Hyperlipidemia 08/25/2009  . GAD (generalized anxiety disorder) 08/25/2009  . Obstructive sleep apnea 08/25/2009  . RHINITIS 05/24/2009    Current Outpatient Medications on File Prior to Visit  Medication Sig Dispense Refill  . albuterol (PROVENTIL HFA;VENTOLIN HFA) 108 (90 Base) MCG/ACT inhaler Inhale 2 puffs into the lungs every 6 (six) hours as needed for wheezing or shortness of breath. 1 Inhaler 0  . aspirin 81 MG EC tablet TAKE 1 TABLET (81 MG TOTAL) BY MOUTH DAILY. 90 tablet 3  . BAYER CONTOUR NEXT TEST test strip USE TO CHECK BLOOD SUGAR 2 TIMES DAILY 100 each 1  . BAYER MICROLET LANCETS lancets Use to help check blood sugar daily Dx E11.9 100 each 3  . Blood Glucose Monitoring Suppl (BAYER CONTOUR NEXT MONITOR) w/Device KIT Use as directed to check blood sugars  daily Dx e11.9 1 kit 0  . cetirizine (ZYRTEC) 10 MG tablet Take 10 mg by mouth daily.      . EFFEXOR XR 150 MG 24 hr capsule TAKE 1 CAPSULE (150 MG TOTAL) BY MOUTH DAILY. 90 capsule 2  . famotidine (PEPCID) 40 MG tablet Take 1 tablet (40 mg total) by mouth at bedtime. 30 tablet 3  . fluticasone (FLONASE) 50 MCG/ACT nasal spray Place 2 sprays into both nostrils daily. 16 g 6  . ketoconazole (NIZORAL) 2 % shampoo APPLY TO SCALP AND FACE AND SHAMPOO TWICE WEEKLY  6  . losartan (COZAAR) 25 MG tablet TAKE 1 TABLET (25 MG TOTAL) BY MOUTH DAILY. 90 tablet 2  . metFORMIN (GLUCOPHAGE-XR) 500 MG 24 hr tablet TAKE 3 TABLETS BY MOUTH EVERY DAY WITH BREAKFAST 270 tablet 2  . pantoprazole (PROTONIX) 40 MG tablet Take 1 tablet (40 mg total) by mouth daily. 90 tablet 3  . Probiotic Product (ALIGN PO) Take by mouth.    . rosuvastatin (CRESTOR) 10 MG tablet Take 1 tablet (10 mg total) by mouth daily. 90 tablet 1  . testosterone cypionate (DEPO-TESTOSTERONE) 200 MG/ML injection Inject 1 mL (200 mg total) into the muscle every 14 (fourteen) days. 10 mL 1   No current facility-administered medications on file prior to visit.     Past Medical History:  Diagnosis Date  . Abnormal EKG    NS ST-T EKG changes with negative Stress cardiolite 2003  .  Anxiety   . Dehydration 2005   [place] , [place]  . Diabetes mellitus without complication ([place])   . Hyperglycemia   . Hyperlipidemia   . IBS (irritable bowel syndrome)   . OSA (obstructive sleep apnea)    resolved post surgery    Past Surgical History:  Procedure Laterality Date  . TONSILLECTOMY AND ADENOIDECTOMY      with above  . UVULOPALATOPHARYNGOPLASTY  2004   Post op bleeding complication;  Dr Janace Hoard  . WISDOM TOOTH EXTRACTION      Social History   Socioeconomic History  . Marital status: Single    Spouse name: Not on file  . Number of children: Not on file  . Years of education: Not on file  . Highest education level: Not on file  Occupational  History  . 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 Financial resource strain: Not on file  . Food insecurity:    Worry: Not on file    Inability: Not on file  . Transportation needs:    Medical: Not on file    Non-medical: Not on file  Tobacco Use  . Smoking status: Never Smoker  . Smokeless tobacco: Never Used  Substance and Sexual Activity  . Alcohol use: No  . Drug use: No  . Sexual activity: Not on file  Lifestyle  . Physical activity:    Days per week: Not on file    Minutes per session: Not on file  . Stress: Not on file  Relationships  . Social connections:    Talks on phone: Not on file    Gets together: Not on file    Attends religious service: Not on file    Active member of club or organization: Not on file    Attends meetings of clubs or organizations: Not on file    Relationship status: Not on file  Other Topics Concern  . Not on file  Social History Narrative   Regular exercise: no             Family History  Problem Relation Age of Onset  . Hyperlipidemia Mother   . Hypertension Mother   . Other Mother        Inflammatory Occipital Lymphadenitis  . Hypertension Father   . Hyperlipidemia Father   . Diabetes Maternal Grandmother   . Heart attack Maternal Grandmother 60  . Diabetes Paternal Grandmother   . Heart attack Paternal Grandmother 78  . Cirrhosis Brother        autoimmune hepatitis (twin)  . Colon cancer Paternal Grandfather     Review of Systems  Constitutional: Negative for chills and fever.  Skin: Positive for color change. Negative for rash and wound.  Neurological: Negative for weakness and numbness.       Objective:   Vitals:   09/18/17 1436  BP: 126/76  Pulse: 86  Resp: 18  Temp: 98.5 F (36.9 C)  SpO2: 96%   BP Readings from Last 3 Encounters:  09/18/17 126/76  09/04/17 112/78  08/19/17 122/80   Wt Readings from Last 3 Encounters:  09/18/17 268 lb (121.6 kg)  09/04/17  272 lb (123.4 kg)  08/19/17 266 lb (120.7 kg)   Body mass index is 40.75 kg/m.   Physical Exam  Constitutional: He appears well-developed and well-nourished. No distress.  Musculoskeletal: He exhibits no edema.  Neurological: No sensory deficit (right foot).  Skin: Skin is warm and dry. He is not diaphoretic.  Mid plantar surface on medial aspect - mid arch - purplist bruising appearing lesion - no superficial - appears to be under the skin. Non tender.. No swelling.  No wound           Assessment & Plan:    See Problem List for Assessment and Plan of chronic medical problems.

## 2017-10-11 ENCOUNTER — Ambulatory Visit: Payer: No Typology Code available for payment source | Admitting: Internal Medicine

## 2017-10-16 ENCOUNTER — Encounter: Payer: Self-pay | Admitting: Internal Medicine

## 2017-10-16 ENCOUNTER — Ambulatory Visit (INDEPENDENT_AMBULATORY_CARE_PROVIDER_SITE_OTHER): Payer: No Typology Code available for payment source | Admitting: Internal Medicine

## 2017-10-16 ENCOUNTER — Other Ambulatory Visit: Payer: No Typology Code available for payment source

## 2017-10-16 VITALS — BP 136/88 | HR 99 | Temp 98.0°F | Ht 68.0 in | Wt 269.0 lb

## 2017-10-16 DIAGNOSIS — Z23 Encounter for immunization: Secondary | ICD-10-CM | POA: Diagnosis not present

## 2017-10-16 DIAGNOSIS — N469 Male infertility, unspecified: Secondary | ICD-10-CM

## 2017-10-16 DIAGNOSIS — Z202 Contact with and (suspected) exposure to infections with a predominantly sexual mode of transmission: Secondary | ICD-10-CM

## 2017-10-16 DIAGNOSIS — E1165 Type 2 diabetes mellitus with hyperglycemia: Secondary | ICD-10-CM

## 2017-10-16 DIAGNOSIS — E291 Testicular hypofunction: Secondary | ICD-10-CM

## 2017-10-16 NOTE — Addendum Note (Signed)
Addended by: Roney MansGAY, SHIRRON on: 10/16/2017 03:05 PM   Modules accepted: Orders

## 2017-10-16 NOTE — Progress Notes (Signed)
Subjective:    Patient ID: Gregory Owens, male    DOB: Feb 06, 1970, 47 y.o.   MRN: [id number]  HPI  Here to f/u; overall doing ok,  Pt denies chest pain, increasing sob or doe, wheezing, orthopnea, PND, increased LE swelling, palpitations, dizziness or syncope.  Pt denies new neurological symptoms such as new headache, or facial or extremity weakness or numbness.  Pt denies polydipsia, polyuria, or low sugar episode.  Pt states overall good compliance with meds, mostly trying to follow appropriate diet, with wt overall stable,  but little exercise however.  Had low testosterone last visit, and he and wife have been trying for a baby for several months without pregnancy, and he is asking for semen analysis.  Also asking for STD testing, though states no symptoms, just very important to wife prior to pregnancy Past Medical History:  Diagnosis Date  . Abnormal EKG    NS ST-T EKG changes with negative Stress cardiolite 2003  . Anxiety   . Dehydration 2005   [place] , [place]  . Diabetes mellitus without complication ([place])   . Hyperglycemia   . Hyperlipidemia   . IBS (irritable bowel syndrome)   . OSA (obstructive sleep apnea)    resolved post surgery   Past Surgical History:  Procedure Laterality Date  . TONSILLECTOMY AND ADENOIDECTOMY      with above  . UVULOPALATOPHARYNGOPLASTY  2004   Post op bleeding complication;  Dr Janace Hoard  . WISDOM TOOTH EXTRACTION      reports that he has never smoked. He has never used smokeless tobacco. He reports that he does not drink alcohol or use drugs. family history includes Cirrhosis in his brother; Colon cancer in his paternal grandfather; Diabetes in his maternal grandmother and paternal grandmother; Heart attack (age of onset: 59) in his maternal grandmother and paternal grandmother; Hyperlipidemia in his father and mother; Hypertension in his father and mother; Other in his mother. No Known Allergies Current Outpatient Medications on File Prior  to Visit  Medication Sig Dispense Refill  . albuterol (PROVENTIL HFA;VENTOLIN HFA) 108 (90 Base) MCG/ACT inhaler Inhale 2 puffs into the lungs every 6 (six) hours as needed for wheezing or shortness of breath. 1 Inhaler 0  . aspirin 81 MG EC tablet TAKE 1 TABLET (81 MG TOTAL) BY MOUTH DAILY. 90 tablet 3  . BAYER CONTOUR NEXT TEST test strip USE TO CHECK BLOOD SUGAR 2 TIMES DAILY 100 each 1  . BAYER MICROLET LANCETS lancets Use to help check blood sugar daily Dx E11.9 100 each 3  . Blood Glucose Monitoring Suppl (BAYER CONTOUR NEXT MONITOR) w/Device KIT Use as directed to check blood sugars daily Dx e11.9 1 kit 0  . cetirizine (ZYRTEC) 10 MG tablet Take 10 mg by mouth daily.      . EFFEXOR XR 150 MG 24 hr capsule TAKE 1 CAPSULE (150 MG TOTAL) BY MOUTH DAILY. 90 capsule 2  . famotidine (PEPCID) 40 MG tablet Take 1 tablet (40 mg total) by mouth at bedtime. 30 tablet 3  . fluticasone (FLONASE) 50 MCG/ACT nasal spray Place 2 sprays into both nostrils daily. 16 g 6  . ketoconazole (NIZORAL) 2 % shampoo APPLY TO SCALP AND FACE AND SHAMPOO TWICE WEEKLY  6  . losartan (COZAAR) 25 MG tablet TAKE 1 TABLET (25 MG TOTAL) BY MOUTH DAILY. 90 tablet 2  . metFORMIN (GLUCOPHAGE-XR) 500 MG 24 hr tablet TAKE 3 TABLETS BY MOUTH EVERY DAY WITH BREAKFAST 270 tablet 2  . pantoprazole (  PROTONIX) 40 MG tablet Take 1 tablet (40 mg total) by mouth daily. 90 tablet 3  . Probiotic Product (ALIGN PO) Take by mouth.    . rosuvastatin (CRESTOR) 10 MG tablet Take 1 tablet (10 mg total) by mouth daily. 90 tablet 1  . testosterone cypionate (DEPO-TESTOSTERONE) 200 MG/ML injection Inject 1 mL (200 mg total) into the muscle every 14 (fourteen) days. 10 mL 1   No current facility-administered medications on file prior to visit.    Review of Systems  Constitutional: Negative for other unusual diaphoresis or sweats HENT: Negative for ear discharge or swelling Eyes: Negative for other worsening visual disturbances Respiratory:  Negative for stridor or other swelling  Gastrointestinal: Negative for worsening distension or other blood Genitourinary: Negative for retention or other urinary change Musculoskeletal: Negative for other MSK pain or swelling Skin: Negative for color change or other new lesions Neurological: Negative for worsening tremors and other numbness  Psychiatric/Behavioral: Negative for worsening agitation or other fatigue All other system neg per pt    Objective:   Physical Exam BP 136/88   Pulse 99   Temp 98 F (36.7 C) (Oral)   Ht '5\' 8"'  (1.727 m)   Wt 269 lb (122 kg)   SpO2 96%   BMI 40.90 kg/m  VS noted,  Constitutional: Pt appears in NAD HENT: Head: NCAT.  Right Ear: External ear normal.  Left Ear: External ear normal.  Eyes: . Pupils are equal, round, and reactive to light. Conjunctivae and EOM are normal Nose: without d/c or deformity Neck: Neck supple. Gross normal ROM Cardiovascular: Normal rate and regular rhythm.   Pulmonary/Chest: Effort normal and breath sounds without rales or wheezing.  Abd:  Soft, NT, ND, + BS, no organomegaly Neurological: Pt is alert. At baseline orientation, motor grossly intact Skin: Skin is warm. No rashes, other new lesions, no LE edema Psychiatric: Pt behavior is normal without agitation  No other exam findings Lab Results  Component Value Date   WBC 9.4 08/29/2017   HGB 13.7 08/29/2017   HCT 41.9 08/29/2017   PLT 304.0 08/29/2017   GLUCOSE 143 (H) 08/29/2017   CHOL 109 08/29/2017   TRIG 219.0 (H) 08/29/2017   HDL 32.60 (L) 08/29/2017   LDLDIRECT 53.0 08/29/2017   LDLCALC 55 03/08/2017   ALT 24 08/29/2017   AST 13 08/29/2017   NA 139 08/29/2017   K 3.4 (L) 08/29/2017   CL 103 08/29/2017   CREATININE 0.94 08/29/2017   BUN 14 08/29/2017   CO2 28 08/29/2017   TSH 1.99 03/08/2017   PSA 0.62 03/08/2017   HGBA1C 6.9 (H) 08/29/2017   MICROALBUR 1.0 03/08/2017         Assessment & Plan:

## 2017-10-16 NOTE — Assessment & Plan Note (Signed)
Improved recently with better diet and wt control, stable overall by history and exam, recent data reviewed with pt, and pt to continue medical treatment as before,  to f/u any worsening symptoms or concerns

## 2017-10-16 NOTE — Assessment & Plan Note (Signed)
Pt requests STD testing,  to f/u any worsening symptoms or concerns

## 2017-10-16 NOTE — Assessment & Plan Note (Signed)
To start the testosterone replacement after semen analysis

## 2017-10-16 NOTE — Patient Instructions (Addendum)
You had the flu shot today  Please return in 2 weeks for a Nurse Visit for the Prevnar 13 pneumonia shot, as well to be shown how to give the testosterone shots  Please continue all other medications as before, and refills have been done if requested.  Please have the pharmacy call with any other refills you may need.  Please continue your efforts at being more active, low cholesterol diet, and weight control.  Please keep your appointments with your specialists as you may have planned  Please go to the LAB in the Basement (turn left off the elevator) for the tests to be done today  You will be contacted by phone if any changes need to be made immediately.  Otherwise, you will receive a letter about your results with an explanation, but please check with MyChart first.  Please remember to sign up for MyChart if you have not done so, as this will be important to you in the future with finding out test results, communicating by private email, and scheduling acute appointments online when needed.

## 2017-10-17 LAB — RPR: RPR Ser Ql: NONREACTIVE

## 2017-10-17 LAB — HSV 2 ANTIBODY, IGG: HSV 2 Glycoprotein G Ab, IgG: <0.9 index

## 2017-10-17 LAB — HIV ANTIBODY (ROUTINE TESTING W REFLEX): HIV 1&2 Ab, 4th Generation: NONREACTIVE

## 2017-10-19 LAB — GC/CHLAMYDIA PROBE AMP
Chlamydia trachomatis, NAA: NEGATIVE
Neisseria gonorrhoeae by PCR: NEGATIVE

## 2017-10-22 ENCOUNTER — Other Ambulatory Visit: Payer: Self-pay | Admitting: Internal Medicine

## 2017-11-01 ENCOUNTER — Ambulatory Visit: Payer: No Typology Code available for payment source

## 2017-12-05 ENCOUNTER — Other Ambulatory Visit: Payer: Self-pay | Admitting: Internal Medicine

## 2018-02-03 ENCOUNTER — Other Ambulatory Visit: Payer: Self-pay | Admitting: Internal Medicine

## 2018-02-10 ENCOUNTER — Telehealth: Payer: Self-pay | Admitting: Internal Medicine

## 2018-02-10 MED ORDER — LOSARTAN POTASSIUM 25 MG PO TABS: 25.0000 mg | ORAL_TABLET | Freq: Every day | ORAL | 2 refills | Status: DC

## 2018-02-10 NOTE — Telephone Encounter (Signed)
Patient is requesting refill on los[facility] [place].

## 2018-02-24 ENCOUNTER — Other Ambulatory Visit: Payer: Self-pay | Admitting: Internal Medicine

## 2018-03-03 ENCOUNTER — Ambulatory Visit: Payer: Self-pay | Admitting: Internal Medicine

## 2018-03-03 ENCOUNTER — Encounter: Payer: Self-pay | Admitting: Internal Medicine

## 2018-03-03 VITALS — BP 120/78 | HR 90 | Temp 98.8°F | Wt 268.1 lb

## 2018-03-03 DIAGNOSIS — J22 Unspecified acute lower respiratory infection: Secondary | ICD-10-CM

## 2018-03-03 DIAGNOSIS — E1165 Type 2 diabetes mellitus with hyperglycemia: Secondary | ICD-10-CM | POA: Diagnosis not present

## 2018-03-03 DIAGNOSIS — R6889 Other general symptoms and signs: Secondary | ICD-10-CM | POA: Diagnosis not present

## 2018-03-03 LAB — POCT INFLUENZA A/B
Influenza A, POC: NEGATIVE
Influenza B, POC: NEGATIVE

## 2018-03-03 LAB — GLUCOSE, POCT (MANUAL RESULT ENTRY): POC Glucose: 233 mg/dl — AB (ref 70–99)

## 2018-03-03 NOTE — Patient Instructions (Addendum)
Your chest exam is good  I don't hear pneumonia sounds today  This is a viral respiratory infection   caution with cold meds  . Can cause side effects.    tylenol plain , Dm plain ,mucinex plain     Avoid decongestants   You need to be hydrated  ( not sugar  Or sweet drinks)   Your blood sugar is up .   If getting fever  Late in illness  Of not improving after a week to 10 days of illness then plan fu care  At this time I dont see bacterial infection   But  Close fu with your  Health team if worse or not getting better .   Check your blood sugars.

## 2018-03-03 NOTE — Progress Notes (Signed)
Chief Complaint  Patient presents with  . Cough    X 3 Days pt states he has felt feverish, has diarrhea, and cough. Pt has productive cough especially in morning and its a "yellowish/green color" Pt has been taking tyenlol and alkaseltzer. Pt states he feels sweaty and clamy.Pt wife has been sick with lower resp     HPI: Gregory Owens 48 y.o. come in for  sda PCP NA today   Here with wife   Last week  Wife dx  UC at [facility] and having lower resp.  Sx and cough     And  End of last week feverish and cough  Loose stool  Breathing ok  No wheezing .   Fatigue Always has  An elevated heart rate   .   . fels clammy but no cp sob   nop vomiting loose stools  hasnt checked bg for a while     ROS: See pertinent positives and negatives per HPI. No vomiting rash  Feels clammy no sob   Past Medical History:  Diagnosis Date  . Abnormal EKG    NS ST-T EKG changes with negative Stress cardiolite 2003  . Anxiety   . Dehydration 2005   [place] , [place]  . Diabetes mellitus without complication ([place])   . Hyperglycemia   . Hyperlipidemia   . IBS (irritable bowel syndrome)   . OSA (obstructive sleep apnea)    resolved post surgery    Family History  Problem Relation Age of Onset  . Hyperlipidemia Mother   . Hypertension Mother   . Other Mother        Inflammatory Occipital Lymphadenitis  . Hypertension Father   . Hyperlipidemia Father   . Diabetes Maternal Grandmother   . Heart attack Maternal Grandmother 60  . Diabetes Paternal Grandmother   . Heart attack Paternal Grandmother 44  . Cirrhosis Brother        autoimmune hepatitis (twin)  . Colon cancer Paternal Grandfather     Social History   Socioeconomic History  . Marital status: Single    Spouse name: Not on file  . Number of children: Not on file  . Years of education: Not on file  . Highest education level: Not on file  Occupational History  . 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 Financial resource strain: Not on file  . Food insecurity:    Worry: Not on file    Inability: Not on file  . Transportation needs:    Medical: Not on file    Non-medical: Not on file  Tobacco Use  . Smoking status: Never Smoker  . Smokeless tobacco: Never Used  Substance and Sexual Activity  . Alcohol use: No  . Drug use: No  . Sexual activity: Not on file  Lifestyle  . Physical activity:    Days per week: Not on file    Minutes per session: Not on file  . Stress: Not on file  Relationships  . Social connections:    Talks on phone: Not on file    Gets together: Not on file    Attends religious service: Not on file    Active member of club or organization: Not on file    Attends meetings of clubs or organizations: Not on file    Relationship status: Not on file  Other Topics Concern  . Not on file  Social History Narrative   Regular exercise:  no             Outpatient Medications Prior to Visit  Medication Sig Dispense Refill  . albuterol (PROVENTIL HFA;VENTOLIN HFA) 108 (90 Base) MCG/ACT inhaler Inhale 2 puffs into the lungs every 6 (six) hours as needed for wheezing or shortness of breath. 1 Inhaler 0  . aspirin 81 MG EC tablet TAKE 1 TABLET (81 MG TOTAL) BY MOUTH DAILY. 90 tablet 3  . BAYER CONTOUR NEXT TEST test strip USE TO CHECK BLOOD SUGAR 2 TIMES DAILY 100 each 1  . BAYER MICROLET LANCETS lancets Use to help check blood sugar daily Dx E11.9 100 each 3  . Blood Glucose Monitoring Suppl (BAYER CONTOUR NEXT MONITOR) w/Device KIT Use as directed to check blood sugars daily Dx e11.9 1 kit 0  . cetirizine (ZYRTEC) 10 MG tablet Take 10 mg by mouth daily.      . EFFEXOR XR 150 MG 24 hr capsule TAKE 1 CAPSULE BY MOUTH EVERY DAY 90 capsule 1  . famotidine (PEPCID) 40 MG tablet TAKE 1 TABLET BY MOUTH ONCE DAILY AT BEDTIME 30 tablet 5  . fluticasone (FLONASE) 50 MCG/ACT nasal spray Place 2 sprays into both nostrils daily. 16 g 6  . ketoconazole  (NIZORAL) 2 % shampoo APPLY TO SCALP AND FACE AND SHAMPOO TWICE WEEKLY  6  . losartan (COZAAR) 25 MG tablet Take 1 tablet (25 mg total) by mouth daily. 90 tablet 2  . metFORMIN (GLUCOPHAGE-XR) 500 MG 24 hr tablet TAKE 3 TABLETS BY MOUTH EVERY DAY WITH BREAKFAST 270 tablet 2  . pantoprazole (PROTONIX) 40 MG tablet Take 1 tablet (40 mg total) by mouth daily. 90 tablet 3  . Probiotic Product (ALIGN PO) Take by mouth.    . rosuvastatin (CRESTOR) 10 MG tablet Take 1 tablet (10 mg total) by mouth daily. 90 tablet 1  . testosterone cypionate (DEPO-TESTOSTERONE) 200 MG/ML injection Inject 1 mL (200 mg total) into the muscle every 14 (fourteen) days. 10 mL 1   No facility-administered medications prior to visit.      EXAM:  BP 120/78 (BP Location: Right Arm, Patient Position: Sitting, Cuff Size: Large)   Pulse 90   Temp 98.8 F (37.1 C) (Oral)   Wt 268 lb 1.6 oz (121.6 kg)   SpO2 99%   BMI 40.76 kg/m   Body mass index is 40.76 kg/m.  GENERAL: vitals reviewed and listed above, alert, oriented, appears well hydrated and in no acute distress HEENT: atraumatic, conjunctiva  clear, no obvious abnormalities on inspection of external nose and ears tmx nOP : no lesion edema or exudate  NECK: no obvious masses on inspection palpation  LUNGS: clear to auscultation bilaterally, no wheezes, rales or rhonchi, good air movement CV: HRRR,HR 90  No g or m  no clubbing cyanosis or  peripheral edema nl cap refill Abdomen:  Sof,t normal bowel sounds without hepatosplenomegaly, no guarding rebound or masses no CVA tenderness  MS: moves all extremities without noticeable focal  Abnormality Skin onn acute rash nl cap refill  PSYCH: pleasant and cooperative, no obvious depression or anxiety  BP Readings from Last 3 Encounters:  03/03/18 120/78  10/16/17 136/88  09/18/17 126/76   cbg was 233  ASSESSMENT AND PLAN:  Discussed the following assessment and plan:  Acute respiratory infection  Flu-like  symptoms - Plan: POC Influenza A/B  Uncontrolled type 2 diabetes mellitus with hyperglycemia (HCC) - bg over 200 post prandial  - Plan: POC Glucose (CBG) Flu screen neg  Suspect  Viral illness  Hr elevated : consider se of medications also  close observation advised  Hydration    At this time risk of antibiotic  More than any otential benefit    advise check bg bid for 1- w2 weeks and make fu visit with PCP    ( to hole metformin if severely dehydrated)   -Patient advised to return or notify health care team  if  new concerns arise.  Patient Instructions  Your chest exam is good  I don't hear pneumonia sounds today  This is a viral respiratory infection   caution with cold meds  . Can cause side effects.    tylenol plain , Dm plain ,mucinex plain     Avoid decongestants   You need to be hydrated  ( not sugar  Or sweet drinks)   Your blood sugar is up .   If getting fever  Late in illness  Of not improving after a week to 10 days of illness then plan fu care  At this time I dont see bacterial infection   But  Close fu with your  Health team if worse or not getting better .   Check your blood sugars.     Standley Brooking. Panosh M.D.

## 2018-03-20 ENCOUNTER — Telehealth: Payer: Self-pay | Admitting: Internal Medicine

## 2018-03-20 MED ORDER — GLUCOSE BLOOD VI STRP: ORAL_STRIP | 12 refills | Status: DC

## 2018-03-20 NOTE — Telephone Encounter (Signed)
Copied from CRM [id number]. Topic: Quick Communication - Rx Refill/Question >> Mar 20, 2018  1:34 PM Arlyss Gandy, NT wrote: Medication: BAYER CONTOUR NEXT TEST test strip   Has the patient contacted their pharmacy? Yes.   (Agent: If no, request that the patient contact the pharmacy for the refill.) (Agent: If yes, when and what did the pharmacy advise?)  Preferred Pharmacy (with phone number or street name): [facility] [address] [place] - [address] [phone] (Phone) [phone] (Fax)    Agent: Please be advised that RX refills may take up to 3 business days. We ask that you follow-up with your pharmacy.

## 2018-03-20 NOTE — Telephone Encounter (Signed)
Done erx 

## 2018-03-20 NOTE — Addendum Note (Signed)
Addended by: Corwin Levins on: 03/20/2018 03:01 PM   Modules accepted: Orders

## 2018-03-20 NOTE — Telephone Encounter (Signed)
Tried to pend request- got this message: The following medication records are no longer available for ordering. Place a new order with a different medication record. BAYER CONTOUR NEXT TEST test strip  Patient may need new Rx for this

## 2018-03-20 NOTE — Telephone Encounter (Signed)
Please advise. I received the same message trying to reorder them.

## 2018-03-21 ENCOUNTER — Encounter: Payer: Self-pay | Admitting: Internal Medicine

## 2018-03-21 MED ORDER — GLUCOSE BLOOD VI STRP: ORAL_STRIP | 2 refills | Status: DC

## 2018-03-27 ENCOUNTER — Other Ambulatory Visit: Payer: Self-pay | Admitting: Internal Medicine

## 2018-03-28 ENCOUNTER — Ambulatory Visit (INDEPENDENT_AMBULATORY_CARE_PROVIDER_SITE_OTHER): Payer: BLUE CROSS/BLUE SHIELD | Admitting: Internal Medicine

## 2018-03-28 ENCOUNTER — Other Ambulatory Visit (INDEPENDENT_AMBULATORY_CARE_PROVIDER_SITE_OTHER): Payer: BLUE CROSS/BLUE SHIELD

## 2018-03-28 ENCOUNTER — Encounter: Payer: Self-pay | Admitting: Internal Medicine

## 2018-03-28 VITALS — BP 132/86 | HR 103 | Temp 97.8°F | Ht 68.0 in | Wt 273.0 lb

## 2018-03-28 DIAGNOSIS — Z0000 Encounter for general adult medical examination without abnormal findings: Secondary | ICD-10-CM

## 2018-03-28 DIAGNOSIS — Z125 Encounter for screening for malignant neoplasm of prostate: Secondary | ICD-10-CM

## 2018-03-28 DIAGNOSIS — E785 Hyperlipidemia, unspecified: Secondary | ICD-10-CM | POA: Diagnosis not present

## 2018-03-28 DIAGNOSIS — E1165 Type 2 diabetes mellitus with hyperglycemia: Secondary | ICD-10-CM | POA: Diagnosis not present

## 2018-03-28 LAB — HEPATIC FUNCTION PANEL
ALT: 13 U/L (ref 0–53)
AST: 11 U/L (ref 0–37)
Albumin: 4.2 g/dL (ref 3.5–5.2)
Alkaline Phosphatase: 66 U/L (ref 39–117)
Bilirubin, Direct: 0.1 mg/dL (ref 0.0–0.3)
Total Bilirubin: 0.2 mg/dL (ref 0.2–1.2)
Total Protein: 7 g/dL (ref 6.0–8.3)

## 2018-03-28 LAB — LIPID PANEL
Cholesterol: 108 mg/dL (ref 0–200)
HDL: 35.6 mg/dL — ABNORMAL LOW (ref >39.00)
LDL Cholesterol: 46 mg/dL (ref 0–99)
NonHDL: 72.38
Total CHOL/HDL Ratio: 3
Triglycerides: 132 mg/dL (ref 0.0–149.0)
VLDL: 26.4 mg/dL (ref 0.0–40.0)

## 2018-03-28 LAB — BASIC METABOLIC PANEL
BUN: 17 mg/dL (ref 6–23)
CO2: 30 mEq/L (ref 19–32)
Calcium: 8.9 mg/dL (ref 8.4–10.5)
Chloride: 104 mEq/L (ref 96–112)
Creatinine, Ser: 0.86 mg/dL (ref 0.40–1.50)
GFR: 114.47 mL/min (ref >60.00)
Glucose, Bld: 97 mg/dL (ref 70–99)
Potassium: 4 mEq/L (ref 3.5–5.1)
Sodium: 143 mEq/L (ref 135–145)

## 2018-03-28 LAB — URINALYSIS, ROUTINE W REFLEX MICROSCOPIC
Bilirubin Urine: NEGATIVE
Hgb urine dipstick: NEGATIVE
Leukocytes,Ua: NEGATIVE
Nitrite: NEGATIVE
RBC / HPF: NONE SEEN (ref 0–?)
Specific Gravity, Urine: 1.025 (ref 1.000–1.030)
Total Protein, Urine: NEGATIVE
Urine Glucose: NEGATIVE
Urobilinogen, UA: 1 (ref 0.0–1.0)
pH: 7 (ref 5.0–8.0)

## 2018-03-28 LAB — CBC WITH DIFFERENTIAL/PLATELET
Basophils Absolute: 0.1 10*3/uL (ref 0.0–0.1)
Basophils Relative: 1 % (ref 0.0–3.0)
Eosinophils Absolute: 0.2 10*3/uL (ref 0.0–0.7)
Eosinophils Relative: 2 % (ref 0.0–5.0)
HCT: 41.9 % (ref 39.0–52.0)
Hemoglobin: 13.8 g/dL (ref 13.0–17.0)
Lymphocytes Relative: 24.7 % (ref 12.0–46.0)
Lymphs Abs: 2.6 10*3/uL (ref 0.7–4.0)
MCHC: 32.9 g/dL (ref 30.0–36.0)
MCV: 92 fl (ref 78.0–100.0)
Monocytes Absolute: 0.8 10*3/uL (ref 0.1–1.0)
Monocytes Relative: 7.9 % (ref 3.0–12.0)
Neutro Abs: 6.8 10*3/uL (ref 1.4–7.7)
Neutrophils Relative %: 64.4 % (ref 43.0–77.0)
Platelets: 304 10*3/uL (ref 150.0–400.0)
RBC: 4.56 Mil/uL (ref 4.22–5.81)
RDW: 12.9 % (ref 11.5–15.5)
WBC: 10.5 10*3/uL (ref 4.0–10.5)

## 2018-03-28 LAB — TSH: TSH: 2.22 u[IU]/mL (ref 0.35–4.50)

## 2018-03-28 LAB — MICROALBUMIN / CREATININE URINE RATIO
Creatinine,U: 207 mg/dL
Microalb Creat Ratio: 0.4 mg/g (ref 0.0–30.0)
Microalb, Ur: 0.9 mg/dL (ref 0.0–1.9)

## 2018-03-28 LAB — HEMOGLOBIN A1C: Hgb A1c MFr Bld: 7.2 % — ABNORMAL HIGH (ref 4.6–6.5)

## 2018-03-28 LAB — PSA: PSA: 0.63 ng/mL (ref 0.10–4.00)

## 2018-03-28 NOTE — Patient Instructions (Signed)
You will be contacted regarding the referral for: Cardiac CT scoring test (for calcium)  Please continue all other medications as before, and refills have been done if requested.  Please have the pharmacy call with any other refills you may need.  Please continue your efforts at being more active, low cholesterol diet, and weight control.  You are otherwise up to date with prevention measures today.  Please keep your appointments with your specialists as you may have planned  Please go to the LAB in the Basement (turn left off the elevator) for the tests to be done today  You will be contacted by phone if any changes need to be made immediately.  Otherwise, you will receive a letter about your results with an explanation, but please check with MyChart first.  Please remember to sign up for MyChart if you have not done so, as this will be important to you in the future with finding out test results, communicating by private email, and scheduling acute appointments online when needed.  Please return in 6 months, or sooner if needed, with Lab testing done 3-5 days before

## 2018-03-28 NOTE — Progress Notes (Signed)
Subjective:    Patient ID: Gregory Owens, male    DOB: 1971-01-21, 48 y.o.   MRN: [id number]  HPI  Here for wellness and f/u;  Overall doing ok;  Pt denies Chest pain, worsening SOB, DOE, wheezing, orthopnea, PND, worsening LE edema, palpitations, dizziness or syncope.  Pt denies neurological change such as new headache, facial or extremity weakness.  Pt denies polydipsia, polyuria, or low sugar symptoms. Pt states overall good compliance with treatment and medications, good tolerability, and has been trying to follow appropriate diet.  Pt denies worsening depressive symptoms, suicidal ideation or panic. No fever, night sweats, wt loss, loss of appetite, or other constitutional symptoms.  Pt states good ability with ADL's, has low fall risk, home safety reviewed and adequate, no other significant changes in hearing or vision, and only occasionally active with exercise. Plans to call for eye doctor soon.  Plans to start the testosterone soon.    No new complaints.. Past Medical History:  Diagnosis Date  . Abnormal EKG    NS ST-T EKG changes with negative Stress cardiolite 2003  . Anxiety   . Dehydration 2005   [place] , [place]  . Diabetes mellitus without complication ([place])   . Hyperglycemia   . Hyperlipidemia   . IBS (irritable bowel syndrome)   . OSA (obstructive sleep apnea)    resolved post surgery   Past Surgical History:  Procedure Laterality Date  . TONSILLECTOMY AND ADENOIDECTOMY      with above  . UVULOPALATOPHARYNGOPLASTY  2004   Post op bleeding complication;  Dr Janace Hoard  . WISDOM TOOTH EXTRACTION      reports that he has never smoked. He has never used smokeless tobacco. He reports that he does not drink alcohol or use drugs. family history includes Cirrhosis in his brother; Colon cancer in his paternal grandfather; Diabetes in his maternal grandmother and paternal grandmother; Heart attack (age of onset: 85) in his maternal grandmother and paternal grandmother;  Hyperlipidemia in his father and mother; Hypertension in his father and mother; Other in his mother. No Known Allergies Current Outpatient Medications on File Prior to Visit  Medication Sig Dispense Refill  . aspirin 81 MG EC tablet TAKE 1 TABLET (81 MG TOTAL) BY MOUTH DAILY. 90 tablet 3  . BAYER MICROLET LANCETS lancets Use to help check blood sugar daily Dx E11.9 100 each 3  . Blood Glucose Monitoring Suppl (BAYER CONTOUR NEXT MONITOR) w/Device KIT Use as directed to check blood sugars daily Dx e11.9 1 kit 0  . cetirizine (ZYRTEC) 10 MG tablet Take 10 mg by mouth daily.      . EFFEXOR XR 150 MG 24 hr capsule TAKE 1 CAPSULE BY MOUTH EVERY DAY 90 capsule 1  . famotidine (PEPCID) 40 MG tablet TAKE 1 TABLET BY MOUTH ONCE DAILY AT BEDTIME 30 tablet 0  . glucose blood test strip Use as instructed twice a day E 11.9 Contour Next Strips 100 each 2  . ketoconazole (NIZORAL) 2 % shampoo APPLY TO SCALP AND FACE AND SHAMPOO TWICE WEEKLY  6  . losartan (COZAAR) 25 MG tablet Take 1 tablet (25 mg total) by mouth daily. 90 tablet 2  . metFORMIN (GLUCOPHAGE-XR) 500 MG 24 hr tablet TAKE 3 TABLETS BY MOUTH EVERY DAY WITH BREAKFAST 270 tablet 2  . pantoprazole (PROTONIX) 40 MG tablet Take 1 tablet (40 mg total) by mouth daily. 90 tablet 3  . Probiotic Product (ALIGN PO) Take by mouth.    . rosuvastatin (CRESTOR)  10 MG tablet Take 1 tablet (10 mg total) by mouth daily. 90 tablet 1  . testosterone cypionate (DEPO-TESTOSTERONE) 200 MG/ML injection Inject 1 mL (200 mg total) into the muscle every 14 (fourteen) days. 10 mL 1   No current facility-administered medications on file prior to visit.    Review of Systems Constitutional: Negative for other unusual diaphoresis, sweats, appetite or weight changes HENT: Negative for other worsening hearing loss, ear pain, facial swelling, mouth sores or neck stiffness.   Eyes: Negative for other worsening pain, redness or other visual disturbance.  Respiratory: Negative  for other stridor or swelling Cardiovascular: Negative for other palpitations or other chest pain  Gastrointestinal: Negative for worsening diarrhea or loose stools, blood in stool, distention or other pain Genitourinary: Negative for hematuria, flank pain or other change in urine volume.  Musculoskeletal: Negative for myalgias or other joint swelling.  Skin: Negative for other color change, or other wound or worsening drainage.  Neurological: Negative for other syncope or numbness. Hematological: Negative for other adenopathy or swelling Psychiatric/Behavioral: Negative for hallucinations, other worsening agitation, SI, self-injury, or new decreased concentration All other system neg per pt    Objective:   Physical Exam BP 132/86   Pulse (!) 103   Temp 97.8 F (36.6 C) (Oral)   Ht _0  (1.727 m)   Wt 273 lb (123.8 kg)   SpO2 96%   BMI 41.51 kg/m  VS noted,  Constitutional: Pt is oriented to person, place, and time. Appears well-developed and well-nourished, in no significant distress and comfortable Head: Normocephalic and atraumatic  Eyes: Conjunctivae and EOM are normal. Pupils are equal, round, and reactive to light Right Ear: External ear normal without discharge Left Ear: External ear normal without discharge Nose: Nose without discharge or deformity Mouth/Throat: Oropharynx is without other ulcerations and moist  Neck: Normal range of motion. Neck supple. No JVD present. No tracheal deviation present or significant neck LA or mass Cardiovascular: Normal rate, regular rhythm, normal heart sounds and intact distal pulses.   Pulmonary/Chest: WOB normal and breath sounds without rales or wheezing  Abdominal: Soft. Bowel sounds are normal. NT. No HSM  Musculoskeletal: Normal range of motion. Exhibits no edema Lymphadenopathy: Has no other cervical adenopathy.  Neurological: Pt is alert and oriented to person, place, and time. Pt has normal reflexes. No cranial nerve deficit.  Motor grossly intact, Gait intact Skin: Skin is warm and dry. No rash noted or new ulcerations Psychiatric:  Has normal mood and affect. Behavior is normal without agitation No other exam findings Lab Results  Component Value Date   WBC 10.5 03/28/2018   HGB 13.8 03/28/2018   HCT 41.9 03/28/2018   PLT 304.0 03/28/2018   GLUCOSE 97 03/28/2018   CHOL 108 03/28/2018   TRIG 132.0 03/28/2018   HDL 35.60 (L) 03/28/2018   LDLDIRECT 53.0 08/29/2017   LDLCALC 46 03/28/2018   ALT 13 03/28/2018   AST 11 03/28/2018   NA 143 03/28/2018   K 4.0 03/28/2018   CL 104 03/28/2018   CREATININE 0.86 03/28/2018   BUN 17 03/28/2018   CO2 30 03/28/2018   TSH 2.22 03/28/2018   PSA 0.63 03/28/2018   HGBA1C 7.2 (H) 03/28/2018   MICROALBUR 0.9 03/28/2018       Assessment & Plan:

## 2018-03-28 NOTE — Assessment & Plan Note (Signed)

## 2018-03-28 NOTE — Assessment & Plan Note (Signed)
stable overall by history and exam, recent data reviewed with pt, and pt to continue medical treatment as before,  to f/u any worsening symptoms or concerns  

## 2018-04-12 ENCOUNTER — Other Ambulatory Visit: Payer: Self-pay | Admitting: Internal Medicine

## 2018-04-18 ENCOUNTER — Other Ambulatory Visit: Payer: Self-pay

## 2018-04-18 ENCOUNTER — Ambulatory Visit (INDEPENDENT_AMBULATORY_CARE_PROVIDER_SITE_OTHER): Payer: Self-pay

## 2018-04-18 ENCOUNTER — Telehealth: Payer: Self-pay

## 2018-04-18 DIAGNOSIS — E291 Testicular hypofunction: Secondary | ICD-10-CM

## 2018-04-18 MED ORDER — TESTOSTERONE CYPIONATE 200 MG/ML IM SOLN
200.0000 mg | Freq: Once | INTRAMUSCULAR | Status: AC
  Administered 2018-04-18: 200 mg via INTRAMUSCULAR

## 2018-04-18 NOTE — Progress Notes (Addendum)
Routing to dr john,----patient has just now started nurse visits for testoterone injections, got a late start---however, the previous refill that was sent in with his original rx has expired, can you please send in more refills?---and also, please advise when patient needs to come back to have follow up office visit with you, send mychart message to patient to advise, thanks  Refill done  Pt has already been asked to return to clinic about 6 mo from last visit Mar 28 2018  Medical screening examination/treatment/procedure(s) were performed by non-physician practitioner and as supervising physician I was immediately available for consultation/collaboration. I agree with above. Oliver Barre, MD

## 2018-04-18 NOTE — Telephone Encounter (Signed)
Routing to dr Jonny Ruiz, patient is just now starting testosterone injections, got late start---1st injection was today, however refills previously sent in on his original rx have now expired---please send in more refills for 200mg /ml testosterone medication, also can you send mychart message to patient to advise when you want to see him for follow up office visit and he will make that appt., thanks

## 2018-04-19 ENCOUNTER — Other Ambulatory Visit: Payer: Self-pay | Admitting: Internal Medicine

## 2018-04-19 MED ORDER — TESTOSTERONE CYPIONATE 200 MG/ML IM SOLN: 200.0000 mg | INTRAMUSCULAR | 1 refills | Status: DC

## 2018-04-19 NOTE — Telephone Encounter (Signed)
See other note

## 2018-04-29 ENCOUNTER — Other Ambulatory Visit: Payer: BLUE CROSS/BLUE SHIELD

## 2018-04-29 ENCOUNTER — Encounter: Payer: Self-pay | Admitting: Internal Medicine

## 2018-05-18 ENCOUNTER — Encounter: Payer: Self-pay | Admitting: Internal Medicine

## 2018-05-19 ENCOUNTER — Other Ambulatory Visit: Payer: Self-pay | Admitting: Internal Medicine

## 2018-05-19 ENCOUNTER — Telehealth: Payer: Self-pay | Admitting: Internal Medicine

## 2018-05-19 MED ORDER — METFORMIN HCL ER 500 MG PO TB24: ORAL_TABLET | ORAL | 3 refills | Status: DC

## 2018-05-19 MED ORDER — FAMOTIDINE 40 MG PO TABS: 40.0000 mg | ORAL_TABLET | Freq: Every day | ORAL | 3 refills | Status: DC

## 2018-05-19 MED ORDER — ROSUVASTATIN CALCIUM 10 MG PO TABS: 10.0000 mg | ORAL_TABLET | Freq: Every day | ORAL | 3 refills | Status: DC

## 2018-05-19 NOTE — Telephone Encounter (Signed)
All 3 done erx 

## 2018-05-19 NOTE — Telephone Encounter (Signed)
Copied from CRM [id number]. Topic: Quick Communication - Rx Refill/Question >> May 19, 2018  3:49 PM Jens Som A wrote: Medication: Patient is calling states that the pharmacy does not have his medications. Can these be resent? famotidine (PEPCID) 40 MG tablet [[id number]] , metFORMIN (GLUCOPHAGE-XR) 500 MG 24 hr tablet [[id number]] , rosuvastatin (CRESTOR) 10 MG tablet [[id number]]    Has the patient contacted their pharmacy? Yes  (Agent: If no, request that the patient contact the pharmacy for the refill.) (Agent: If yes, when and what did the pharmacy advise?)  Preferred Pharmacy (with phone number or street name):   Agent: Please be advised that RX refills may take up to 3 business days. We ask that you follow-up with your pharmacy.

## 2018-06-20 ENCOUNTER — Telehealth: Payer: Self-pay | Admitting: *Deleted

## 2018-06-20 NOTE — Telephone Encounter (Signed)

## 2018-06-24 ENCOUNTER — Ambulatory Visit (INDEPENDENT_AMBULATORY_CARE_PROVIDER_SITE_OTHER)
Admission: RE | Admit: 2018-06-24 | Discharge: 2018-06-24 | Disposition: A | Discharge status: Home / Self Care | Payer: Self-pay | Source: Ambulatory Visit | Attending: Internal Medicine | Admitting: Internal Medicine

## 2018-06-24 ENCOUNTER — Other Ambulatory Visit: Payer: Self-pay

## 2018-06-24 DIAGNOSIS — E1165 Type 2 diabetes mellitus with hyperglycemia: Secondary | ICD-10-CM

## 2018-06-24 DIAGNOSIS — E785 Hyperlipidemia, unspecified: Secondary | ICD-10-CM

## 2018-07-08 DIAGNOSIS — F4321 Adjustment disorder with depressed mood: Secondary | ICD-10-CM | POA: Diagnosis not present

## 2018-07-18 DIAGNOSIS — F4321 Adjustment disorder with depressed mood: Secondary | ICD-10-CM | POA: Diagnosis not present

## 2018-09-26 ENCOUNTER — Ambulatory Visit: Payer: BLUE CROSS/BLUE SHIELD | Admitting: Internal Medicine

## 2018-09-29 ENCOUNTER — Ambulatory Visit: Payer: BLUE CROSS/BLUE SHIELD | Admitting: Internal Medicine

## 2018-09-29 DIAGNOSIS — Z0289 Encounter for other administrative examinations: Secondary | ICD-10-CM

## 2018-09-30 ENCOUNTER — Ambulatory Visit: Payer: BLUE CROSS/BLUE SHIELD | Admitting: Internal Medicine

## 2018-10-10 DIAGNOSIS — F4321 Adjustment disorder with depressed mood: Secondary | ICD-10-CM | POA: Diagnosis not present

## 2018-10-11 ENCOUNTER — Other Ambulatory Visit: Payer: Self-pay | Admitting: Internal Medicine

## 2018-11-12 ENCOUNTER — Encounter: Payer: Self-pay | Admitting: Internal Medicine

## 2018-11-12 ENCOUNTER — Other Ambulatory Visit: Payer: Self-pay | Admitting: Internal Medicine

## 2018-11-12 ENCOUNTER — Other Ambulatory Visit (INDEPENDENT_AMBULATORY_CARE_PROVIDER_SITE_OTHER): Payer: BC Managed Care – PPO

## 2018-11-12 ENCOUNTER — Ambulatory Visit (INDEPENDENT_AMBULATORY_CARE_PROVIDER_SITE_OTHER): Payer: BC Managed Care – PPO | Admitting: Internal Medicine

## 2018-11-12 ENCOUNTER — Other Ambulatory Visit: Payer: Self-pay

## 2018-11-12 VITALS — BP 118/76 | HR 96 | Temp 98.0°F | Ht 68.0 in | Wt 269.0 lb

## 2018-11-12 DIAGNOSIS — E291 Testicular hypofunction: Secondary | ICD-10-CM

## 2018-11-12 DIAGNOSIS — R3915 Urgency of urination: Secondary | ICD-10-CM | POA: Diagnosis not present

## 2018-11-12 DIAGNOSIS — E611 Iron deficiency: Secondary | ICD-10-CM

## 2018-11-12 DIAGNOSIS — E1165 Type 2 diabetes mellitus with hyperglycemia: Secondary | ICD-10-CM | POA: Diagnosis not present

## 2018-11-12 DIAGNOSIS — E538 Deficiency of other specified B group vitamins: Secondary | ICD-10-CM | POA: Diagnosis not present

## 2018-11-12 DIAGNOSIS — K219 Gastro-esophageal reflux disease without esophagitis: Secondary | ICD-10-CM | POA: Diagnosis not present

## 2018-11-12 DIAGNOSIS — F909 Attention-deficit hyperactivity disorder, unspecified type: Secondary | ICD-10-CM | POA: Diagnosis not present

## 2018-11-12 DIAGNOSIS — E559 Vitamin D deficiency, unspecified: Secondary | ICD-10-CM | POA: Diagnosis not present

## 2018-11-12 DIAGNOSIS — E785 Hyperlipidemia, unspecified: Secondary | ICD-10-CM

## 2018-11-12 DIAGNOSIS — Z23 Encounter for immunization: Secondary | ICD-10-CM | POA: Diagnosis not present

## 2018-11-12 LAB — URINALYSIS, ROUTINE W REFLEX MICROSCOPIC
Hgb urine dipstick: NEGATIVE
Ketones, ur: NEGATIVE
Leukocytes,Ua: NEGATIVE
Nitrite: NEGATIVE
RBC / HPF: NONE SEEN (ref 0–?)
Specific Gravity, Urine: >=1.03 — AB (ref 1.000–1.030)
Total Protein, Urine: NEGATIVE
Urine Glucose: NEGATIVE
Urobilinogen, UA: 0.2 (ref 0.0–1.0)
pH: 5.5 (ref 5.0–8.0)

## 2018-11-12 LAB — LIPID PANEL
Cholesterol: 111 mg/dL (ref 0–200)
HDL: 35 mg/dL — ABNORMAL LOW (ref >39.00)
LDL Cholesterol: 51 mg/dL (ref 0–99)
NonHDL: 75.95
Total CHOL/HDL Ratio: 3
Triglycerides: 126 mg/dL (ref 0.0–149.0)
VLDL: 25.2 mg/dL (ref 0.0–40.0)

## 2018-11-12 LAB — IBC PANEL
Iron: 55 ug/dL (ref 42–165)
Saturation Ratios: 16.4 % — ABNORMAL LOW (ref 20.0–50.0)
Transferrin: 240 mg/dL (ref 212.0–360.0)

## 2018-11-12 LAB — HEPATIC FUNCTION PANEL
ALT: 14 U/L (ref 0–53)
AST: 15 U/L (ref 0–37)
Albumin: 4.1 g/dL (ref 3.5–5.2)
Alkaline Phosphatase: 66 U/L (ref 39–117)
Bilirubin, Direct: 0.1 mg/dL (ref 0.0–0.3)
Total Bilirubin: 0.4 mg/dL (ref 0.2–1.2)
Total Protein: 7.6 g/dL (ref 6.0–8.3)

## 2018-11-12 LAB — BASIC METABOLIC PANEL
BUN: 12 mg/dL (ref 6–23)
CO2: 27 mEq/L (ref 19–32)
Calcium: 9.4 mg/dL (ref 8.4–10.5)
Chloride: 102 mEq/L (ref 96–112)
Creatinine, Ser: 0.95 mg/dL (ref 0.40–1.50)
GFR: 101.79 mL/min (ref >60.00)
Glucose, Bld: 143 mg/dL — ABNORMAL HIGH (ref 70–99)
Potassium: 3.9 mEq/L (ref 3.5–5.1)
Sodium: 139 mEq/L (ref 135–145)

## 2018-11-12 LAB — VITAMIN D 25 HYDROXY (VIT D DEFICIENCY, FRACTURES): VITD: 14.11 ng/mL — ABNORMAL LOW (ref 30.00–100.00)

## 2018-11-12 LAB — HEMOGLOBIN A1C: Hgb A1c MFr Bld: 8.2 % — ABNORMAL HIGH (ref 4.6–6.5)

## 2018-11-12 LAB — TESTOSTERONE: Testosterone: 244.72 ng/dL — ABNORMAL LOW (ref 300.00–890.00)

## 2018-11-12 LAB — VITAMIN B12: Vitamin B-12: 179 pg/mL — ABNORMAL LOW (ref 211–911)

## 2018-11-12 MED ORDER — VITAMIN D (ERGOCALCIFEROL) 1.25 MG (50000 UNIT) PO CAPS: 50000.0000 [IU] | ORAL_CAPSULE | ORAL | 0 refills | Status: DC

## 2018-11-12 MED ORDER — SOLIFENACIN SUCCINATE 5 MG PO TABS: 5.0000 mg | ORAL_TABLET | Freq: Every day | ORAL | 3 refills | Status: DC

## 2018-11-12 MED ORDER — METFORMIN HCL ER 500 MG PO TB24: ORAL_TABLET | ORAL | 3 refills | Status: DC

## 2018-11-12 NOTE — Patient Instructions (Addendum)
You had the flu shot today  Please take all new medication as prescribed - the vesicare for bladder urgency  Please continue all other medications as before, and refills have been done if requested.  Please have the pharmacy call with any other refills you may need.  Please continue your efforts at being more active, low cholesterol diet, and weight control.  Please remember to call for you yearly eye appointment  Please keep your appointments with your specialists as you may have planned  Please go to the LAB in the Basement (turn left off the elevator) for the tests to be done today  You will be contacted by phone if any changes need to be made immediately.  Otherwise, you will receive a letter about your results with an explanation, but please check with MyChart first.  Please remember to sign up for MyChart if you have not done so, as this will be important to you in the future with finding out test results, communicating by private email, and scheduling acute appointments online when needed.  Please return in 6 months, or sooner if needed

## 2018-11-12 NOTE — Progress Notes (Signed)
Subjective:    Patient ID: Gregory Owens, male    DOB: [birth year], 48 y.o.   MRN: [id number]  HPI  Here to f/u; overall doing ok,  Pt denies chest pain, increasing sob or doe, wheezing, orthopnea, PND, increased LE swelling, palpitations, dizziness or syncope.  Pt denies new neurological symptoms such as new headache, or facial or extremity weakness or numbness.  Pt denies polydipsia, polyuria, or low sugar episode.  Pt states overall good compliance with meds, mostly trying to follow appropriate diet, with wt overall stable,  but little exercise however. Pt plans to call for eye appt.  Has not yet started on testosterone shots but now wanting to do this.  Denies urinary symptoms such as dysuria, frequency, flank pain, hematuria or n/v, fever, chills, but has had some urinary urgency without pain for many months..  Denies worsening reflux, abd pain, dysphagia, n/v, bowel change or blood. Past Medical History:  Diagnosis Date  . Abnormal EKG    NS ST-T EKG changes with negative Stress cardiolite 2003  . Anxiety   . Dehydration 2005   [place] , [place]  . Diabetes mellitus without complication ([place])   . Hyperglycemia   . Hyperlipidemia   . IBS (irritable bowel syndrome)   . OSA (obstructive sleep apnea)    resolved post surgery   Past Surgical History:  Procedure Laterality Date  . TONSILLECTOMY AND ADENOIDECTOMY      with above  . UVULOPALATOPHARYNGOPLASTY  2004   Post op bleeding complication;  Dr Janace Hoard  . WISDOM TOOTH EXTRACTION      reports that he has never smoked. He has never used smokeless tobacco. He reports that he does not drink alcohol or use drugs. family history includes Cirrhosis in his brother; Colon cancer in his paternal grandfather; Diabetes in his maternal grandmother and paternal grandmother; Heart attack (age of onset: 87) in his maternal grandmother and paternal grandmother; Hyperlipidemia in his father and mother; Hypertension in his father and mother; Other  in his mother. No Known Allergies Current Outpatient Medications on File Prior to Visit  Medication Sig Dispense Refill  . aspirin 81 MG EC tablet TAKE 1 TABLET (81 MG TOTAL) BY MOUTH DAILY. 90 tablet 3  . BAYER MICROLET LANCETS lancets Use to help check blood sugar daily Dx E11.9 100 each 3  . Blood Glucose Monitoring Suppl (BAYER CONTOUR NEXT MONITOR) w/Device KIT Use as directed to check blood sugars daily Dx e11.9 1 kit 0  . cetirizine (ZYRTEC) 10 MG tablet Take 10 mg by mouth daily.      . EFFEXOR XR 150 MG 24 hr capsule TAKE 1 CAPSULE BY MOUTH EVERY DAY 90 capsule 1  . famotidine (PEPCID) 40 MG tablet Take 1 tablet (40 mg total) by mouth at bedtime. 90 tablet 3  . glucose blood test strip Use as instructed twice a day E 11.9 Contour Next Strips 100 each 2  . ketoconazole (NIZORAL) 2 % shampoo APPLY TO SCALP AND FACE AND SHAMPOO TWICE WEEKLY  6  . losartan (COZAAR) 25 MG tablet Take 1 tablet (25 mg total) by mouth daily. 90 tablet 2  . pantoprazole (PROTONIX) 40 MG tablet Take 1 tablet (40 mg total) by mouth daily. 90 tablet 3  . Probiotic Product (ALIGN PO) Take by mouth.    . rosuvastatin (CRESTOR) 10 MG tablet Take 1 tablet (10 mg total) by mouth daily. 90 tablet 3  . testosterone cypionate (DEPO-TESTOSTERONE) 200 MG/ML injection Inject 1 mL (200 mg  total) into the muscle every 14 (fourteen) days. 10 mL 1   No current facility-administered medications on file prior to visit.    Review of Systems  Constitutional: Negative for other unusual diaphoresis or sweats HENT: Negative for ear discharge or swelling Eyes: Negative for other worsening visual disturbances Respiratory: Negative for stridor or other swelling  Gastrointestinal: Negative for worsening distension or other blood Genitourinary: Negative for retention or other urinary change Musculoskeletal: Negative for other MSK pain or swelling Skin: Negative for color change or other new lesions Neurological: Negative for  worsening tremors and other numbness  Psychiatric/Behavioral: Negative for worsening agitation or other fatigue All otherwise neg per pt    Objective:   Physical Exam BP 118/76   Pulse 96   Temp 98 F (36.7 C) (Oral)   Ht '5\' 8"'  (1.727 m)   Wt 269 lb (122 kg)   SpO2 99%   BMI 40.90 kg/m  VS noted,  Constitutional: Pt appears in NAD HENT: Head: NCAT.  Right Ear: External ear normal.  Left Ear: External ear normal.  Eyes: . Pupils are equal, round, and reactive to light. Conjunctivae and EOM are normal Nose: without d/c or deformity Neck: Neck supple. Gross normal ROM Cardiovascular: Normal rate and regular rhythm.   Pulmonary/Chest: Effort normal and breath sounds without rales or wheezing.  Abd:  Soft, NT, ND, + BS, no organomegaly Neurological: Pt is alert. At baseline orientation, motor grossly intact Skin: Skin is warm. No rashes, other new lesions, no LE edema Psychiatric: Pt behavior is normal without agitation  All otherwise neg per pt Lab Results  Component Value Date   WBC 10.5 03/28/2018   HGB 13.8 03/28/2018   HCT 41.9 03/28/2018   PLT 304.0 03/28/2018   GLUCOSE 143 (H) 11/12/2018   CHOL 111 11/12/2018   TRIG 126.0 11/12/2018   HDL 35.00 (L) 11/12/2018   LDLDIRECT 53.0 08/29/2017   LDLCALC 51 11/12/2018   ALT 14 11/12/2018   AST 15 11/12/2018   NA 139 11/12/2018   K 3.9 11/12/2018   CL 102 11/12/2018   CREATININE 0.95 11/12/2018   BUN 12 11/12/2018   CO2 27 11/12/2018   TSH 2.22 03/28/2018   PSA 0.63 03/28/2018   HGBA1C 8.2 (H) 11/12/2018   MICROALBUR 0.9 03/28/2018        Assessment & Plan:

## 2018-11-13 NOTE — Telephone Encounter (Signed)
Staff to contact pt regarding b12 shots, and ok for testosterone shots too

## 2018-11-14 ENCOUNTER — Ambulatory Visit: Payer: BC Managed Care – PPO

## 2018-11-16 ENCOUNTER — Encounter: Payer: Self-pay | Admitting: Internal Medicine

## 2018-11-16 NOTE — Assessment & Plan Note (Signed)
stable overall by history and exam, recent data reviewed with pt, and pt to continue medical treatment as before,  to f/u any worsening symptoms or concerns, for a1c 

## 2018-11-16 NOTE — Assessment & Plan Note (Signed)
stable overall by history and exam, recent data reviewed with pt, and pt to continue medical treatment as before,  to f/u any worsening symptoms or concerns  

## 2018-11-16 NOTE — Assessment & Plan Note (Signed)
For UA, likely OAB for vesicare trial,  to f/u any worsening symptoms or concerns

## 2018-11-16 NOTE — Assessment & Plan Note (Signed)
Ok for start replacement,  to f/u any worsening symptoms or concerns

## 2018-12-04 DIAGNOSIS — R41840 Attention and concentration deficit: Secondary | ICD-10-CM | POA: Diagnosis not present

## 2018-12-04 DIAGNOSIS — Z79899 Other long term (current) drug therapy: Secondary | ICD-10-CM | POA: Diagnosis not present

## 2018-12-04 DIAGNOSIS — F419 Anxiety disorder, unspecified: Secondary | ICD-10-CM | POA: Diagnosis not present

## 2018-12-04 DIAGNOSIS — F902 Attention-deficit hyperactivity disorder, combined type: Secondary | ICD-10-CM | POA: Diagnosis not present

## 2018-12-15 ENCOUNTER — Other Ambulatory Visit: Payer: Self-pay | Admitting: Internal Medicine

## 2018-12-28 ENCOUNTER — Ambulatory Visit
Admission: EM | Admit: 2018-12-28 | Discharge: 2018-12-28 | Disposition: A | Discharge status: Home / Self Care | Payer: BC Managed Care – PPO

## 2018-12-28 DIAGNOSIS — R21 Rash and other nonspecific skin eruption: Secondary | ICD-10-CM

## 2018-12-28 NOTE — ED Triage Notes (Signed)
Pt presents with complaints of discoloration to his abdomen x 1 month. There are three areas present on the top of the abdomen. The areas are a light brown and are bruise like in appearance. Pt denies any pain to the area or any other symptoms. Reports he has been lifting a lot of boxes recently.

## 2018-12-28 NOTE — Discharge Instructions (Signed)
No alarming signs on exam at this time. Continue to monitor for any increase in size/possible trauma. Follow up with PCP for further evaluation if size is increasing, or if spreading rash.

## 2018-12-28 NOTE — ED Provider Notes (Signed)
[name]    CSN: [id number] Arrival date & time: 12/28/18  1155      History   Chief Complaint Chief Complaint  Patient presents with  . Rash    HPI Gregory Owens is a 48 y.o. male.   48 year old male comes in for 1 month history of discoloration/rash to the abdomen. States first started as one, but has noticed 2 more since symptom onset. Denies any pain, itching, spreading erythema, warmth. Unknown if individual lesion is increasing in size. States he has been lifting boxes recently and wonders if that could have caused the lesions. Denies any abdominal pain, nausea, vomiting, diarrhea. Denies fever, chills, body aches. Has not noticed any association with blood glucose changes. Has not tried anything for the symptoms. States started getting anxious about the lesions and therefore came in for evaluation.      Past Medical History:  Diagnosis Date  . Abnormal EKG    NS ST-T EKG changes with negative Stress cardiolite 2003  . Anxiety   . Dehydration 2005   [place] , [place]  . Diabetes mellitus without complication ([place])   . Hyperglycemia   . Hyperlipidemia   . IBS (irritable bowel syndrome)   . OSA (obstructive sleep apnea)    resolved post surgery    Patient Active Problem List   Diagnosis Date Noted  . Urinary urgency 11/12/2018  . Possible exposure to STD 10/16/2017  . Hypogonadism in male 09/04/2017  . Bilateral foot pain 03/08/2017  . Dysphagia 08/29/2016  . ADHD 03/28/2016  . Morbid obesity ([place]) 12/06/2015  . Preventative health care 03/10/2015  . Skin lesion 03/10/2015  . GERD (gastroesophageal reflux disease) 11/04/2013  . Tachycardia 11/04/2013  . Nonspecific abnormal electrocardiogram (ECG) (EKG) 11/04/2013  . Non-compliant behavior 10/14/2013  . Uncontrolled diabetes mellitus ([place]) 08/19/2012  . PLMD (periodic limb movement disorder) 05/30/2012  . SLEEP DISORDER 12/05/2009  . Hyperlipidemia 08/25/2009  . GAD (generalized  anxiety disorder) 08/25/2009  . Obstructive sleep apnea 08/25/2009  . RHINITIS 05/24/2009    Past Surgical History:  Procedure Laterality Date  . TONSILLECTOMY AND ADENOIDECTOMY      with above  . UVULOPALATOPHARYNGOPLASTY  2004   Post op bleeding complication;  Dr Janace Hoard  . WISDOM TOOTH EXTRACTION         Home Medications    Prior to Admission medications   Medication Sig Start Date End Date Taking? Authorizing Provider  aspirin 81 MG EC tablet TAKE 1 TABLET (81 MG TOTAL) BY MOUTH DAILY. 03/25/17  Yes Biagio Borg, MD  BAYER MICROLET LANCETS lancets Use to help check blood sugar daily Dx E11.9 04/20/15  Yes Biagio Borg, MD  Blood Glucose Monitoring Suppl (BAYER CONTOUR NEXT MONITOR) w/Device KIT Use as directed to check blood sugars daily Dx e11.9 04/20/15  Yes Biagio Borg, MD  cetirizine (ZYRTEC) 10 MG tablet Take 10 mg by mouth daily.     Yes [provider]  EFFEXOR XR 150 MG 24 hr capsule TAKE 1 CAPSULE BY MOUTH EVERY DAY 10/13/18  Yes Biagio Borg, MD  famotidine (PEPCID) 40 MG tablet Take 1 tablet (40 mg total) by mouth at bedtime. 05/19/18  Yes Biagio Borg, MD  glucose blood test strip Use as instructed twice a day E 11.9 Contour Next Strips 03/21/18  Yes Biagio Borg, MD  ketoconazole (NIZORAL) 2 % shampoo APPLY TO SCALP AND FACE AND SHAMPOO TWICE WEEKLY 03/23/17  Yes [provider]  losartan (COZAAR) 25 MG tablet Take 1 tablet by mouth once daily 12/16/18  Yes Biagio Borg, MD  metFORMIN (GLUCOPHAGE-XR) 500 MG 24 hr tablet TAKE 4 TABLETS BY MOUTH ONCE DAILY WITH BREAKFAST 11/12/18  Yes Biagio Borg, MD  pantoprazole (PROTONIX) 40 MG tablet Take 1 tablet (40 mg total) by mouth daily. 03/28/16  Yes Biagio Borg, MD  Probiotic Product (ALIGN PO) Take by mouth.   Yes [provider]  rosuvastatin (CRESTOR) 10 MG tablet Take 1 tablet (10 mg total) by mouth daily. 05/19/18  Yes Biagio Borg, MD  solifenacin (VESICARE) 5 MG tablet Take 1 tablet (5  mg total) by mouth daily. 11/12/18  Yes Biagio Borg, MD  Vitamin D, Ergocalciferol, (DRISDOL) 1.25 MG (50000 UT) CAPS capsule Take 1 capsule (50,000 Units total) by mouth every 7 (seven) days. 11/12/18  Yes Biagio Borg, MD  testosterone cypionate (DEPO-TESTOSTERONE) 200 MG/ML injection Inject 1 mL (200 mg total) into the muscle every 14 (fourteen) days. 04/19/18 12/28/18  Biagio Borg, MD    Family History Family History  Problem Relation Age of Onset  . Hyperlipidemia Mother   . Hypertension Mother   . Other Mother        Inflammatory Occipital Lymphadenitis  . Hypertension Father   . Hyperlipidemia Father   . Diabetes Maternal Grandmother   . Heart attack Maternal Grandmother 60  . Diabetes Paternal Grandmother   . Heart attack Paternal Grandmother 58  . Cirrhosis Brother        autoimmune hepatitis (twin)  . Colon cancer Paternal Grandfather     Social History Social History   Tobacco Use  . Smoking status: Never Smoker  . Smokeless tobacco: Never Used  Substance Use Topics  . Alcohol use: No  . Drug use: No     Allergies   Patient has no known allergies.   Review of Systems Review of Systems  Reason unable to perform ROS: See HPI as above.     Physical Exam Triage Vital Signs ED Triage Vitals  Enc Vitals Group     BP 12/28/18 1211 (!) 137/92     Pulse Rate 12/28/18 1211 89     Resp 12/28/18 1211 19     Temp 12/28/18 1211 98.4 F (36.9 C)     Temp src --      SpO2 12/28/18 1211 95 %     Weight --      Height --      Head Circumference --      Peak Flow --      Pain Score 12/28/18 1209 0     Pain Loc --      Pain Edu? --      Excl. in [place]? --    No data found.  Updated Vital Signs BP (!) 137/92   Pulse 89   Temp 98.4 F (36.9 C)   Resp 19   SpO2 95%   Physical Exam Constitutional:      General: He is not in acute distress.    Appearance: He is well-developed. He is not diaphoretic.  HENT:     Head: Normocephalic and atraumatic.   Eyes:     General: No scleral icterus.    Extraocular Movements: Extraocular movements intact.     Conjunctiva/sclera: Conjunctivae normal.     Pupils: Pupils are equal, round, and reactive to light.  Pulmonary:     Effort: Pulmonary effort is normal. No respiratory distress.  Abdominal:  General: Bowel sounds are normal.     Palpations: Abdomen is soft.     Tenderness: There is no abdominal tenderness. There is no guarding or rebound.       Comments: See picture below. 3 hyperpigmented non-raised lesions. ?healing bruise. No tenderness, warmth.   Skin:    General: Skin is warm and dry.     Comments: See picture below.   Neurological:     Mental Status: He is alert and oriented to person, place, and time.         UC Treatments / Results  Labs (all labs ordered are listed, but only abnormal results are displayed) Labs Reviewed - No data to display  EKG   Radiology No results found.  Procedures Procedures (including critical care time)  Medications Ordered in UC Medications - No data to display  Initial Impression / Assessment and Plan / UC Course  I have reviewed the triage vital signs and the nursing notes.  Pertinent labs & imaging results that were available during my care of the patient were reviewed by me and considered in my medical decision making (see chart for details).    No alarming signs at this time. Will have patient avoid lifting boxes at this time as box may be hitting the abdomen. Warm compress to the area for possible bruising. Monitor sizing changes. Follow up with PCP for further evaluation if symptoms not improving. Patient expresses understanding and agrees to plan.  Final Clinical Impressions(s) / UC Diagnoses   Final diagnoses:  Rash   ED Prescriptions    None     PDMP not reviewed this encounter.   Ok Edwards, PA-C 12/28/18 1257

## 2019-01-27 ENCOUNTER — Other Ambulatory Visit: Payer: Self-pay | Admitting: Internal Medicine

## 2019-01-27 ENCOUNTER — Ambulatory Visit: Payer: BC Managed Care – PPO | Admitting: Internal Medicine

## 2019-01-27 MED ORDER — TESTOSTERONE CYPIONATE 200 MG/ML IM SOLN: 200.0000 mg | INTRAMUSCULAR | 1 refills | Status: DC

## 2019-01-27 NOTE — Telephone Encounter (Signed)
The escript did not go through..need to be resent.Gregory KitchenJohny Chess

## 2019-01-27 NOTE — Telephone Encounter (Signed)
Tried twice to send, can you call the pharmacy to find out if the issue is on their end? thanks

## 2019-01-27 NOTE — Telephone Encounter (Signed)
The second request went through " E-Prescribing Status: Receipt confirmed by pharmacy (01/27/2019 12:59 PM EST)".Marland KitchenJohny Owens

## 2019-01-27 NOTE — Telephone Encounter (Signed)
Done erx 

## 2019-01-30 ENCOUNTER — Encounter: Payer: Self-pay | Admitting: Emergency Medicine

## 2019-01-30 ENCOUNTER — Other Ambulatory Visit: Payer: Self-pay

## 2019-01-30 ENCOUNTER — Ambulatory Visit
Admission: EM | Admit: 2019-01-30 | Discharge: 2019-01-30 | Disposition: A | Discharge status: Home / Self Care | Payer: BC Managed Care – PPO

## 2019-01-30 DIAGNOSIS — H9202 Otalgia, left ear: Secondary | ICD-10-CM

## 2019-01-30 NOTE — ED Triage Notes (Signed)
Pt presents to [facility] for assessment of left ear pain x 6-7 days.  Patient states "I like to play around in my ears".

## 2019-01-30 NOTE — ED Notes (Signed)
Patient able to ambulate independently  

## 2019-01-30 NOTE — Discharge Instructions (Signed)
Continue Zyrtec. Use Flonase as discussed for the next 2 weeks. Increase water intake. May try Tylenol, ibuprofen as needed for pain. Follow-up with primary care for persistent, worsening symptoms. Return for worsening pain, change in hearing, fever.

## 2019-01-30 NOTE — ED Provider Notes (Signed)
[name]    CSN: [id number] Arrival date & time: 01/30/19  1424      History   Chief Complaint Chief Complaint  Patient presents with  . Ear Pain    HPI Gregory Owens is a 49 y.o. male with history of OSA, diabetes presenting for week long course of left ear pain.  Patient states sometimes it will be itchy, sometimes then shooting pains.  Seems to be infrequent, without known exacerbating relieving factors.  Patient states he does like to "play around my ears ".  States he uses Q-tips to do so.  Denies perforation, bloody discharge, severe shooting pain.  No recent travel, trauma to the area, or prolonged water exposure.  Denies eye pain, nose pain, throat pain, headache, fever.  Has not tried thing for symptoms.   Past Medical History:  Diagnosis Date  . Abnormal EKG    NS ST-T EKG changes with negative Stress cardiolite 2003  . Anxiety   . Dehydration 2005   [place] , [place]  . Diabetes mellitus without complication ([place])   . Hyperglycemia   . Hyperlipidemia   . IBS (irritable bowel syndrome)   . OSA (obstructive sleep apnea)    resolved post surgery    Patient Active Problem List   Diagnosis Date Noted  . Urinary urgency 11/12/2018  . Possible exposure to STD 10/16/2017  . Hypogonadism in male 09/04/2017  . Bilateral foot pain 03/08/2017  . Dysphagia 08/29/2016  . ADHD 03/28/2016  . Morbid obesity ([place]) 12/06/2015  . Preventative health care 03/10/2015  . Skin lesion 03/10/2015  . GERD (gastroesophageal reflux disease) 11/04/2013  . Tachycardia 11/04/2013  . Nonspecific abnormal electrocardiogram (ECG) (EKG) 11/04/2013  . Non-compliant behavior 10/14/2013  . Uncontrolled diabetes mellitus ([place]) 08/19/2012  . PLMD (periodic limb movement disorder) 05/30/2012  . SLEEP DISORDER 12/05/2009  . Hyperlipidemia 08/25/2009  . GAD (generalized anxiety disorder) 08/25/2009  . Obstructive sleep apnea 08/25/2009  . RHINITIS 05/24/2009    Past  Surgical History:  Procedure Laterality Date  . TONSILLECTOMY AND ADENOIDECTOMY      with above  . UVULOPALATOPHARYNGOPLASTY  2004   Post op bleeding complication;  Dr Janace Hoard  . WISDOM TOOTH EXTRACTION         Home Medications    Prior to Admission medications   Medication Sig Start Date End Date Taking? Authorizing Provider  aspirin 81 MG EC tablet TAKE 1 TABLET (81 MG TOTAL) BY MOUTH DAILY. 03/25/17   Biagio Borg, MD  BAYER MICROLET LANCETS lancets Use to help check blood sugar daily Dx E11.9 04/20/15   Biagio Borg, MD  Blood Glucose Monitoring Suppl (BAYER CONTOUR NEXT MONITOR) w/Device KIT Use as directed to check blood sugars daily Dx e11.9 04/20/15   Biagio Borg, MD  cetirizine (ZYRTEC) 10 MG tablet Take 10 mg by mouth daily.      [provider]  EFFEXOR XR 150 MG 24 hr capsule TAKE 1 CAPSULE BY MOUTH EVERY DAY 10/13/18   Biagio Borg, MD  famotidine (PEPCID) 40 MG tablet Take 1 tablet (40 mg total) by mouth at bedtime. 05/19/18   Biagio Borg, MD  glucose blood test strip Use as instructed twice a day E 11.9 Contour Next Strips 03/21/18   Biagio Borg, MD  ketoconazole (NIZORAL) 2 % shampoo APPLY TO SCALP AND FACE AND SHAMPOO TWICE WEEKLY 03/23/17   [provider]  losartan (COZAAR) 25 MG tablet Take 1 tablet by mouth once  daily 12/16/18   Biagio Borg, MD  metFORMIN (GLUCOPHAGE-XR) 500 MG 24 hr tablet TAKE 4 TABLETS BY MOUTH ONCE DAILY WITH BREAKFAST 11/12/18   Biagio Borg, MD  pantoprazole (PROTONIX) 40 MG tablet Take 1 tablet (40 mg total) by mouth daily. 03/28/16   Biagio Borg, MD  Probiotic Product (ALIGN PO) Take by mouth.    [provider]  rosuvastatin (CRESTOR) 10 MG tablet Take 1 tablet (10 mg total) by mouth daily. 05/19/18   Biagio Borg, MD  solifenacin (VESICARE) 5 MG tablet Take 1 tablet (5 mg total) by mouth daily. 11/12/18   Biagio Borg, MD  testosterone cypionate (DEPOTESTOSTERONE CYPIONATE) 200 MG/ML injection Inject 1 mL  (200 mg total) into the muscle every 14 (fourteen) days. 01/27/19   Biagio Borg, MD  Vitamin D, Ergocalciferol, (DRISDOL) 1.25 MG (50000 UT) CAPS capsule Take 1 capsule (50,000 Units total) by mouth every 7 (seven) days. 11/12/18   Biagio Borg, MD    Family History Family History  Problem Relation Age of Onset  . Hyperlipidemia Mother   . Hypertension Mother   . Other Mother        Inflammatory Occipital Lymphadenitis  . Hypertension Father   . Hyperlipidemia Father   . Diabetes Maternal Grandmother   . Heart attack Maternal Grandmother 60  . Diabetes Paternal Grandmother   . Heart attack Paternal Grandmother 79  . Cirrhosis Brother        autoimmune hepatitis (twin)  . Colon cancer Paternal Grandfather     Social History Social History   Tobacco Use  . Smoking status: Never Smoker  . Smokeless tobacco: Never Used  Substance Use Topics  . Alcohol use: No  . Drug use: No     Allergies   Patient has no known allergies.   Review of Systems Review of Systems  Constitutional: Negative for fatigue and fever.  HENT: Positive for ear pain. Negative for congestion, dental problem, ear discharge, facial swelling, hearing loss, sinus pain, sore throat, trouble swallowing and voice change.   Eyes: Negative for photophobia, pain and visual disturbance.  Respiratory: Negative for cough and shortness of breath.   Cardiovascular: Negative for chest pain and palpitations.  Gastrointestinal: Negative for abdominal pain, diarrhea and vomiting.  Musculoskeletal: Negative for arthralgias and myalgias.  Skin: Negative for rash and wound.  Neurological: Negative for dizziness, speech difficulty and headaches.  All other systems reviewed and are negative.    Physical Exam Triage Vital Signs ED Triage Vitals  Enc Vitals Group     BP 01/30/19 1434 (!) 158/94     Pulse Rate 01/30/19 1434 (!) 106     Resp 01/30/19 1434 16     Temp 01/30/19 1434 97.9 F (36.6 C)     Temp Source  01/30/19 1434 Oral     SpO2 01/30/19 1434 93 %     Weight --      Height --      Head Circumference --      Peak Flow --      Pain Score 01/30/19 1436 5     Pain Loc --      Pain Edu? --      Excl. in [place]? --    No data found.  Updated Vital Signs BP (!) 158/94 (BP Location: Left Arm)   Pulse (!) 106   Temp 97.9 F (36.6 C) (Oral)   Resp 16   SpO2 93%   Visual Acuity  Right Eye Distance:   Left Eye Distance:   Bilateral Distance:    Right Eye Near:   Left Eye Near:    Bilateral Near:     Physical Exam Constitutional:      General: He is not in acute distress.    Appearance: He is obese. He is not ill-appearing.  HENT:     Head: Normocephalic and atraumatic.     Right Ear: Tympanic membrane, ear canal and external ear normal. There is no impacted cerumen.     Left Ear: Tympanic membrane, ear canal and external ear normal. There is no impacted cerumen.     Ears:     Comments: Negative tragal tenderness and no foreign bodies identified bilaterally    Nose: Nose normal.     Comments: No sinus tenderness    Mouth/Throat:     Mouth: Mucous membranes are moist.     Pharynx: Oropharynx is clear. No oropharyngeal exudate or posterior oropharyngeal erythema.  Eyes:     General: No scleral icterus.    Conjunctiva/sclera: Conjunctivae normal.     Pupils: Pupils are equal, round, and reactive to light.  Cardiovascular:     Rate and Rhythm: Regular rhythm. Tachycardia present.     Comments: HR 97-101 at bedside Pulmonary:     Effort: Pulmonary effort is normal. No respiratory distress.     Breath sounds: No wheezing.  Musculoskeletal:     Cervical back: Normal range of motion and neck supple. No rigidity or tenderness.  Lymphadenopathy:     Cervical: No cervical adenopathy.  Skin:    Capillary Refill: Capillary refill takes less than 2 seconds.     Coloration: Skin is not jaundiced or pale.  Neurological:     Mental Status: He is alert and oriented to person, place, and  time.      UC Treatments / Results  Labs (all labs ordered are listed, but only abnormal results are displayed) Labs Reviewed - No data to display  EKG   Radiology No results found.  Procedures Procedures (including critical care time)  Medications Ordered in UC Medications - No data to display  Initial Impression / Assessment and Plan / UC Course  I have reviewed the triage vital signs and the nursing notes.  Pertinent labs & imaging results that were available during my care of the patient were reviewed by me and considered in my medical decision making (see chart for details).     Patient afebrile, nontoxic in office.  HEENT exam unremarkable, patient without lymphadenopathy or fever.  Low concern for infectious process at this time.  Will trial antihistamine, intranasal steroid, increase hydration and have patient follow-up with PCP next week.  Return precautions discussed, patient verbalized understanding and is agreeable to plan. Final Clinical Impressions(s) / UC Diagnoses   Final diagnoses:  Left ear pain     Discharge Instructions     Continue Zyrtec. Use Flonase as discussed for the next 2 weeks. Increase water intake. May try Tylenol, ibuprofen as needed for pain. Follow-up with primary care for persistent, worsening symptoms. Return for worsening pain, change in hearing, fever.    ED Prescriptions    None     PDMP not reviewed this encounter.   Hall[place], [place], [place] 01/30/19 1543

## 2019-02-03 ENCOUNTER — Encounter: Payer: Self-pay | Admitting: Internal Medicine

## 2019-02-03 ENCOUNTER — Ambulatory Visit (INDEPENDENT_AMBULATORY_CARE_PROVIDER_SITE_OTHER): Payer: BC Managed Care – PPO | Admitting: Internal Medicine

## 2019-02-03 DIAGNOSIS — F411 Generalized anxiety disorder: Secondary | ICD-10-CM | POA: Diagnosis not present

## 2019-02-03 DIAGNOSIS — E1165 Type 2 diabetes mellitus with hyperglycemia: Secondary | ICD-10-CM | POA: Diagnosis not present

## 2019-02-03 DIAGNOSIS — R519 Headache, unspecified: Secondary | ICD-10-CM

## 2019-02-03 NOTE — Progress Notes (Signed)
Patient ID: Gregory Owens, male   DOB: [birth year], 49 y.o.   MRN: [id number]  Virtual Visit via Video Note  I connected with Gregory Owens on 02/03/19 at  9:20 AM EST by a video enabled telemedicine application and verified that I am speaking with the correct person using two identifiers.  Location: Patient: at home Provider: at office   I discussed the limitations of evaluation and management by telemedicine and the availability of in person appointments. The patient expressed understanding and agreed to proceed.  History of Present Illness: Here with c/o left sided HA mild intermittent non throbbing but dull that seemed to start after using a qtip to the left ear and may have pushed it in too far,  No other ear pain, reduced hearing, dizziness, fever, swellling or drainage.  Seen at [facility] yesterday with unclear diagnosis and rx antibx and flonase he plans to start today.  No vision changes, and Pt denies new neurological symptoms such as new headache, or facial or extremity weakness or numbness  Pt denies chest pain, increased sob or doe, wheezing, orthopnea, PND, increased LE swelling, palpitations, dizziness or syncope.   Pt denies polydipsia, polyuria. Denies worsening depressive symptoms, suicidal ideation, or panic; has ongoing anxiety Past Medical History:  Diagnosis Date  . Abnormal EKG    NS ST-T EKG changes with negative Stress cardiolite 2003  . Anxiety   . Dehydration 2005   [place] , [place]  . Diabetes mellitus without complication ([place])   . Hyperglycemia   . Hyperlipidemia   . IBS (irritable bowel syndrome)   . OSA (obstructive sleep apnea)    resolved post surgery   Past Surgical History:  Procedure Laterality Date  . TONSILLECTOMY AND ADENOIDECTOMY      with above  . UVULOPALATOPHARYNGOPLASTY  2004   Post op bleeding complication;  Dr Janace Hoard  . WISDOM TOOTH EXTRACTION      reports that he has never smoked. He has never used smokeless tobacco. He  reports that he does not drink alcohol or use drugs. family history includes Cirrhosis in his brother; Colon cancer in his paternal grandfather; Diabetes in his maternal grandmother and paternal grandmother; Heart attack (age of onset: 19) in his maternal grandmother and paternal grandmother; Hyperlipidemia in his father and mother; Hypertension in his father and mother; Other in his mother. No Known Allergies Current Outpatient Medications on File Prior to Visit  Medication Sig Dispense Refill  . aspirin 81 MG EC tablet TAKE 1 TABLET (81 MG TOTAL) BY MOUTH DAILY. 90 tablet 3  . cetirizine (ZYRTEC) 10 MG tablet Take 10 mg by mouth daily.      . EFFEXOR XR 150 MG 24 hr capsule TAKE 1 CAPSULE BY MOUTH EVERY DAY 90 capsule 1  . famotidine (PEPCID) 40 MG tablet Take 1 tablet (40 mg total) by mouth at bedtime. 90 tablet 3  . ketoconazole (NIZORAL) 2 % shampoo APPLY TO SCALP AND FACE AND SHAMPOO TWICE WEEKLY  6  . losartan (COZAAR) 25 MG tablet Take 1 tablet by mouth once daily 90 tablet 0  . metFORMIN (GLUCOPHAGE-XR) 500 MG 24 hr tablet TAKE 4 TABLETS BY MOUTH ONCE DAILY WITH BREAKFAST 360 tablet 3  . pantoprazole (PROTONIX) 40 MG tablet Take 1 tablet (40 mg total) by mouth daily. 90 tablet 3  . Probiotic Product (ALIGN PO) Take by mouth.    . rosuvastatin (CRESTOR) 10 MG tablet Take 1 tablet (10 mg total) by mouth daily. 90 tablet 3  .  solifenacin (VESICARE) 5 MG tablet Take 1 tablet (5 mg total) by mouth daily. 90 tablet 3  . testosterone cypionate (DEPOTESTOSTERONE CYPIONATE) 200 MG/ML injection Inject 1 mL (200 mg total) into the muscle every 14 (fourteen) days. 6 mL 1  . Vitamin D, Ergocalciferol, (DRISDOL) 1.25 MG (50000 UT) CAPS capsule Take 1 capsule (50,000 Units total) by mouth every 7 (seven) days. 12 capsule 0  . BAYER MICROLET LANCETS lancets Use to help check blood sugar daily Dx E11.9 100 each 3  . Blood Glucose Monitoring Suppl (BAYER CONTOUR NEXT MONITOR) w/Device KIT Use as directed  to check blood sugars daily Dx e11.9 1 kit 0  . glucose blood test strip Use as instructed twice a day E 11.9 Contour Next Strips 100 each 2   No current facility-administered medications on file prior to visit.    Observations/Objective: Alert, NAD, appropriate mood and affect, resps normal, cn 2-12 intact, moves all 4s, no visible rash or swelling Lab Results  Component Value Date   WBC 10.5 03/28/2018   HGB 13.8 03/28/2018   HCT 41.9 03/28/2018   PLT 304.0 03/28/2018   GLUCOSE 143 (H) 11/12/2018   CHOL 111 11/12/2018   TRIG 126.0 11/12/2018   HDL 35.00 (L) 11/12/2018   LDLDIRECT 53.0 08/29/2017   LDLCALC 51 11/12/2018   ALT 14 11/12/2018   AST 15 11/12/2018   NA 139 11/12/2018   K 3.9 11/12/2018   CL 102 11/12/2018   CREATININE 0.95 11/12/2018   BUN 12 11/12/2018   CO2 27 11/12/2018   TSH 2.22 03/28/2018   PSA 0.63 03/28/2018   HGBA1C 8.2 (H) 11/12/2018   MICROALBUR 0.9 03/28/2018   Assessment and Plan: See notes  Follow Up Instructions: See notes   I discussed the assessment and treatment plan with the patient. The patient was provided an opportunity to ask questions and all were answered. The patient agreed with the plan and demonstrated an understanding of the instructions.   The patient was advised to call back or seek an in-person evaluation if the symptoms worsen or if the condition fails to improve as anticipated.   Cathlean Cower, MD

## 2019-02-05 ENCOUNTER — Encounter: Payer: Self-pay | Admitting: Internal Medicine

## 2019-02-06 DIAGNOSIS — Z79899 Other long term (current) drug therapy: Secondary | ICD-10-CM | POA: Diagnosis not present

## 2019-02-06 DIAGNOSIS — F902 Attention-deficit hyperactivity disorder, combined type: Secondary | ICD-10-CM | POA: Diagnosis not present

## 2019-02-08 ENCOUNTER — Encounter: Payer: Self-pay | Admitting: Internal Medicine

## 2019-02-08 DIAGNOSIS — R519 Headache, unspecified: Secondary | ICD-10-CM | POA: Insufficient documentation

## 2019-02-08 NOTE — Assessment & Plan Note (Signed)
stable overall by history and exam, recent data reviewed with pt, and pt to continue medical treatment as before,  to f/u any worsening symptoms or concerns  

## 2019-02-08 NOTE — Patient Instructions (Signed)
Please continue all other medications as before, and refills have been done if requested.  Please have the pharmacy call with any other refills you may need.  Please continue your efforts at being more active, low cholesterol diet, and weight control.  Please keep your appointments with your specialists as you may have planned     

## 2019-02-08 NOTE — Assessment & Plan Note (Signed)
Etiology unclear, to cont tx as per UC, consider MRI if not improving

## 2019-02-10 ENCOUNTER — Ambulatory Visit (INDEPENDENT_AMBULATORY_CARE_PROVIDER_SITE_OTHER): Payer: BC Managed Care – PPO | Admitting: Otolaryngology

## 2019-02-19 ENCOUNTER — Ambulatory Visit (INDEPENDENT_AMBULATORY_CARE_PROVIDER_SITE_OTHER): Payer: BC Managed Care – PPO | Admitting: Otolaryngology

## 2019-02-19 ENCOUNTER — Other Ambulatory Visit: Payer: Self-pay

## 2019-02-19 VITALS — Temp 97.2°F

## 2019-02-19 DIAGNOSIS — H9313 Tinnitus, bilateral: Secondary | ICD-10-CM

## 2019-02-19 DIAGNOSIS — H9202 Otalgia, left ear: Secondary | ICD-10-CM | POA: Diagnosis not present

## 2019-02-19 NOTE — Progress Notes (Signed)
HPI: Gregory Owens is a 49 y.o. male who presents is referred by Dr. Jenny Reichmann for evaluation of left ear discomfort.  He apparently uses Q-tips frequently.  And apparently the left ear pain and headache started when he was using a Q-tip in the left ear.  He also complains of ringing in the ears.  He has had occasional headaches.  Denies any trouble breathing through his nose and no yellow-green discharge from the nose.  He was told that he might have sinus issues..  Symptoms apparently started over the Christmas holidays.  He is doing much better today.  Past Medical History:  Diagnosis Date  . Abnormal EKG    NS ST-T EKG changes with negative Stress cardiolite 2003  . Anxiety   . Dehydration 2005   [place] , [place]  . Diabetes mellitus without complication ([place])   . Hyperglycemia   . Hyperlipidemia   . IBS (irritable bowel syndrome)   . OSA (obstructive sleep apnea)    resolved post surgery   Past Surgical History:  Procedure Laterality Date  . TONSILLECTOMY AND ADENOIDECTOMY      with above  . UVULOPALATOPHARYNGOPLASTY  2004   Post op bleeding complication;  Dr Janace Hoard  . WISDOM TOOTH EXTRACTION     Social History   Socioeconomic History  . Marital status: Married    Spouse name: Not on file  . Number of children: Not on file  . Years of education: Not on file  . Highest education level: Not on file  Occupational History  . 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 Smoking status: Never Smoker  . Smokeless tobacco: Never Used  Substance and Sexual Activity  . Alcohol use: No  . Drug use: No  . Sexual activity: Not on file  Other Topics Concern  . Not on file  Social History Narrative   Regular exercise: no            Social Determinants of Health   Financial Resource Strain:   . Difficulty of Paying Living Expenses: Not on file  Food Insecurity:   . Worried About Charity fundraiser in the Last Year: Not on file  . Ran  Out of Food in the Last Year: Not on file  Transportation Needs:   . Lack of Transportation (Medical): Not on file  . Lack of Transportation (Non-Medical): Not on file  Physical Activity:   . Days of Exercise per Week: Not on file  . Minutes of Exercise per Session: Not on file  Stress:   . Feeling of Stress : Not on file  Social Connections:   . Frequency of Communication with Friends and Family: Not on file  . Frequency of Social Gatherings with Friends and Family: Not on file  . Attends Religious Services: Not on file  . Active Member of Clubs or Organizations: Not on file  . Attends Archivist Meetings: Not on file  . Marital Status: Not on file   Family History  Problem Relation Age of Onset  . Hyperlipidemia Mother   . Hypertension Mother   . Other Mother        Inflammatory Occipital Lymphadenitis  . Hypertension Father   . Hyperlipidemia Father   . Diabetes Maternal Grandmother   . Heart attack Maternal Grandmother 60  . Diabetes Paternal Grandmother   . Heart attack Paternal Grandmother 35  . Cirrhosis Brother        autoimmune hepatitis (  twin)  . Colon cancer Paternal Grandfather    No Known Allergies Prior to Admission medications   Medication Sig Start Date End Date Taking? Authorizing Provider  aspirin 81 MG EC tablet TAKE 1 TABLET (81 MG TOTAL) BY MOUTH DAILY. 03/25/17   Biagio Borg, MD  BAYER MICROLET LANCETS lancets Use to help check blood sugar daily Dx E11.9 04/20/15   Biagio Borg, MD  Blood Glucose Monitoring Suppl (BAYER CONTOUR NEXT MONITOR) w/Device KIT Use as directed to check blood sugars daily Dx e11.9 04/20/15   Biagio Borg, MD  cetirizine (ZYRTEC) 10 MG tablet Take 10 mg by mouth daily.      [provider]  EFFEXOR XR 150 MG 24 hr capsule TAKE 1 CAPSULE BY MOUTH EVERY DAY 10/13/18   Biagio Borg, MD  famotidine (PEPCID) 40 MG tablet Take 1 tablet (40 mg total) by mouth at bedtime. 05/19/18   Biagio Borg, MD  glucose blood  test strip Use as instructed twice a day E 11.9 Contour Next Strips 03/21/18   Biagio Borg, MD  ketoconazole (NIZORAL) 2 % shampoo APPLY TO SCALP AND FACE AND SHAMPOO TWICE WEEKLY 03/23/17   [provider]  losartan (COZAAR) 25 MG tablet Take 1 tablet by mouth once daily 12/16/18   Biagio Borg, MD  metFORMIN (GLUCOPHAGE-XR) 500 MG 24 hr tablet TAKE 4 TABLETS BY MOUTH ONCE DAILY WITH BREAKFAST 11/12/18   Biagio Borg, MD  pantoprazole (PROTONIX) 40 MG tablet Take 1 tablet (40 mg total) by mouth daily. 03/28/16   Biagio Borg, MD  Probiotic Product (ALIGN PO) Take by mouth.    [provider]  rosuvastatin (CRESTOR) 10 MG tablet Take 1 tablet (10 mg total) by mouth daily. 05/19/18   Biagio Borg, MD  solifenacin (VESICARE) 5 MG tablet Take 1 tablet (5 mg total) by mouth daily. 11/12/18   Biagio Borg, MD  testosterone cypionate (DEPOTESTOSTERONE CYPIONATE) 200 MG/ML injection Inject 1 mL (200 mg total) into the muscle every 14 (fourteen) days. 01/27/19   Biagio Borg, MD  Vitamin D, Ergocalciferol, (DRISDOL) 1.25 MG (50000 UT) CAPS capsule Take 1 capsule (50,000 Units total) by mouth every 7 (seven) days. 11/12/18   Biagio Borg, MD     Positive ROS: Otherwise negative  All other systems have been reviewed and were otherwise negative with the exception of those mentioned in the HPI and as above.  Physical Exam: Constitutional: Alert, well-appearing, no acute distress Ears: External ears without lesions or tenderness. Ear canals are clear bilaterally with intact.  He has no wax buildup in either ear.  There is no swelling or external otitis.  Both TMs are clear with good mobility on pneumatic otoscopy.  On hearing screening with a 512 1024 tuning fork he hears about the same in both ears but does have a mild upper frequency sensorineural hearing loss with a 1024 tuning fork.  AC > BC bilaterally. Nasal: External nose without lesions. Septum with minimal deformity..  Mild  rhinitis.  Both middle meatus regions are clear with no edema or mucopurulent discharge.  No clinical signs of infection. Oral: Lips and gums without lesions. Tongue and palate mucosa without lesions. Posterior oropharynx clear.  Patient is status post UPPP and tonsillectomy. Neck: No palpable adenopathy or masses Respiratory: Breathing comfortably  Skin: No facial/neck lesions or rash noted.  Procedures  Assessment: Left ear pain is doing better presently.  Ear is normal to examination.  He probably has some degree of upper frequency sensorineural hearing loss with secondary tinnitus.  Plan: Recommended not using Q-tips in his ears in the future.  He does not appear to be a heavy wax producer. Reviewed tinnitus with him and that there is limited treatment for this.  Discussed with him concerning using masking noise if it is bothersome. If it becomes worse he will call us back to schedule audiologic testing if needed.   Radene Journey, MD   CC:

## 2019-02-25 ENCOUNTER — Telehealth: Payer: Self-pay

## 2019-02-25 NOTE — Telephone Encounter (Signed)
Please advise of below message

## 2019-02-25 NOTE — Telephone Encounter (Signed)
Spoke with patient regarding appointment that he made for Thursday 02/26/2019 through [facility] for pressure on left side of face and head. States that it is a headache that has been off and on since Christmas 2020. States that he did go to the ENT last week and had a DOXY visit with Dr Jonny Ruiz on 02/03/2019. States that he would like for this to be a face to face visit. Would this be okay?

## 2019-02-25 NOTE — Telephone Encounter (Signed)
Ok for ROV if o/w passes covid screening

## 2019-02-25 NOTE — Telephone Encounter (Signed)
Called patient and he stated that he was asked the screening questions for COVID already two times today and he passed the screening.   Appointment is scheduled for 02/26/2019 in the a.m

## 2019-02-26 ENCOUNTER — Encounter: Payer: Self-pay | Admitting: Internal Medicine

## 2019-02-26 ENCOUNTER — Other Ambulatory Visit: Payer: Self-pay | Admitting: Internal Medicine

## 2019-02-26 ENCOUNTER — Ambulatory Visit
Admission: RE | Admit: 2019-02-26 | Discharge: 2019-02-26 | Disposition: A | Discharge status: Home / Self Care | Payer: BC Managed Care – PPO | Source: Ambulatory Visit | Attending: Internal Medicine | Admitting: Internal Medicine

## 2019-02-26 ENCOUNTER — Other Ambulatory Visit: Payer: Self-pay

## 2019-02-26 ENCOUNTER — Ambulatory Visit (INDEPENDENT_AMBULATORY_CARE_PROVIDER_SITE_OTHER): Payer: BC Managed Care – PPO | Admitting: Internal Medicine

## 2019-02-26 VITALS — BP 138/82 | HR 112 | Temp 98.3°F | Ht 68.0 in | Wt 263.0 lb

## 2019-02-26 DIAGNOSIS — E1165 Type 2 diabetes mellitus with hyperglycemia: Secondary | ICD-10-CM

## 2019-02-26 DIAGNOSIS — R519 Headache, unspecified: Secondary | ICD-10-CM

## 2019-02-26 DIAGNOSIS — F419 Anxiety disorder, unspecified: Secondary | ICD-10-CM | POA: Insufficient documentation

## 2019-02-26 NOTE — Assessment & Plan Note (Signed)
Mod to severe situational over this issue, reassured,  to f/u any worsening symptoms or concerns

## 2019-02-26 NOTE — Assessment & Plan Note (Addendum)
Etiology unclear, atypical for trigeminal neuralgia, ent exam benign, for MRI  I spent 33 minutes preparing to see the patient by review of recent labs, imaging and procedures, obtaining and reviewing separately obtained history, communicating with the patient and family or caregiver, ordering medications, tests or procedures, and documenting clinical information in the EHR including the differential Dx, treatment, and any further evaluation and other management of HA, anxiety, DM

## 2019-02-26 NOTE — Progress Notes (Signed)
Subjective:    Patient ID: Gregory Owens, male    DOB: [birth year], 49 y.o.   MRN: [id number]  HPI  Here with c/o 3 wks onset persistent constant left head/facial dull mild to moderate discomfort but Pt denies new neurological symptoms such as new headache, or facial or extremity weakness or numbness  Saw ENT last wk with negative exam.  Pt denies chest pain, increased sob or doe, wheezing, orthopnea, PND, increased LE swelling, palpitations, dizziness or syncope.   Pt denies polydipsia, polyuria.  Pt denies fever, wt loss, night sweats, loss of appetite, or other constitutional symptoms  No ST, cough, fever Past Medical History:  Diagnosis Date  . Abnormal EKG    NS ST-T EKG changes with negative Stress cardiolite 2003  . Anxiety   . Dehydration 2005   [place] , [place]  . Diabetes mellitus without complication ([place])   . Hyperglycemia   . Hyperlipidemia   . IBS (irritable bowel syndrome)   . OSA (obstructive sleep apnea)    resolved post surgery   Past Surgical History:  Procedure Laterality Date  . TONSILLECTOMY AND ADENOIDECTOMY      with above  . UVULOPALATOPHARYNGOPLASTY  2004   Post op bleeding complication;  Dr Janace Hoard  . WISDOM TOOTH EXTRACTION      reports that he has never smoked. He has never used smokeless tobacco. He reports that he does not drink alcohol or use drugs. family history includes Cirrhosis in his brother; Colon cancer in his paternal grandfather; Diabetes in his maternal grandmother and paternal grandmother; Heart attack (age of onset: 87) in his maternal grandmother and paternal grandmother; Hyperlipidemia in his father and mother; Hypertension in his father and mother; Other in his mother. No Known Allergies Current Outpatient Medications on File Prior to Visit  Medication Sig Dispense Refill  . aspirin 81 MG EC tablet TAKE 1 TABLET (81 MG TOTAL) BY MOUTH DAILY. 90 tablet 3  . BAYER MICROLET LANCETS lancets Use to help check blood sugar daily Dx  E11.9 100 each 3  . Blood Glucose Monitoring Suppl (BAYER CONTOUR NEXT MONITOR) w/Device KIT Use as directed to check blood sugars daily Dx e11.9 1 kit 0  . cetirizine (ZYRTEC) 10 MG tablet Take 10 mg by mouth daily.      . EFFEXOR XR 150 MG 24 hr capsule TAKE 1 CAPSULE BY MOUTH EVERY DAY 90 capsule 1  . famotidine (PEPCID) 40 MG tablet Take 1 tablet (40 mg total) by mouth at bedtime. 90 tablet 3  . glucose blood test strip Use as instructed twice a day E 11.9 Contour Next Strips 100 each 2  . ketoconazole (NIZORAL) 2 % shampoo APPLY TO SCALP AND FACE AND SHAMPOO TWICE WEEKLY  6  . losartan (COZAAR) 25 MG tablet Take 1 tablet by mouth once daily 90 tablet 0  . metFORMIN (GLUCOPHAGE-XR) 500 MG 24 hr tablet TAKE 4 TABLETS BY MOUTH ONCE DAILY WITH BREAKFAST 360 tablet 3  . pantoprazole (PROTONIX) 40 MG tablet Take 1 tablet (40 mg total) by mouth daily. 90 tablet 3  . Probiotic Product (ALIGN PO) Take by mouth.    . rosuvastatin (CRESTOR) 10 MG tablet Take 1 tablet (10 mg total) by mouth daily. 90 tablet 3  . solifenacin (VESICARE) 5 MG tablet Take 1 tablet (5 mg total) by mouth daily. 90 tablet 3  . testosterone cypionate (DEPOTESTOSTERONE CYPIONATE) 200 MG/ML injection Inject 1 mL (200 mg total) into the muscle every 14 (fourteen) days. 6  mL 1  . Vitamin D, Ergocalciferol, (DRISDOL) 1.25 MG (50000 UT) CAPS capsule Take 1 capsule (50,000 Units total) by mouth every 7 (seven) days. 12 capsule 0   No current facility-administered medications on file prior to visit.   Review of Systems All otherwise neg per pt     Objective:   Physical Exam BP 138/82   Pulse (!) 112   Temp 98.3 F (36.8 C)   Ht '5\' 8"'  (1.727 m)   Wt 263 lb (119.3 kg)   SpO2 98%   BMI 39.99 kg/m  VS noted,  Constitutional: Pt appears in NAD HENT: Head: NCAT.  Right Ear: External ear normal.  Left Ear: External ear normal.  Eyes: . Pupils are equal, round, and reactive to light. Conjunctivae and EOM are normal Nose:  without d/c or deformity Neck: Neck supple. Gross normal ROM Cardiovascular: Normal rate and regular rhythm.   Pulmonary/Chest: Effort normal and breath sounds without rales or wheezing.  Abd:  Soft, NT, ND, + BS, no organomegaly Neurological: Pt is alert. At baseline orientation, motor grossly intact Skin: Skin is warm. No rashes, other new lesions, no LE edema Psychiatric: Pt behavior is normal without agitation  All otherwise neg per pt Lab Results  Component Value Date   WBC 10.5 03/28/2018   HGB 13.8 03/28/2018   HCT 41.9 03/28/2018   PLT 304.0 03/28/2018   GLUCOSE 143 (H) 11/12/2018   CHOL 111 11/12/2018   TRIG 126.0 11/12/2018   HDL 35.00 (L) 11/12/2018   LDLDIRECT 53.0 08/29/2017   LDLCALC 51 11/12/2018   ALT 14 11/12/2018   AST 15 11/12/2018   NA 139 11/12/2018   K 3.9 11/12/2018   CL 102 11/12/2018   CREATININE 0.95 11/12/2018   BUN 12 11/12/2018   CO2 27 11/12/2018   TSH 2.22 03/28/2018   PSA 0.63 03/28/2018   HGBA1C 8.2 (H) 11/12/2018   MICROALBUR 0.9 03/28/2018      Assessment & Plan:

## 2019-02-26 NOTE — Assessment & Plan Note (Signed)
stable overall by history and exam, recent data reviewed with pt, and pt to continue medical treatment as before,  to f/u any worsening symptoms or concerns  

## 2019-02-26 NOTE — Patient Instructions (Signed)
You will be contacted regarding the referral for: MRI for the brain (and head)  Please continue all other medications as before, and refills have been done if requested.  Please have the pharmacy call with any other refills you may need.  Please continue your efforts at being more active, low cholesterol diet, and weight control.  Please keep your appointments with your specialists as you may have planned

## 2019-03-05 ENCOUNTER — Encounter: Payer: Self-pay | Admitting: Internal Medicine

## 2019-03-05 DIAGNOSIS — E119 Type 2 diabetes mellitus without complications: Secondary | ICD-10-CM

## 2019-03-05 LAB — HM DIABETES EYE EXAM

## 2019-03-10 DIAGNOSIS — Z23 Encounter for immunization: Secondary | ICD-10-CM | POA: Diagnosis not present

## 2019-03-15 ENCOUNTER — Encounter: Payer: Self-pay | Admitting: Internal Medicine

## 2019-04-05 ENCOUNTER — Ambulatory Visit (HOSPITAL_COMMUNITY)
Admission: EM | Admit: 2019-04-05 | Discharge: 2019-04-05 | Disposition: A | Discharge status: Home / Self Care | Payer: BC Managed Care – PPO | Attending: Emergency Medicine | Admitting: Emergency Medicine

## 2019-04-05 ENCOUNTER — Other Ambulatory Visit: Payer: Self-pay

## 2019-04-05 DIAGNOSIS — R519 Headache, unspecified: Secondary | ICD-10-CM | POA: Diagnosis not present

## 2019-04-05 LAB — C-REACTIVE PROTEIN: CRP: 0.8 mg/dL (ref <1.0)

## 2019-04-05 LAB — SEDIMENTATION RATE: Sed Rate: 30 mm/h — ABNORMAL HIGH (ref 0–16)

## 2019-04-05 MED ORDER — KETOROLAC TROMETHAMINE 60 MG/2ML IM SOLN
INTRAMUSCULAR | Status: AC
  Filled 2019-04-05: qty 2

## 2019-04-05 MED ORDER — KETOROLAC TROMETHAMINE 60 MG/2ML IM SOLN
60.0000 mg | Freq: Once | INTRAMUSCULAR | Status: AC
  Administered 2019-04-05: 60 mg via INTRAMUSCULAR

## 2019-04-05 NOTE — ED Provider Notes (Signed)
[place]    CSN: [id number] Arrival date & time: 04/05/19  1115      History   Chief Complaint Chief Complaint  Patient presents with  . Headache    HPI Gregory Owens is a 49 y.o. male.   Gregory Owens [facility] presents with complaints of headache. This has been recurrent since December, waxes and wanes. Was worse last night and this morning, hard time sleeping, which prompted him to be seen. Took tylenol and melatonin last night which didn't help. No new fatigue, no weight loss, no fevers, no dizziness, no light sensitivity, no nausea or vomiting, no vision changes, no jaw pain. No change in diet. Has had some increased stress, wife endorses this may be contributing. Temporal as well as frontal headache. MRI completed through his PCP on 1/28 which was normal and without acute findings. Patient states he is concerned about temporal arteritis. No new medications recently. Doesn't smoke. Doesn't drink ETOH. Endorses history of anxiety, as well as IBS, DM, adhd. Just recently had an eye exam.     ROS per HPI, negative if not otherwise mentioned.      Past Medical History:  Diagnosis Date  . Abnormal EKG    NS ST-T EKG changes with negative Stress cardiolite 2003  . Anxiety   . Dehydration 2005   [place] , [place]  . Diabetes mellitus without complication ([place])   . Hyperglycemia   . Hyperlipidemia   . IBS (irritable bowel syndrome)   . OSA (obstructive sleep apnea)    resolved post surgery    Patient Active Problem List   Diagnosis Date Noted  . Anxiety 02/26/2019  . Headache 02/08/2019  . Urinary urgency 11/12/2018  . Possible exposure to STD 10/16/2017  . Hypogonadism in male 09/04/2017  . Bilateral foot pain 03/08/2017  . Dysphagia 08/29/2016  . ADHD 03/28/2016  . Morbid obesity ([place]) 12/06/2015  . Preventative health care 03/10/2015  . Skin lesion 03/10/2015  . GERD (gastroesophageal reflux disease) 11/04/2013  . Tachycardia 11/04/2013    . Nonspecific abnormal electrocardiogram (ECG) (EKG) 11/04/2013  . Non-compliant behavior 10/14/2013  . Uncontrolled diabetes mellitus ([place]) 08/19/2012  . PLMD (periodic limb movement disorder) 05/30/2012  . SLEEP DISORDER 12/05/2009  . Hyperlipidemia 08/25/2009  . GAD (generalized anxiety disorder) 08/25/2009  . Obstructive sleep apnea 08/25/2009  . RHINITIS 05/24/2009    Past Surgical History:  Procedure Laterality Date  . TONSILLECTOMY AND ADENOIDECTOMY      with above  . UVULOPALATOPHARYNGOPLASTY  2004   Post op bleeding complication;  Dr Janace Hoard  . WISDOM TOOTH EXTRACTION         Home Medications    Prior to Admission medications   Medication Sig Start Date End Date Taking? Authorizing Provider  aspirin 81 MG EC tablet TAKE 1 TABLET (81 MG TOTAL) BY MOUTH DAILY. 03/25/17  Yes Biagio Borg, MD  cetirizine (ZYRTEC) 10 MG tablet Take 10 mg by mouth daily.     Yes [provider]  EFFEXOR XR 150 MG 24 hr capsule TAKE 1 CAPSULE BY MOUTH EVERY DAY 10/13/18  Yes Biagio Borg, MD  famotidine (PEPCID) 40 MG tablet Take 1 tablet (40 mg total) by mouth at bedtime. 05/19/18  Yes Biagio Borg, MD  losartan (COZAAR) 25 MG tablet Take 1 tablet by mouth once daily 12/16/18  Yes Biagio Borg, MD  metFORMIN (GLUCOPHAGE-XR) 500 MG 24 hr tablet TAKE 4 TABLETS BY MOUTH ONCE DAILY WITH BREAKFAST 11/12/18  Yes  Biagio Borg, MD  pantoprazole (PROTONIX) 40 MG tablet Take 1 tablet (40 mg total) by mouth daily. 03/28/16  Yes Biagio Borg, MD  Probiotic Product (ALIGN PO) Take by mouth.   Yes [provider]  rosuvastatin (CRESTOR) 10 MG tablet Take 1 tablet (10 mg total) by mouth daily. 05/19/18  Yes Biagio Borg, MD  BAYER MICROLET LANCETS lancets Use to help check blood sugar daily Dx E11.9 04/20/15   Biagio Borg, MD  Blood Glucose Monitoring Suppl (BAYER CONTOUR NEXT MONITOR) w/Device KIT Use as directed to check blood sugars daily Dx e11.9 04/20/15   Biagio Borg, MD   glucose blood test strip Use as instructed twice a day E 11.9 Contour Next Strips 03/21/18   Biagio Borg, MD  ketoconazole (NIZORAL) 2 % shampoo APPLY TO SCALP AND FACE AND SHAMPOO TWICE WEEKLY 03/23/17   [provider]  solifenacin (VESICARE) 5 MG tablet Take 1 tablet (5 mg total) by mouth daily. 11/12/18   Biagio Borg, MD  testosterone cypionate (DEPOTESTOSTERONE CYPIONATE) 200 MG/ML injection Inject 1 mL (200 mg total) into the muscle every 14 (fourteen) days. 01/27/19   Biagio Borg, MD  Vitamin D, Ergocalciferol, (DRISDOL) 1.25 MG (50000 UT) CAPS capsule Take 1 capsule (50,000 Units total) by mouth every 7 (seven) days. 11/12/18   Biagio Borg, MD    Family History Family History  Problem Relation Age of Onset  . Hyperlipidemia Mother   . Hypertension Mother   . Other Mother        Inflammatory Occipital Lymphadenitis  . Hypertension Father   . Hyperlipidemia Father   . Diabetes Maternal Grandmother   . Heart attack Maternal Grandmother 60  . Diabetes Paternal Grandmother   . Heart attack Paternal Grandmother 67  . Cirrhosis Brother        autoimmune hepatitis (twin)  . Colon cancer Paternal Grandfather     Social History Social History   Tobacco Use  . Smoking status: Never Smoker  . Smokeless tobacco: Never Used  Substance Use Topics  . Alcohol use: No  . Drug use: No     Allergies   Patient has no known allergies.   Review of Systems Review of Systems   Physical Exam Triage Vital Signs ED Triage Vitals  Enc Vitals Group     BP 04/05/19 1132 (!) 139/93     Pulse Rate 04/05/19 1132 (!) 106     Resp 04/05/19 1132 18     Temp 04/05/19 1132 98.7 F (37.1 C)     Temp Source 04/05/19 1132 Oral     SpO2 04/05/19 1132 96 %     Weight --      Height --      Head Circumference --      Peak Flow --      Pain Score 04/05/19 1128 3     Pain Loc --      Pain Edu? --      Excl. in [place]? --    No data found.  Updated Vital Signs BP (!) 139/93  (BP Location: Right Arm)   Pulse (!) 106   Temp 98.7 F (37.1 C) (Oral)   Resp 18   SpO2 96%    Physical Exam Constitutional:      General: He is not in acute distress.    Appearance: He is well-developed. He is not ill-appearing or toxic-appearing.  HENT:     Head: Normocephalic and atraumatic.  Comments: No palpable or visible vasculature to temples and non tender on palpation to temples  Eyes:     Extraocular Movements: Extraocular movements intact.     Right eye: No nystagmus.     Left eye: No nystagmus.     Pupils: Pupils are equal, round, and reactive to light. Pupils are equal.  Cardiovascular:     Rate and Rhythm: Normal rate.  Pulmonary:     Effort: Pulmonary effort is normal.  Skin:    General: Skin is warm and dry.  Neurological:     Mental Status: He is alert and oriented to person, place, and time. Mental status is at baseline.     Cranial Nerves: No cranial nerve deficit or facial asymmetry.     Sensory: No sensory deficit.  Psychiatric:        Mood and Affect: Mood normal.        Speech: Speech normal.      UC Treatments / Results  Labs (all labs ordered are listed, but only abnormal results are displayed) Labs Reviewed  SEDIMENTATION RATE  C-REACTIVE PROTEIN    EKG   Radiology No results found.  Procedures Procedures (including critical care time)  Medications Ordered in UC Medications  ketorolac (TORADOL) injection 60 mg (60 mg Intramuscular Given 04/05/19 1206)    Initial Impression / Assessment and Plan / UC Course  I have reviewed the triage vital signs and the nursing notes.  Pertinent labs & imaging results that were available during my care of the patient were reviewed by me and considered in my medical decision making (see chart for details).     No acute findings on exam. Reassuring negative MRI recently. Anxiety may be contributing, patient endorses increased stress. ESR/ CRP collected as patient is concerned and anxious  about temporal arteritis. Exam is not consistent with this today. At time of this note CRP is normal, sed rate still pending. Will notify patient if this is elevated. toradol provided here today for acute relief. Encouraged continued follow up with his PCP. Return precautions provided. Patient verbalized understanding and agreeable to plan. Ambulatory out of clinic without difficulty.    Final Clinical Impressions(s) / UC Diagnoses   Final diagnoses:  Nonintractable headache, unspecified chronicity pattern, unspecified headache type     Discharge Instructions     Your exam is very reassuring today. I have low suspicion for temporal arteritis. We are running a few testing to further evaluate this. Would call you if these are concerning.  You may use tylenol or Excedrin (not both) as needed for headache.  Please follow up with your primary care provider for recheck in the next two weeks to aid in further management as needed.  Return or go to the ER for any worsening of symptoms, vision changes, dizziness, fevers, significant increase in headache or otherwise worsening.      ED Prescriptions    None     PDMP not reviewed this encounter.   Zigmund Gottron, NP 04/05/19 1411

## 2019-04-05 NOTE — Discharge Instructions (Signed)
Your exam is very reassuring today. I have low suspicion for temporal arteritis. We are running a few testing to further evaluate this. Would call you if these are concerning.  You may use tylenol or Excedrin (not both) as needed for headache.  Please follow up with your primary care provider for recheck in the next two weeks to aid in further management as needed.  Return or go to the ER for any worsening of symptoms, vision changes, dizziness, fevers, significant increase in headache or otherwise worsening.

## 2019-04-05 NOTE — ED Triage Notes (Signed)
Pt c/o recurrent HA since December. This episode pain is located on right temple area; onset approx 4-5 days ago. States saw PMD who obtained MRI of head and informed pt that it WNL.   Denies n/v, changes in vision, dizziness, nasal congestion. Pt here today b/c he "wants to know what's causing it and how can I treat it".  Pt reports tylenol was recommended by his PMD but it is not helping the pain.   Pt states at times head with hurt in facial area; left/right temple areas in past.  Pt states his own speculation and concern for possible temporal arteritis.

## 2019-04-14 ENCOUNTER — Encounter: Payer: Self-pay | Admitting: Internal Medicine

## 2019-04-23 ENCOUNTER — Other Ambulatory Visit: Payer: Self-pay | Admitting: Internal Medicine

## 2019-04-23 ENCOUNTER — Telehealth: Payer: Self-pay

## 2019-04-23 DIAGNOSIS — F4321 Adjustment disorder with depressed mood: Secondary | ICD-10-CM | POA: Diagnosis not present

## 2019-04-23 NOTE — Telephone Encounter (Signed)
Filled today by Dr. Jonny Ruiz.

## 2019-04-23 NOTE — Telephone Encounter (Signed)
1.Medication Requested:EFFEXOR XR 150 MG 24 hr capsule  Patient is asking for name brand not the genertric   2. Pharmacy (Name, Street, City):[id number] - [place], [place] - [address]  3. On Med List: Yes   4. Last Visit with PCP: 1.28.21   5. Next visit date with PCP: 4.14.2021    Agent: Please be advised that RX refills may take up to 3 business days. We ask that you follow-up with your pharmacy.

## 2019-05-02 ENCOUNTER — Other Ambulatory Visit: Payer: Self-pay | Admitting: Internal Medicine

## 2019-05-04 NOTE — Telephone Encounter (Signed)
Please refill as per office routine med refill policy (all routine meds refilled for 3 mo or monthly per pt preference up to one year from last visit, then month to month grace period for 3 mo, then further med refills will have to be denied)  

## 2019-05-11 ENCOUNTER — Encounter: Payer: Self-pay | Admitting: Internal Medicine

## 2019-05-13 ENCOUNTER — Ambulatory Visit: Payer: BC Managed Care – PPO | Admitting: Internal Medicine

## 2019-05-20 ENCOUNTER — Encounter: Payer: Self-pay | Admitting: *Deleted

## 2019-05-20 ENCOUNTER — Ambulatory Visit (INDEPENDENT_AMBULATORY_CARE_PROVIDER_SITE_OTHER): Payer: 59 | Admitting: *Deleted

## 2019-05-20 ENCOUNTER — Other Ambulatory Visit: Payer: Self-pay

## 2019-05-20 DIAGNOSIS — E291 Testicular hypofunction: Secondary | ICD-10-CM | POA: Diagnosis not present

## 2019-05-20 MED ORDER — TESTOSTERONE CYPIONATE 200 MG/ML IM SOLN
200.0000 mg | INTRAMUSCULAR | Status: DC
  Administered 2019-05-20: 200 mg via INTRAMUSCULAR

## 2019-05-20 NOTE — Progress Notes (Addendum)
Pls cosign for Testosterone Cypionate inj.Marland KitchenRaechel Chute  Medical screening examination/treatment/procedure(s) were performed by non-physician practitioner and as supervising physician I was immediately available for consultation/collaboration. I agree with above. Oliver Barre, MD

## 2019-05-25 ENCOUNTER — Encounter: Payer: Self-pay | Admitting: Internal Medicine

## 2019-05-25 MED ORDER — GLUCOSE BLOOD VI STRP: ORAL_STRIP | 2 refills | Status: DC

## 2019-06-06 ENCOUNTER — Emergency Department (HOSPITAL_COMMUNITY): Payer: 59

## 2019-06-06 ENCOUNTER — Encounter (HOSPITAL_COMMUNITY): Payer: Self-pay | Admitting: Emergency Medicine

## 2019-06-06 ENCOUNTER — Other Ambulatory Visit: Payer: Self-pay

## 2019-06-06 ENCOUNTER — Inpatient Hospital Stay (HOSPITAL_COMMUNITY)
Admission: EM | Admit: 2019-06-06 | Discharge: 2019-06-11 | DRG: 418 | Disposition: A | Discharge status: Home / Self Care | Payer: 59 | Attending: General Surgery | Admitting: General Surgery

## 2019-06-06 ENCOUNTER — Emergency Department (HOSPITAL_COMMUNITY)
Admission: EM | Admit: 2019-06-06 | Discharge: 2019-06-06 | Disposition: A | Discharge status: Home / Self Care | Payer: 59 | Source: Home / Self Care | Attending: Emergency Medicine | Admitting: Emergency Medicine

## 2019-06-06 DIAGNOSIS — E119 Type 2 diabetes mellitus without complications: Secondary | ICD-10-CM | POA: Diagnosis present

## 2019-06-06 DIAGNOSIS — K8012 Calculus of gallbladder with acute and chronic cholecystitis without obstruction: Principal | ICD-10-CM | POA: Diagnosis present

## 2019-06-06 DIAGNOSIS — G4733 Obstructive sleep apnea (adult) (pediatric): Secondary | ICD-10-CM | POA: Diagnosis present

## 2019-06-06 DIAGNOSIS — Z7982 Long term (current) use of aspirin: Secondary | ICD-10-CM

## 2019-06-06 DIAGNOSIS — K810 Acute cholecystitis: Secondary | ICD-10-CM | POA: Diagnosis not present

## 2019-06-06 DIAGNOSIS — Z79899 Other long term (current) drug therapy: Secondary | ICD-10-CM

## 2019-06-06 DIAGNOSIS — Z7984 Long term (current) use of oral hypoglycemic drugs: Secondary | ICD-10-CM

## 2019-06-06 DIAGNOSIS — R1011 Right upper quadrant pain: Secondary | ICD-10-CM

## 2019-06-06 DIAGNOSIS — Z9049 Acquired absence of other specified parts of digestive tract: Secondary | ICD-10-CM

## 2019-06-06 DIAGNOSIS — F419 Anxiety disorder, unspecified: Secondary | ICD-10-CM | POA: Diagnosis present

## 2019-06-06 DIAGNOSIS — K82A1 Gangrene of gallbladder in cholecystitis: Secondary | ICD-10-CM | POA: Diagnosis present

## 2019-06-06 DIAGNOSIS — J9811 Atelectasis: Secondary | ICD-10-CM | POA: Diagnosis not present

## 2019-06-06 DIAGNOSIS — K8020 Calculus of gallbladder without cholecystitis without obstruction: Secondary | ICD-10-CM

## 2019-06-06 DIAGNOSIS — Z20822 Contact with and (suspected) exposure to covid-19: Secondary | ICD-10-CM | POA: Diagnosis present

## 2019-06-06 DIAGNOSIS — I10 Essential (primary) hypertension: Secondary | ICD-10-CM | POA: Diagnosis present

## 2019-06-06 DIAGNOSIS — Z6841 Body Mass Index (BMI) 40.0 and over, adult: Secondary | ICD-10-CM

## 2019-06-06 DIAGNOSIS — K819 Cholecystitis, unspecified: Secondary | ICD-10-CM | POA: Diagnosis present

## 2019-06-06 DIAGNOSIS — R1013 Epigastric pain: Secondary | ICD-10-CM

## 2019-06-06 LAB — BASIC METABOLIC PANEL
Anion gap: 14 (ref 5–15)
BUN: 13 mg/dL (ref 6–20)
CO2: 26 mmol/L (ref 22–32)
Calcium: 9.4 mg/dL (ref 8.9–10.3)
Chloride: 98 mmol/L (ref 98–111)
Creatinine, Ser: 1.06 mg/dL (ref 0.61–1.24)
GFR calc Af Amer: >60 mL/min (ref >60)
GFR calc non Af Amer: >60 mL/min (ref >60)
Glucose, Bld: 227 mg/dL — ABNORMAL HIGH (ref 70–99)
Potassium: 3.6 mmol/L (ref 3.5–5.1)
Sodium: 138 mmol/L (ref 135–145)

## 2019-06-06 LAB — CBC
HCT: 47.7 % (ref 39.0–52.0)
HCT: 48.1 % (ref 39.0–52.0)
Hemoglobin: 15.1 g/dL (ref 13.0–17.0)
Hemoglobin: 15 g/dL (ref 13.0–17.0)
MCH: 30.3 pg (ref 26.0–34.0)
MCH: 29.4 pg (ref 26.0–34.0)
MCHC: 31.7 g/dL (ref 30.0–36.0)
MCHC: 31.2 g/dL (ref 30.0–36.0)
MCV: 95.6 fL (ref 80.0–100.0)
MCV: 94.3 fL (ref 80.0–100.0)
Platelets: 318 10*3/uL (ref 150–400)
Platelets: 305 10*3/uL (ref 150–400)
RBC: 4.99 MIL/uL (ref 4.22–5.81)
RBC: 5.1 MIL/uL (ref 4.22–5.81)
RDW: 12.7 % (ref 11.5–15.5)
RDW: 12.6 % (ref 11.5–15.5)
WBC: 16.6 10*3/uL — ABNORMAL HIGH (ref 4.0–10.5)
WBC: 23.3 10*3/uL — ABNORMAL HIGH (ref 4.0–10.5)
nRBC: 0 % (ref 0.0–0.2)
nRBC: 0 % (ref 0.0–0.2)

## 2019-06-06 LAB — HEPATIC FUNCTION PANEL
ALT: 19 U/L (ref 0–44)
AST: 19 U/L (ref 15–41)
Albumin: 3.7 g/dL (ref 3.5–5.0)
Alkaline Phosphatase: 56 U/L (ref 38–126)
Bilirubin, Direct: 0.1 mg/dL (ref 0.0–0.2)
Indirect Bilirubin: 0.5 mg/dL (ref 0.3–0.9)
Total Bilirubin: 0.6 mg/dL (ref 0.3–1.2)
Total Protein: 7.5 g/dL (ref 6.5–8.1)

## 2019-06-06 LAB — COMPREHENSIVE METABOLIC PANEL
ALT: 19 U/L (ref 0–44)
AST: 19 U/L (ref 15–41)
Albumin: 3.7 g/dL (ref 3.5–5.0)
Alkaline Phosphatase: 63 U/L (ref 38–126)
Anion gap: 14 (ref 5–15)
BUN: 10 mg/dL (ref 6–20)
CO2: 24 mmol/L (ref 22–32)
Calcium: 9.4 mg/dL (ref 8.9–10.3)
Chloride: 97 mmol/L — ABNORMAL LOW (ref 98–111)
Creatinine, Ser: 0.89 mg/dL (ref 0.61–1.24)
GFR calc Af Amer: >60 mL/min (ref >60)
GFR calc non Af Amer: >60 mL/min (ref >60)
Glucose, Bld: 245 mg/dL — ABNORMAL HIGH (ref 70–99)
Potassium: 3.9 mmol/L (ref 3.5–5.1)
Sodium: 135 mmol/L (ref 135–145)
Total Bilirubin: 1.2 mg/dL (ref 0.3–1.2)
Total Protein: 8 g/dL (ref 6.5–8.1)

## 2019-06-06 LAB — TROPONIN I (HIGH SENSITIVITY)
Troponin I (High Sensitivity): 4 ng/L
Troponin I (High Sensitivity): 3 ng/L
Troponin I (High Sensitivity): 4 ng/L

## 2019-06-06 LAB — LIPASE, BLOOD
Lipase: 29 U/L (ref 11–51)
Lipase: 37 U/L (ref 11–51)

## 2019-06-06 MED ORDER — SODIUM CHLORIDE 0.9% FLUSH: 3.0000 mL | Freq: Once | INTRAVENOUS | Status: DC

## 2019-06-06 MED ORDER — ALUM & MAG HYDROXIDE-SIMETH 200-200-20 MG/5ML PO SUSP
30.0000 mL | Freq: Once | ORAL | Status: AC
  Administered 2019-06-06: 30 mL via ORAL
  Filled 2019-06-06: qty 30

## 2019-06-06 MED ORDER — LIDOCAINE VISCOUS HCL 2 % MT SOLN
15.0000 mL | Freq: Once | OROMUCOSAL | Status: AC
  Administered 2019-06-06: 15 mL via ORAL
  Filled 2019-06-06: qty 15

## 2019-06-06 MED ORDER — IOHEXOL 300 MG/ML  SOLN
125.0000 mL | Freq: Once | INTRAMUSCULAR | Status: AC | PRN
  Administered 2019-06-06: 125 mL via INTRAVENOUS

## 2019-06-06 MED ORDER — ONDANSETRON 4 MG PO TBDP: 4.0000 mg | ORAL_TABLET | Freq: Three times a day (TID) | ORAL | 0 refills | Status: DC | PRN

## 2019-06-06 MED ORDER — SUCRALFATE 1 G PO TABS: 1.0000 g | ORAL_TABLET | Freq: Three times a day (TID) | ORAL | 0 refills | Status: DC

## 2019-06-06 NOTE — ED Provider Notes (Signed)
[facility]PART[facility] Provider Note   CSN: [id number] Arrival date & time: 06/06/19  0156     History Chief Complaint  Patient presents with  . Chest Pain    Gregory Owens is a 49 y.o. male.  History of diabetes, hypertension, sleep apnea presenting with epigastric pain is similar to previous episodes of GERD.  States this started after eating Gregory Owens last night.  Pain onset about 8 PM and was constant until he received a GI cocktail in triage about 4 AM.  Reports his pain is much improved now.  The pain was sharp and burning in his epigastrium with no radiation.  Associated with about 3 or 4 episodes of nonbloody nonbilious emesis.  States he had pain and trouble breathing because of the pain.  No cough or fever.  Pain is similar to previous episodes of GERD for which she takes Protonix as well as Pepcid.  He denies any black or bloody stools.  Still has appendix and gallbladder.  Has never had an EGD.  Reports this feels similar to previous episodes of GERD but did not improve.  He did get relief with a GI cocktail in triage.  Denies any cardiac history is never had a heart attack or any stents in his heart.  Denies any radiation of the pain to his neck or back.  The history is provided by the patient.       Past Medical History:  Diagnosis Date  . Abnormal EKG    NS ST-T EKG changes with negative Stress cardiolite 2003  . Anxiety   . Dehydration 2005   [place] , [place]  . Diabetes mellitus without complication ([place])   . Hyperglycemia   . Hyperlipidemia   . IBS (irritable bowel syndrome)   . OSA (obstructive sleep apnea)    resolved post surgery    Patient Active Problem List   Diagnosis Date Noted  . Anxiety 02/26/2019  . Headache 02/08/2019  . Urinary urgency 11/12/2018  . Possible exposure to STD 10/16/2017  . Hypogonadism in male 09/04/2017  . Bilateral foot pain 03/08/2017  . Dysphagia 08/29/2016  . ADHD 03/28/2016  . Morbid  obesity ([place]) 12/06/2015  . Preventative health care 03/10/2015  . Skin lesion 03/10/2015  . GERD (gastroesophageal reflux disease) 11/04/2013  . Tachycardia 11/04/2013  . Nonspecific abnormal electrocardiogram (ECG) (EKG) 11/04/2013  . Non-compliant behavior 10/14/2013  . Uncontrolled diabetes mellitus ([place]) 08/19/2012  . PLMD (periodic limb movement disorder) 05/30/2012  . SLEEP DISORDER 12/05/2009  . Hyperlipidemia 08/25/2009  . GAD (generalized anxiety disorder) 08/25/2009  . Obstructive sleep apnea 08/25/2009  . RHINITIS 05/24/2009    Past Surgical History:  Procedure Laterality Date  . TONSILLECTOMY AND ADENOIDECTOMY      with above  . UVULOPALATOPHARYNGOPLASTY  2004   Post op bleeding complication;  Dr Janace Hoard  . WISDOM TOOTH EXTRACTION         Family History  Problem Relation Age of Onset  . Hyperlipidemia Mother   . Hypertension Mother   . Other Mother        Inflammatory Occipital Lymphadenitis  . Hypertension Father   . Hyperlipidemia Father   . Diabetes Maternal Grandmother   . Heart attack Maternal Grandmother 60  . Diabetes Paternal Grandmother   . Heart attack Paternal Grandmother 17  . Cirrhosis Brother        autoimmune hepatitis (twin)  . Colon cancer Paternal Grandfather     Social History   Tobacco  Use  . Smoking status: Never Smoker  . Smokeless tobacco: Never Used  Substance Use Topics  . Alcohol use: No  . Drug use: No    Home Medications Prior to Admission medications   Medication Sig Start Date End Date Taking? Authorizing Provider  aspirin 81 MG EC tablet TAKE 1 TABLET (81 MG TOTAL) BY MOUTH DAILY. 03/25/17   Biagio Borg, MD  BAYER MICROLET LANCETS lancets Use to help check blood sugar daily Dx E11.9 04/20/15   Biagio Borg, MD  Blood Glucose Monitoring Suppl (BAYER CONTOUR NEXT MONITOR) w/Device KIT Use as directed to check blood sugars daily Dx e11.9 04/20/15   Biagio Borg, MD  cetirizine (ZYRTEC) 10 MG tablet Take 10 mg by  mouth daily.      [provider]  EFFEXOR XR 150 MG 24 hr capsule TAKE 1 CAPSULE BY MOUTH EVERY DAY 04/23/19   Biagio Borg, MD  famotidine (PEPCID) 40 MG tablet Take 1 tablet (40 mg total) by mouth at bedtime. 05/19/18   Biagio Borg, MD  glucose blood test strip Use as instructed twice a day E 11.9 Contour Next Strips 05/25/19   Biagio Borg, MD  ketoconazole (NIZORAL) 2 % shampoo APPLY TO SCALP AND FACE AND SHAMPOO TWICE WEEKLY 03/23/17   [provider]  losartan (COZAAR) 25 MG tablet Take 1 tablet by mouth once daily 05/05/19   Biagio Borg, MD  metFORMIN (GLUCOPHAGE-XR) 500 MG 24 hr tablet TAKE 4 TABLETS BY MOUTH ONCE DAILY WITH BREAKFAST 11/12/18   Biagio Borg, MD  pantoprazole (PROTONIX) 40 MG tablet Take 1 tablet (40 mg total) by mouth daily. 03/28/16   Biagio Borg, MD  Probiotic Product (ALIGN PO) Take by mouth.    [provider]  rosuvastatin (CRESTOR) 10 MG tablet Take 1 tablet (10 mg total) by mouth daily. 05/19/18   Biagio Borg, MD  solifenacin (VESICARE) 5 MG tablet Take 1 tablet (5 mg total) by mouth daily. 11/12/18   Biagio Borg, MD  testosterone cypionate (DEPOTESTOSTERONE CYPIONATE) 200 MG/ML injection Inject 1 mL (200 mg total) into the muscle every 14 (fourteen) days. 01/27/19   Biagio Borg, MD  Vitamin D, Ergocalciferol, (DRISDOL) 1.25 MG (50000 UT) CAPS capsule Take 1 capsule (50,000 Units total) by mouth every 7 (seven) days. 11/12/18   Biagio Borg, MD    Allergies    Patient has no known allergies.  Review of Systems   Review of Systems  Constitutional: Negative for activity change, appetite change, fatigue and fever.  HENT: Negative for congestion and rhinorrhea.   Eyes: Negative for visual disturbance.  Respiratory: Negative for cough, chest tightness and shortness of breath.   Gastrointestinal: Positive for abdominal pain, nausea and vomiting. Negative for blood in stool, constipation and diarrhea.  Genitourinary: Negative for  dysuria and hematuria.  Musculoskeletal: Negative for arthralgias and joint swelling.  Skin: Negative for rash.  Neurological: Negative for dizziness, weakness and headaches.    all other systems are negative except as noted in the HPI and PMH.   Physical Exam Updated Vital Signs BP (!) 166/99 (BP Location: Right Arm)   Pulse 82   Temp 98 F (36.7 C) (Oral)   Resp 18   SpO2 97%   Physical Exam Vitals and nursing note reviewed.  Constitutional:      General: He is not in acute distress.    Appearance: He is well-developed. He is obese.  HENT:  Head: Normocephalic and atraumatic.     Mouth/Throat:     Pharynx: No oropharyngeal exudate.  Eyes:     Conjunctiva/sclera: Conjunctivae normal.     Pupils: Pupils are equal, round, and reactive to light.  Neck:     Comments: No meningismus. Cardiovascular:     Rate and Rhythm: Normal rate and regular rhythm.     Heart sounds: Normal heart sounds. No murmur.  Pulmonary:     Effort: Pulmonary effort is normal. No respiratory distress.     Breath sounds: Normal breath sounds.  Abdominal:     Palpations: Abdomen is soft.     Tenderness: There is abdominal tenderness. There is no guarding or rebound.     Comments: Epigastric and xiphoid tenderness, no guarding or rebound  Musculoskeletal:        General: No tenderness. Normal range of motion.     Cervical back: Normal range of motion and neck supple.  Skin:    General: Skin is warm.  Neurological:     Mental Status: He is alert and oriented to person, place, and time.     Cranial Nerves: No cranial nerve deficit.     Motor: No abnormal muscle tone.     Coordination: Coordination normal.     Comments: No ataxia on finger to nose bilaterally. No pronator drift. 5/5 strength throughout. CN 2-12 intact.Equal grip strength. Sensation intact.   Psychiatric:        Behavior: Behavior normal.     ED Results / Procedures / Treatments   Labs (all labs ordered are listed, but only  abnormal results are displayed) Labs Reviewed  BASIC METABOLIC PANEL - Abnormal; Notable for the following components:      Result Value   Glucose, Bld 227 (*)    All other components within normal limits  CBC - Abnormal; Notable for the following components:   WBC 16.6 (*)    All other components within normal limits  LIPASE, BLOOD  HEPATIC FUNCTION PANEL  TROPONIN I (HIGH SENSITIVITY)  TROPONIN I (HIGH SENSITIVITY)  TROPONIN I (HIGH SENSITIVITY)    EKG EKG Interpretation  Date/Time:  Saturday Jun 06 2019 02:00:26 EDT Ventricular Rate:  89 PR Interval:  126 QRS Duration: 72 QT Interval:  368 QTC Calculation: 447 R Axis:   66 Text Interpretation: Normal sinus rhythm Nonspecific T wave abnormality Abnormal ECG No significant change was found Confirmed by Ezequiel Essex [phone]) on 06/06/2019 5:50:31 AM   Radiology DG Chest 2 View  Result Date: 06/06/2019 CLINICAL DATA:  Centralized chest pain EXAM: CHEST - 2 VIEW COMPARISON:  08/15/2017 FINDINGS: The heart size and mediastinal contours are within normal limits. Both lungs are clear. The visualized skeletal structures are unremarkable. IMPRESSION: No active cardiopulmonary disease. Electronically Signed   By: Randa Ngo M.D.   On: 06/06/2019 02:32    Procedures Procedures (including critical care time)  Medications Ordered in ED Medications  sodium chloride flush (NS) 0.9 % injection 3 mL (has no administration in time range)  alum & mag hydroxide-simeth (MAALOX/MYLANTA) 200-200-20 MG/5ML suspension 30 mL (30 mLs Oral Given 06/06/19 0406)    And  lidocaine (XYLOCAINE) 2 % viscous mouth solution 15 mL (15 mLs Oral Given 06/06/19 0406)    ED Course  I have reviewed the triage vital signs and the nursing notes.  Pertinent labs & imaging results that were available during my care of the patient were reviewed by me and considered in my medical decision making (see chart for  details).    MDM Rules/Calculators/A&P                        Epigastric pain with nausea and vomiting similar to previous episodes of GERD.  EKG is unchanged without acute ST changes.  Abdomen soft without peritoneal signs.  Troponin negative.  Leukocytosis noted.  LFTs and lipase are normal.  Feels improved after receiving GI cocktail in triage.  Low suspicion for ACS.  Has had ongoing pain for about the past 8 hours with negative troponin.  Pain is not exertional or pleuritic.  No tachycardia or hypoxia.  Low suspicion for PE.  Patient will obtain ultrasound given his leukocytosis to assess his gallbladder.  Suspect this may be gastritis or esophagitis.  Continue PPI and H2 blocker.  Ultrasound pending at shift change as well as LFTs.  Anticipate discharge home with Carafate.  Avoid alcohol, caffeine, spicy foods.  Follow-up with PCP as well as gastroenterology.  Return to the ED if chest pain, exertional, associated shortness of breath, nausea, vomiting or diaphoresis.  Dr. Tamera Punt to assume care at shift change Final Clinical Impression(s) / ED Diagnoses Final diagnoses:  RUQ pain  Epigastric pain    Rx / DC Orders ED Discharge Orders    None       Katrin Grabel, Annie Main, MD 06/06/19 [phone]

## 2019-06-06 NOTE — ED Triage Notes (Signed)
Pt c/o epigastric pain onset 2000 last night, emesis 3-4 x after.

## 2019-06-06 NOTE — ED Notes (Signed)
Got patient into a gown on the monitor got patient a warm blanket and call bell is in reach

## 2019-06-06 NOTE — ED Provider Notes (Signed)
[facility] EMERGENCY DEPARTMENT Provider Note   CSN: [id number] Arrival date & time: 06/06/19  2128     History Chief Complaint  Patient presents with  . Chest Pain  . Abdominal Pain    Gregory Owens is a 49 y.o. male.   Chest Pain Pain location:  Epigastric Pain quality: dull   Pain radiates to:  Does not radiate Pain severity:  Severe Onset quality:  Gradual Duration:  1 day Timing:  Constant Progression:  Worsening Chronicity:  Recurrent Context: not lifting   Relieved by:  Nothing Worsened by:  Nothing Ineffective treatments:  None tried Associated symptoms: abdominal pain and heartburn   Associated symptoms: no anorexia, no anxiety, no back pain, no claudication, no cough, no diaphoresis, no fever, no headache, no lower extremity edema, no near-syncope and no shortness of breath   Risk factors: no aortic disease and no diabetes mellitus   Abdominal Pain Pain location:  Epigastric Pain quality: dull   Pain radiates to:  RUQ Pain severity:  Severe Onset quality:  Gradual Timing:  Intermittent Progression:  Worsening Chronicity:  New Context: not alcohol use   Relieved by:  Antacids Worsened by:  Nothing Ineffective treatments:  None tried Associated symptoms: no anorexia, no constipation, no cough, no dysuria, no fever and no shortness of breath   Risk factors: has not had multiple surgeries   Seen on shift last night with elevated white count.  Patient had US of the GB showing stones but no acute findings felt improved with GI cocktail in the ED and was discharged to home and the symptoms returned.  No f/c/r.  No exertional symptoms.       Past Medical History:  Diagnosis Date  . Abnormal EKG    NS ST-T EKG changes with negative Stress cardiolite 2003  . Anxiety   . Dehydration 2005   [place] , [place]  . Diabetes mellitus without complication ([place])   . Hyperglycemia   . Hyperlipidemia   . IBS (irritable bowel syndrome)   .  OSA (obstructive sleep apnea)    resolved post surgery    Patient Active Problem List   Diagnosis Date Noted  . Anxiety 02/26/2019  . Headache 02/08/2019  . Urinary urgency 11/12/2018  . Possible exposure to STD 10/16/2017  . Hypogonadism in male 09/04/2017  . Bilateral foot pain 03/08/2017  . Dysphagia 08/29/2016  . ADHD 03/28/2016  . Morbid obesity ([place]) 12/06/2015  . Preventative health care 03/10/2015  . Skin lesion 03/10/2015  . GERD (gastroesophageal reflux disease) 11/04/2013  . Tachycardia 11/04/2013  . Nonspecific abnormal electrocardiogram (ECG) (EKG) 11/04/2013  . Non-compliant behavior 10/14/2013  . Uncontrolled diabetes mellitus ([place]) 08/19/2012  . PLMD (periodic limb movement disorder) 05/30/2012  . SLEEP DISORDER 12/05/2009  . Hyperlipidemia 08/25/2009  . GAD (generalized anxiety disorder) 08/25/2009  . Obstructive sleep apnea 08/25/2009  . RHINITIS 05/24/2009    Past Surgical History:  Procedure Laterality Date  . TONSILLECTOMY AND ADENOIDECTOMY      with above  . UVULOPALATOPHARYNGOPLASTY  2004   Post op bleeding complication;  Dr Janace Hoard  . WISDOM TOOTH EXTRACTION         Family History  Problem Relation Age of Onset  . Hyperlipidemia Mother   . Hypertension Mother   . Other Mother        Inflammatory Occipital Lymphadenitis  . Hypertension Father   . Hyperlipidemia Father   . Diabetes Maternal Grandmother   . Heart attack Maternal Grandmother 60  .  Diabetes Paternal Grandmother   . Heart attack Paternal Grandmother 66  . Cirrhosis Brother        autoimmune hepatitis (twin)  . Colon cancer Paternal Grandfather     Social History   Tobacco Use  . Smoking status: Never Smoker  . Smokeless tobacco: Never Used  Substance Use Topics  . Alcohol use: No  . Drug use: No    Home Medications Prior to Admission medications   Medication Sig Start Date End Date Taking? Authorizing Provider  aspirin 81 MG EC tablet TAKE 1 TABLET (81 MG TOTAL)  BY MOUTH DAILY. 03/25/17   Biagio Borg, MD  BAYER MICROLET LANCETS lancets Use to help check blood sugar daily Dx E11.9 04/20/15   Biagio Borg, MD  Blood Glucose Monitoring Suppl (BAYER CONTOUR NEXT MONITOR) w/Device KIT Use as directed to check blood sugars daily Dx e11.9 04/20/15   Biagio Borg, MD  cetirizine (ZYRTEC) 10 MG tablet Take 10 mg by mouth daily.      [provider]  EFFEXOR XR 150 MG 24 hr capsule TAKE 1 CAPSULE BY MOUTH EVERY DAY Patient taking differently: Take 150 mg by mouth daily.  04/23/19   Biagio Borg, MD  famotidine (PEPCID) 40 MG tablet Take 1 tablet (40 mg total) by mouth at bedtime. 05/19/18   Biagio Borg, MD  glucose blood test strip Use as instructed twice a day E 11.9 Contour Next Strips 05/25/19   Biagio Borg, MD  ketoconazole (NIZORAL) 2 % shampoo Apply 1 application topically 2 (two) times a week.  03/23/17   [provider]  losartan (COZAAR) 25 MG tablet Take 1 tablet by mouth once daily Patient taking differently: Take 25 mg by mouth daily.  05/05/19   Biagio Borg, MD  metFORMIN (GLUCOPHAGE-XR) 500 MG 24 hr tablet TAKE 4 TABLETS BY MOUTH ONCE DAILY WITH BREAKFAST Patient taking differently: Take 1,000 mg by mouth in the morning and at bedtime.  11/12/18   Biagio Borg, MD  ondansetron (ZOFRAN ODT) 4 MG disintegrating tablet Take 1 tablet (4 mg total) by mouth every 8 (eight) hours as needed for nausea or vomiting. 06/06/19   Rancour, Annie Main, MD  pantoprazole (PROTONIX) 40 MG tablet Take 1 tablet (40 mg total) by mouth daily. 03/28/16   Biagio Borg, MD  Probiotic Product (ALIGN PO) Take 1 capsule by mouth daily.     [provider]  rosuvastatin (CRESTOR) 10 MG tablet Take 1 tablet (10 mg total) by mouth daily. 05/19/18   Biagio Borg, MD  solifenacin (VESICARE) 5 MG tablet Take 1 tablet (5 mg total) by mouth daily. 11/12/18   Biagio Borg, MD  sucralfate (CARAFATE) 1 g tablet Take 1 tablet (1 g total) by mouth 4 (four) times  daily -  with meals and at bedtime. 06/06/19   Rancour, Annie Main, MD  testosterone cypionate (DEPOTESTOSTERONE CYPIONATE) 200 MG/ML injection Inject 1 mL (200 mg total) into the muscle every 14 (fourteen) days. 01/27/19   Biagio Borg, MD  Vitamin D, Ergocalciferol, (DRISDOL) 1.25 MG (50000 UT) CAPS capsule Take 1 capsule (50,000 Units total) by mouth every 7 (seven) days. Patient not taking: Reported on 06/06/2019 11/12/18   Biagio Borg, MD    Allergies    Patient has no known allergies.  Review of Systems   Review of Systems  Constitutional: Negative for diaphoresis and fever.  HENT: Negative for congestion.   Eyes: Negative for visual disturbance.  Respiratory: Negative for cough and shortness of breath.   Cardiovascular: Negative for claudication and near-syncope.  Gastrointestinal: Positive for abdominal pain and heartburn. Negative for anorexia and constipation.  Genitourinary: Negative for dysuria.  Musculoskeletal: Negative for back pain.  Neurological: Negative for headaches.  Psychiatric/Behavioral: Negative for agitation.  All other systems reviewed and are negative.   Physical Exam Updated Vital Signs There were no vitals taken for this visit.  Physical Exam Vitals and nursing note reviewed.  Constitutional:      General: He is not in acute distress.    Appearance: Normal appearance.  HENT:     Head: Normocephalic and atraumatic.     Nose: Nose normal.     Mouth/Throat:     Mouth: Mucous membranes are moist.     Pharynx: Oropharynx is clear.  Eyes:     Conjunctiva/sclera: Conjunctivae normal.     Pupils: Pupils are equal, round, and reactive to light.  Cardiovascular:     Rate and Rhythm: Normal rate and regular rhythm.     Pulses: Normal pulses.     Heart sounds: Normal heart sounds.  Pulmonary:     Effort: Pulmonary effort is normal.     Breath sounds: Normal breath sounds.  Abdominal:     General: Abdomen is flat. Bowel sounds are normal.      Tenderness: There is no guarding or rebound.  Musculoskeletal:        General: Normal range of motion.     Cervical back: Normal range of motion and neck supple.  Skin:    General: Skin is warm and dry.     Capillary Refill: Capillary refill takes less than 2 seconds.  Neurological:     General: No focal deficit present.     Mental Status: He is alert and oriented to person, place, and time.     Deep Tendon Reflexes: Reflexes normal.  Psychiatric:        Mood and Affect: Mood normal.        Behavior: Behavior normal.     ED Results / Procedures / Treatments   Labs (all labs ordered are listed, but only abnormal results are displayed) Results for orders placed or performed during the hospital encounter of 06/06/19  Respiratory Panel by RT PCR (Flu A&B, Covid) - Nasopharyngeal Swab   Specimen: Nasopharyngeal Swab  Result Value Ref Range   SARS Coronavirus 2 by RT PCR NEGATIVE NEGATIVE   Influenza A by PCR NEGATIVE NEGATIVE   Influenza B by PCR NEGATIVE NEGATIVE  Lipase, blood  Result Value Ref Range   Lipase 37 11 - 51 U/L  Comprehensive metabolic panel  Result Value Ref Range   Sodium 135 135 - 145 mmol/L   Potassium 3.9 3.5 - 5.1 mmol/L   Chloride 97 (L) 98 - 111 mmol/L   CO2 24 22 - 32 mmol/L   Glucose, Bld 245 (H) 70 - 99 mg/dL   BUN 10 6 - 20 mg/dL   Creatinine, Ser 0.89 0.61 - 1.24 mg/dL   Calcium 9.4 8.9 - 10.3 mg/dL   Total Protein 8.0 6.5 - 8.1 g/dL   Albumin 3.7 3.5 - 5.0 g/dL   AST 19 15 - 41 U/L   ALT 19 0 - 44 U/L   Alkaline Phosphatase 63 38 - 126 U/L   Total Bilirubin 1.2 0.3 - 1.2 mg/dL   GFR calc non Af Amer >60 >60 mL/min   GFR calc Af Amer >60 >60 mL/min   Anion gap  14 5 - 15  CBC  Result Value Ref Range   WBC 23.3 (H) 4.0 - 10.5 K/uL   RBC 5.10 4.22 - 5.81 MIL/uL   Hemoglobin 15.0 13.0 - 17.0 g/dL   HCT 48.1 39.0 - 52.0 %   MCV 94.3 80.0 - 100.0 fL   MCH 29.4 26.0 - 34.0 pg   MCHC 31.2 30.0 - 36.0 g/dL   RDW 12.6 11.5 - 15.5 %   Platelets  305 150 - 400 K/uL   nRBC 0.0 0.0 - 0.2 %  Urinalysis, Routine w reflex microscopic  Result Value Ref Range   Color, Urine YELLOW YELLOW   APPearance CLEAR CLEAR   Specific Gravity, Urine 1.035 (H) 1.005 - 1.030   pH 6.0 5.0 - 8.0   Glucose, UA >=500 (A) NEGATIVE mg/dL   Hgb urine dipstick NEGATIVE NEGATIVE   Bilirubin Urine NEGATIVE NEGATIVE   Ketones, ur 20 (A) NEGATIVE mg/dL   Protein, ur 30 (A) NEGATIVE mg/dL   Nitrite NEGATIVE NEGATIVE   Leukocytes,Ua NEGATIVE NEGATIVE   RBC / HPF 0-5 0 - 5 RBC/hpf   WBC, UA 0-5 0 - 5 WBC/hpf   Bacteria, UA NONE SEEN NONE SEEN   Mucus PRESENT    DG Chest 2 View  Result Date: 06/06/2019 CLINICAL DATA:  Centralized chest pain EXAM: CHEST - 2 VIEW COMPARISON:  08/15/2017 FINDINGS: The heart size and mediastinal contours are within normal limits. Both lungs are clear. The visualized skeletal structures are unremarkable. IMPRESSION: No active cardiopulmonary disease. Electronically Signed   By: Randa Ngo M.D.   On: 06/06/2019 02:32   CT ABDOMEN PELVIS W CONTRAST  Result Date: 06/07/2019 CLINICAL DATA:  49 year old male with right-sided abdominal pain. EXAM: CT ABDOMEN AND PELVIS WITH CONTRAST TECHNIQUE: Multidetector CT imaging of the abdomen and pelvis was performed using the standard protocol following bolus administration of intravenous contrast. CONTRAST:  12m OMNIPAQUE IOHEXOL 300 MG/ML  SOLN COMPARISON:  Abdominal ultrasound dated 06/06/2019. FINDINGS: Lower chest: Minimal bibasilar dependent atelectasis. The visualized lung bases are otherwise clear. No intra-abdominal free air or free fluid. Hepatobiliary: Apparent fatty infiltration of the liver. No intrahepatic biliary ductal dilatation. There is a small stone in the neck of the gallbladder. The gallbladder wall appears thickened and edematous most concerning for acute cholecystitis. Clinical correlation recommended. Repeat ultrasound or a HIDA scan may provide better evaluation. Pancreas:  Unremarkable. No pancreatic ductal dilatation or surrounding inflammatory changes. Spleen: Normal in size without focal abnormality. Adrenals/Urinary Tract: The adrenal glands are unremarkable. There is no hydronephrosis on either side. The visualized ureters appear unremarkable. The urinary bladder is collapsed. Stomach/Bowel: Sigmoid diverticulosis without active inflammatory changes. There is no bowel obstruction or active inflammation. The appendix is normal. Vascular/Lymphatic: The abdominal aorta and IVC are unremarkable. No portal venous gas. There is no adenopathy. Reproductive: The prostate and seminal vesicles are grossly unremarkable. No pelvic masses Other: Small fat containing umbilical hernia. Musculoskeletal: No acute or significant osseous findings. IMPRESSION: 1. Cholelithiasis with findings most concerning for acute cholecystitis. Clinical correlation recommended. Repeat ultrasound or a HIDA scan may provide better evaluation. 2. Fatty liver. 3. Sigmoid diverticulosis. No bowel obstruction. Normal appendix. Electronically Signed   By: AAnner CreteM.D.   On: 06/07/2019 00:16   [place]Abdomen Limited RUQ  Result Date: 06/06/2019 CLINICAL DATA:  Right upper quadrant pain. Additional history provided by scanning technologist: Right upper quadrant pain for 1 week. EXAM: ULTRASOUND ABDOMEN LIMITED RIGHT UPPER QUADRANT COMPARISON:  No pertinent prior studies  available for comparison. FINDINGS: Gallbladder: Cholecystolithiasis. No gallbladder wall thickening is visualized. No sonographic Percell Miller sign is elicited by the scanning technologist. Common bile duct: Diameter: 6 mm, within normal limits. Liver: No focal lesion identified. Heterogeneously increased hepatic parenchymal echogenicity. Portal vein is patent on color Doppler imaging with normal direction of blood flow towards the liver. IMPRESSION: Cholecystolithiasis without sonographic evidence of acute cholecystitis. The common duct is normal in  caliber. Heterogeneous hyperechogenicity of the hepatic parenchyma. This is a nonspecific finding, which may be seen in the setting of hepatic steatosis or other chronic hepatic parenchymal disease. Electronically Signed   By: Kellie Simmering DO   On: 06/06/2019 07:54    Radiology DG Chest 2 View  Result Date: 06/06/2019 CLINICAL DATA:  Centralized chest pain EXAM: CHEST - 2 VIEW COMPARISON:  08/15/2017 FINDINGS: The heart size and mediastinal contours are within normal limits. Both lungs are clear. The visualized skeletal structures are unremarkable. IMPRESSION: No active cardiopulmonary disease. Electronically Signed   By: Randa Ngo M.D.   On: 06/06/2019 02:32   US Abdomen Limited RUQ  Result Date: 06/06/2019 CLINICAL DATA:  Right upper quadrant pain. Additional history provided by scanning technologist: Right upper quadrant pain for 1 week. EXAM: ULTRASOUND ABDOMEN LIMITED RIGHT UPPER QUADRANT COMPARISON:  No pertinent prior studies available for comparison. FINDINGS: Gallbladder: Cholecystolithiasis. No gallbladder wall thickening is visualized. No sonographic Percell Miller sign is elicited by the scanning technologist. Common bile duct: Diameter: 6 mm, within normal limits. Liver: No focal lesion identified. Heterogeneously increased hepatic parenchymal echogenicity. Portal vein is patent on color Doppler imaging with normal direction of blood flow towards the liver. IMPRESSION: Cholecystolithiasis without sonographic evidence of acute cholecystitis. The common duct is normal in caliber. Heterogeneous hyperechogenicity of the hepatic parenchyma. This is a nonspecific finding, which may be seen in the setting of hepatic steatosis or other chronic hepatic parenchymal disease. Electronically Signed   By: Kellie Simmering DO   On: 06/06/2019 07:54    Procedures Procedures (including critical care time)  Medications Ordered in ED Medications  sodium chloride flush (NS) 0.9 % injection 3 mL (has no  administration in time range)  piperacillin-tazobactam (ZOSYN) IVPB 3.375 g (has no administration in time range)  acetaminophen (TYLENOL) tablet 650 mg (has no administration in time range)    Or  acetaminophen (TYLENOL) suppository 650 mg (has no administration in time range)  oxyCODONE (Oxy IR/ROXICODONE) immediate release tablet 5-10 mg (has no administration in time range)  morphine 4 MG/ML injection 4 mg (4 mg Intravenous Given 06/07/19 0210)  diphenhydrAMINE (BENADRYL) capsule 25 mg (has no administration in time range)    Or  diphenhydrAMINE (BENADRYL) injection 25 mg (has no administration in time range)  ondansetron (ZOFRAN-ODT) disintegrating tablet 4 mg (has no administration in time range)    Or  ondansetron (ZOFRAN) injection 4 mg (has no administration in time range)  pantoprazole (PROTONIX) injection 40 mg (has no administration in time range)  metoprolol tartrate (LOPRESSOR) injection 5 mg (has no administration in time range)  insulin aspart (novoLOG) injection 0-15 Units (has no administration in time range)  alum & mag hydroxide-simeth (MAALOX/MYLANTA) 200-200-20 MG/5ML suspension 30 mL (30 mLs Oral Given 06/06/19 2339)  iohexol (OMNIPAQUE) 300 MG/ML solution 125 mL (125 mLs Intravenous Contrast Given 06/06/19 2359)  piperacillin-tazobactam (ZOSYN) IVPB 3.375 g (0 g Intravenous Stopped 06/07/19 0118)  fentaNYL (SUBLIMAZE) injection 50 mcg (50 mcg Intravenous Given 06/07/19 0043)     ED Course  I have reviewed the  triage vital signs and the nursing notes.  Pertinent labs & imaging results that were available during my care of the patient were reviewed by me and considered in my medical decision making (see chart for details).    Case d/w Dr. Grandville Silos who will see the patient.    Final Clinical Impression(s) / ED Diagnoses Admit for acute cholecystitis    Jayant Kriz, MD 06/07/19 0230

## 2019-06-06 NOTE — ED Provider Notes (Addendum)
Patient care was taken over from Dr. Manus Gunning.  In short, he presented with epigastric pain associated with some vomiting.  This started after eating at [facility].  He says he has a longstanding history of GERD which felt similar to the today symptoms other than it was worse.  He has had 2 nebs troponins with low suspicion that this is related to ACS.  He feels much better after GI cocktail.  He does have some evidence of cholelithiasis on ultrasound but no cholecystitis.  His LFTs and lipase are normal.  Currently he is asymptomatic.  He was discharged home in good condition.  He was encouraged to follow-up with his PCP and was given referral to [facility] surgery as needed.  He will continue his acid reducing medicines and was given a prescription for Carafate by Dr. Manus Gunning.  Return precautions were given.  Of note, he does have some mild tachycardia but he says this is normal for him.  He does not report any other symptoms.  No shortness of breath.  No current abdominal pain.  He feels a little bit of anxiety which she also says is normal for him.   Rolan Bucco, MD 06/06/19 8184    Rolan Bucco, MD 06/06/19 [phone]

## 2019-06-06 NOTE — Discharge Instructions (Addendum)
There is no evidence of heart attack.  He should follow-up with his GI doctor for an endoscopy.  Take your Protonix as prescribed and add Carafate.  Avoid alcohol, caffeine, spicy foods.  Return to the ED if you develop exertional chest pain, chest pain associated shortness of breath, nausea, vomiting, sweating or other concerns.  I did give you a referral to [facility] surgery if you continue having symptoms, they may need to further assess whether your gallbladder needs to be removed.

## 2019-06-06 NOTE — ED Triage Notes (Signed)
Pt seen and discharged early this morning for chest pain.  States pain resolved after getting GI cocktail.   Pain returned mid chest and now having right sided abd pain.

## 2019-06-06 NOTE — ED Notes (Signed)
Patient given discharge instructions. Questions were answered. Patient verbalized understanding of discharge instructions and care at home.  

## 2019-06-06 NOTE — ED Notes (Signed)
Pt transported to US

## 2019-06-07 ENCOUNTER — Observation Stay (HOSPITAL_COMMUNITY): Payer: 59 | Admitting: Certified Registered Nurse Anesthetist

## 2019-06-07 ENCOUNTER — Encounter (HOSPITAL_COMMUNITY): Payer: Self-pay | Admitting: Emergency Medicine

## 2019-06-07 ENCOUNTER — Encounter (HOSPITAL_COMMUNITY): Admission: EM | Disposition: A | Discharge status: Home / Self Care | Payer: Self-pay | Source: Home / Self Care

## 2019-06-07 DIAGNOSIS — K810 Acute cholecystitis: Secondary | ICD-10-CM | POA: Diagnosis present

## 2019-06-07 DIAGNOSIS — K8012 Calculus of gallbladder with acute and chronic cholecystitis without obstruction: Secondary | ICD-10-CM | POA: Diagnosis present

## 2019-06-07 DIAGNOSIS — K819 Cholecystitis, unspecified: Secondary | ICD-10-CM | POA: Diagnosis present

## 2019-06-07 DIAGNOSIS — J9811 Atelectasis: Secondary | ICD-10-CM | POA: Diagnosis not present

## 2019-06-07 DIAGNOSIS — Z7984 Long term (current) use of oral hypoglycemic drugs: Secondary | ICD-10-CM | POA: Diagnosis not present

## 2019-06-07 DIAGNOSIS — K82A1 Gangrene of gallbladder in cholecystitis: Secondary | ICD-10-CM | POA: Diagnosis present

## 2019-06-07 DIAGNOSIS — F419 Anxiety disorder, unspecified: Secondary | ICD-10-CM | POA: Diagnosis present

## 2019-06-07 DIAGNOSIS — E119 Type 2 diabetes mellitus without complications: Secondary | ICD-10-CM | POA: Diagnosis present

## 2019-06-07 DIAGNOSIS — Z7982 Long term (current) use of aspirin: Secondary | ICD-10-CM | POA: Diagnosis not present

## 2019-06-07 DIAGNOSIS — Z79899 Other long term (current) drug therapy: Secondary | ICD-10-CM | POA: Diagnosis not present

## 2019-06-07 DIAGNOSIS — Z6841 Body Mass Index (BMI) 40.0 and over, adult: Secondary | ICD-10-CM | POA: Diagnosis not present

## 2019-06-07 DIAGNOSIS — Z9049 Acquired absence of other specified parts of digestive tract: Secondary | ICD-10-CM

## 2019-06-07 DIAGNOSIS — G4733 Obstructive sleep apnea (adult) (pediatric): Secondary | ICD-10-CM | POA: Diagnosis present

## 2019-06-07 DIAGNOSIS — Z20822 Contact with and (suspected) exposure to covid-19: Secondary | ICD-10-CM | POA: Diagnosis present

## 2019-06-07 DIAGNOSIS — I10 Essential (primary) hypertension: Secondary | ICD-10-CM | POA: Diagnosis present

## 2019-06-07 HISTORY — PX: CHOLECYSTECTOMY: SHX55

## 2019-06-07 LAB — URINALYSIS, ROUTINE W REFLEX MICROSCOPIC
Bacteria, UA: NONE SEEN
Bilirubin Urine: NEGATIVE
Glucose, UA: >=500 mg/dL — AB
Hgb urine dipstick: NEGATIVE
Ketones, ur: 20 mg/dL — AB
Leukocytes,Ua: NEGATIVE
Nitrite: NEGATIVE
Protein, ur: 30 mg/dL — AB
Specific Gravity, Urine: 1.035 — ABNORMAL HIGH (ref 1.005–1.030)
pH: 6 (ref 5.0–8.0)

## 2019-06-07 LAB — GLUCOSE, CAPILLARY
Glucose-Capillary: 195 mg/dL — ABNORMAL HIGH (ref 70–99)
Glucose-Capillary: 223 mg/dL — ABNORMAL HIGH (ref 70–99)
Glucose-Capillary: 126 mg/dL — ABNORMAL HIGH (ref 70–99)
Glucose-Capillary: 117 mg/dL — ABNORMAL HIGH (ref 70–99)

## 2019-06-07 LAB — RESPIRATORY PANEL BY RT PCR (FLU A&B, COVID)
Influenza A by PCR: NEGATIVE
Influenza B by PCR: NEGATIVE
SARS Coronavirus 2 by RT PCR: NEGATIVE

## 2019-06-07 LAB — HEMOGLOBIN A1C
Hgb A1c MFr Bld: 6.9 % — ABNORMAL HIGH (ref 4.8–5.6)
Mean Plasma Glucose: 151.33 mg/dL

## 2019-06-07 LAB — HIV ANTIBODY (ROUTINE TESTING W REFLEX): HIV Screen 4th Generation wRfx: NONREACTIVE

## 2019-06-07 LAB — CBG MONITORING, ED: Glucose-Capillary: 211 mg/dL — ABNORMAL HIGH (ref 70–99)

## 2019-06-07 SURGERY — LAPAROSCOPIC CHOLECYSTECTOMY
Anesthesia: General

## 2019-06-07 MED ORDER — INSULIN ASPART 100 UNIT/ML ~~LOC~~ SOLN
SUBCUTANEOUS | Status: AC
  Filled 2019-06-07: qty 1

## 2019-06-07 MED ORDER — METOPROLOL TARTRATE 5 MG/5ML IV SOLN
INTRAVENOUS | Status: AC
  Filled 2019-06-07: qty 5

## 2019-06-07 MED ORDER — DIPHENHYDRAMINE HCL 50 MG/ML IJ SOLN: 25.0000 mg | Freq: Four times a day (QID) | INTRAMUSCULAR | Status: DC | PRN

## 2019-06-07 MED ORDER — FENTANYL CITRATE (PF) 100 MCG/2ML IJ SOLN
50.0000 ug | Freq: Once | INTRAMUSCULAR | Status: AC
  Administered 2019-06-07: 50 ug via INTRAVENOUS
  Filled 2019-06-07: qty 2

## 2019-06-07 MED ORDER — BUPIVACAINE HCL (PF) 0.25 % IJ SOLN
INTRAMUSCULAR | Status: DC | PRN
  Administered 2019-06-07: 30 mL

## 2019-06-07 MED ORDER — PHENYLEPHRINE 40 MCG/ML (10ML) SYRINGE FOR IV PUSH (FOR BLOOD PRESSURE SUPPORT)
PREFILLED_SYRINGE | INTRAVENOUS | Status: AC
  Filled 2019-06-07: qty 10

## 2019-06-07 MED ORDER — VASOPRESSIN 20 UNIT/ML IV SOLN
INTRAVENOUS | Status: AC
  Filled 2019-06-07: qty 1

## 2019-06-07 MED ORDER — OXYCODONE HCL 5 MG PO TABS
5.0000 mg | ORAL_TABLET | Freq: Once | ORAL | Status: AC | PRN
  Administered 2019-06-07: 5 mg via ORAL

## 2019-06-07 MED ORDER — PROPOFOL 10 MG/ML IV BOLUS
INTRAVENOUS | Status: DC | PRN
Start: 2019-06-07 — End: 2019-06-07
  Administered 2019-06-07: 200 mg via INTRAVENOUS

## 2019-06-07 MED ORDER — SUCCINYLCHOLINE CHLORIDE 200 MG/10ML IV SOSY
PREFILLED_SYRINGE | INTRAVENOUS | Status: AC
  Filled 2019-06-07: qty 10

## 2019-06-07 MED ORDER — ONDANSETRON 4 MG PO TBDP: 4.0000 mg | ORAL_TABLET | Freq: Four times a day (QID) | ORAL | Status: DC | PRN

## 2019-06-07 MED ORDER — SODIUM CHLORIDE 0.9 % IR SOLN
Status: DC | PRN
  Administered 2019-06-07: 1000 mL

## 2019-06-07 MED ORDER — ACETAMINOPHEN 650 MG RE SUPP: 650.0000 mg | Freq: Four times a day (QID) | RECTAL | Status: DC | PRN

## 2019-06-07 MED ORDER — LOSARTAN POTASSIUM 25 MG PO TABS
25.0000 mg | ORAL_TABLET | Freq: Every day | ORAL | Status: DC
  Administered 2019-06-07 – 2019-06-11 (×5): 25 mg via ORAL
  Filled 2019-06-07 (×5): qty 1

## 2019-06-07 MED ORDER — FENTANYL CITRATE (PF) 100 MCG/2ML IJ SOLN
25.0000 ug | INTRAMUSCULAR | Status: DC | PRN
  Administered 2019-06-07 (×2): 50 ug via INTRAVENOUS

## 2019-06-07 MED ORDER — INSULIN ASPART 100 UNIT/ML ~~LOC~~ SOLN
5.0000 [IU] | Freq: Once | SUBCUTANEOUS | Status: AC
  Administered 2019-06-07: 5 [IU] via SUBCUTANEOUS

## 2019-06-07 MED ORDER — SODIUM CHLORIDE (PF) 0.9 % IJ SOLN
INTRAMUSCULAR | Status: AC
  Filled 2019-06-07: qty 10

## 2019-06-07 MED ORDER — PHENYLEPHRINE 40 MCG/ML (10ML) SYRINGE FOR IV PUSH (FOR BLOOD PRESSURE SUPPORT)
PREFILLED_SYRINGE | INTRAVENOUS | Status: AC
  Filled 2019-06-07: qty 20

## 2019-06-07 MED ORDER — SUCCINYLCHOLINE CHLORIDE 20 MG/ML IJ SOLN
INTRAMUSCULAR | Status: DC | PRN
  Administered 2019-06-07: 140 mg via INTRAVENOUS

## 2019-06-07 MED ORDER — PIPERACILLIN-TAZOBACTAM 3.375 G IVPB
3.3750 g | Freq: Three times a day (TID) | INTRAVENOUS | Status: DC
  Administered 2019-06-07: 11:00:00 3.375 g via INTRAVENOUS
  Filled 2019-06-07: qty 50

## 2019-06-07 MED ORDER — ONDANSETRON HCL 4 MG/2ML IJ SOLN
INTRAMUSCULAR | Status: AC
  Filled 2019-06-07: qty 2

## 2019-06-07 MED ORDER — LACTATED RINGERS IV SOLN: INTRAVENOUS | Status: DC

## 2019-06-07 MED ORDER — ROCURONIUM BROMIDE 10 MG/ML (PF) SYRINGE
PREFILLED_SYRINGE | INTRAVENOUS | Status: AC
  Filled 2019-06-07: qty 10

## 2019-06-07 MED ORDER — ROCURONIUM BROMIDE 100 MG/10ML IV SOLN
INTRAVENOUS | Status: DC | PRN
  Administered 2019-06-07: 20 mg via INTRAVENOUS
  Administered 2019-06-07: 50 mg via INTRAVENOUS

## 2019-06-07 MED ORDER — OXYCODONE HCL 5 MG/5ML PO SOLN: 5.0000 mg | Freq: Once | ORAL | Status: AC | PRN

## 2019-06-07 MED ORDER — PIPERACILLIN-TAZOBACTAM 3.375 G IVPB
3.3750 g | Freq: Three times a day (TID) | INTRAVENOUS | Status: AC
  Administered 2019-06-07 – 2019-06-08 (×3): 3.375 g via INTRAVENOUS
  Filled 2019-06-07 (×3): qty 50

## 2019-06-07 MED ORDER — FENTANYL CITRATE (PF) 250 MCG/5ML IJ SOLN
INTRAMUSCULAR | Status: DC | PRN
  Administered 2019-06-07 (×3): 50 ug via INTRAVENOUS
  Administered 2019-06-07: 100 ug via INTRAVENOUS

## 2019-06-07 MED ORDER — OXYCODONE HCL 5 MG PO TABS
ORAL_TABLET | ORAL | Status: AC
  Filled 2019-06-07: qty 1

## 2019-06-07 MED ORDER — INSULIN ASPART 100 UNIT/ML ~~LOC~~ SOLN
0.0000 [IU] | SUBCUTANEOUS | Status: DC
  Administered 2019-06-07: 2 [IU] via SUBCUTANEOUS
  Administered 2019-06-07: 5 [IU] via SUBCUTANEOUS
  Administered 2019-06-08: 3 [IU] via SUBCUTANEOUS
  Administered 2019-06-08 – 2019-06-09 (×3): 2 [IU] via SUBCUTANEOUS
  Administered 2019-06-09 – 2019-06-11 (×2): 3 [IU] via SUBCUTANEOUS

## 2019-06-07 MED ORDER — PIPERACILLIN-TAZOBACTAM 3.375 G IVPB 30 MIN
3.3750 g | Freq: Once | INTRAVENOUS | Status: AC
  Administered 2019-06-07: 3.375 g via INTRAVENOUS
  Filled 2019-06-07: qty 50

## 2019-06-07 MED ORDER — DIPHENHYDRAMINE HCL 25 MG PO CAPS: 25.0000 mg | ORAL_CAPSULE | Freq: Four times a day (QID) | ORAL | Status: DC | PRN

## 2019-06-07 MED ORDER — PANTOPRAZOLE SODIUM 40 MG IV SOLR
40.0000 mg | Freq: Every day | INTRAVENOUS | Status: DC
  Administered 2019-06-07 – 2019-06-10 (×4): 40 mg via INTRAVENOUS
  Filled 2019-06-07 (×4): qty 40

## 2019-06-07 MED ORDER — ACETAMINOPHEN 325 MG PO TABS: 650.0000 mg | ORAL_TABLET | Freq: Four times a day (QID) | ORAL | Status: DC | PRN

## 2019-06-07 MED ORDER — SUGAMMADEX SODIUM 500 MG/5ML IV SOLN
INTRAVENOUS | Status: AC
  Filled 2019-06-07: qty 5

## 2019-06-07 MED ORDER — ONDANSETRON HCL 4 MG/2ML IJ SOLN: 4.0000 mg | Freq: Once | INTRAMUSCULAR | Status: DC | PRN

## 2019-06-07 MED ORDER — PHENYLEPHRINE HCL (PRESSORS) 10 MG/ML IV SOLN
INTRAVENOUS | Status: AC
  Filled 2019-06-07: qty 1

## 2019-06-07 MED ORDER — LACTATED RINGERS IV SOLN
INTRAVENOUS | Status: DC | PRN
Start: 2019-06-07 — End: 2019-06-07

## 2019-06-07 MED ORDER — ACETAMINOPHEN 500 MG PO TABS
1000.0000 mg | ORAL_TABLET | Freq: Four times a day (QID) | ORAL | Status: DC
  Administered 2019-06-07 – 2019-06-11 (×14): 1000 mg via ORAL
  Filled 2019-06-07 (×14): qty 2

## 2019-06-07 MED ORDER — LIDOCAINE 2% (20 MG/ML) 5 ML SYRINGE
INTRAMUSCULAR | Status: AC
  Filled 2019-06-07: qty 5

## 2019-06-07 MED ORDER — ONDANSETRON HCL 4 MG/2ML IJ SOLN
INTRAMUSCULAR | Status: DC | PRN
Start: 2019-06-07 — End: 2019-06-07
  Administered 2019-06-07: 4 mg via INTRAVENOUS

## 2019-06-07 MED ORDER — FENTANYL CITRATE (PF) 100 MCG/2ML IJ SOLN
INTRAMUSCULAR | Status: AC
  Filled 2019-06-07: qty 2

## 2019-06-07 MED ORDER — IBUPROFEN 600 MG PO TABS
600.0000 mg | ORAL_TABLET | Freq: Four times a day (QID) | ORAL | Status: DC | PRN
  Filled 2019-06-07: qty 3

## 2019-06-07 MED ORDER — OXYCODONE HCL 5 MG PO TABS
5.0000 mg | ORAL_TABLET | ORAL | Status: DC | PRN
  Administered 2019-06-08 (×2): 5 mg via ORAL
  Administered 2019-06-09 (×2): 10 mg via ORAL
  Filled 2019-06-07 (×2): qty 1
  Filled 2019-06-07 (×2): qty 2

## 2019-06-07 MED ORDER — ALBUMIN HUMAN 5 % IV SOLN
INTRAVENOUS | Status: DC | PRN
Start: 2019-06-07 — End: 2019-06-07

## 2019-06-07 MED ORDER — MORPHINE SULFATE (PF) 4 MG/ML IV SOLN
4.0000 mg | INTRAVENOUS | Status: DC | PRN
  Administered 2019-06-07 (×4): 4 mg via INTRAVENOUS
  Filled 2019-06-07 (×4): qty 1

## 2019-06-07 MED ORDER — LIDOCAINE HCL (CARDIAC) PF 100 MG/5ML IV SOSY
PREFILLED_SYRINGE | INTRAVENOUS | Status: DC | PRN
  Administered 2019-06-07: 60 mg via INTRATRACHEAL

## 2019-06-07 MED ORDER — MIDAZOLAM HCL 2 MG/2ML IJ SOLN
INTRAMUSCULAR | Status: AC
  Filled 2019-06-07: qty 2

## 2019-06-07 MED ORDER — METOPROLOL TARTRATE 5 MG/5ML IV SOLN: 5.0000 mg | Freq: Four times a day (QID) | INTRAVENOUS | Status: DC | PRN

## 2019-06-07 MED ORDER — FENTANYL CITRATE (PF) 250 MCG/5ML IJ SOLN
INTRAMUSCULAR | Status: AC
  Filled 2019-06-07: qty 5

## 2019-06-07 MED ORDER — PROPOFOL 10 MG/ML IV BOLUS
INTRAVENOUS | Status: AC
  Filled 2019-06-07: qty 40

## 2019-06-07 MED ORDER — ONDANSETRON HCL 4 MG/2ML IJ SOLN: 4.0000 mg | Freq: Four times a day (QID) | INTRAMUSCULAR | Status: DC | PRN

## 2019-06-07 MED ORDER — ARTIFICIAL TEARS OPHTHALMIC OINT
TOPICAL_OINTMENT | OPHTHALMIC | Status: AC
  Filled 2019-06-07: qty 3.5

## 2019-06-07 MED ORDER — MIDAZOLAM HCL 5 MG/5ML IJ SOLN
INTRAMUSCULAR | Status: DC | PRN
  Administered 2019-06-07: 2 mg via INTRAVENOUS

## 2019-06-07 MED ORDER — LACTATED RINGERS IV BOLUS
500.0000 mL | Freq: Once | INTRAVENOUS | Status: AC
  Administered 2019-06-07: 500 mL via INTRAVENOUS

## 2019-06-07 MED ORDER — BUPIVACAINE HCL (PF) 0.25 % IJ SOLN
INTRAMUSCULAR | Status: AC
  Filled 2019-06-07: qty 30

## 2019-06-07 MED ORDER — SUGAMMADEX SODIUM 500 MG/5ML IV SOLN
INTRAVENOUS | Status: DC | PRN
Start: 2019-06-07 — End: 2019-06-07
  Administered 2019-06-07: 300 mg via INTRAVENOUS

## 2019-06-07 MED ORDER — PHENYLEPHRINE HCL (PRESSORS) 10 MG/ML IV SOLN
INTRAVENOUS | Status: DC | PRN
  Administered 2019-06-07 (×6): 80 ug via INTRAVENOUS

## 2019-06-07 MED ORDER — VASOPRESSIN 20 UNIT/ML IV SOLN
INTRAVENOUS | Status: DC | PRN
  Administered 2019-06-07 (×2): 1 [IU] via INTRAVENOUS

## 2019-06-07 MED ORDER — 0.9 % SODIUM CHLORIDE (POUR BTL) OPTIME
TOPICAL | Status: DC | PRN
  Administered 2019-06-07: 1000 mL

## 2019-06-07 SURGICAL SUPPLY — 53 items
APPLIER CLIP 5 13 M/L LIGAMAX5 (MISCELLANEOUS) ×2
BLADE CLIPPER SURG (BLADE) IMPLANT
CANISTER SUCT 3000ML PPV (MISCELLANEOUS) ×2 IMPLANT
CHLORAPREP W/TINT 26 (MISCELLANEOUS) ×2 IMPLANT
CLIP APPLIE 5 13 M/L LIGAMAX5 (MISCELLANEOUS) ×1 IMPLANT
CNTNR URN SCR LID CUP LEK RST (MISCELLANEOUS) ×1 IMPLANT
CONT SPEC 4OZ STRL OR WHT (MISCELLANEOUS) ×2
COVER SURGICAL LIGHT HANDLE (MISCELLANEOUS) ×2 IMPLANT
COVER WAND RF STERILE (DRAPES) IMPLANT
DERMABOND ADVANCED (GAUZE/BANDAGES/DRESSINGS) ×1
DERMABOND ADVANCED .7 DNX12 (GAUZE/BANDAGES/DRESSINGS) ×1 IMPLANT
DISSECTOR BLUNT TIP ENDO 5MM (MISCELLANEOUS) IMPLANT
ELECT CAUTERY BLADE 6.4 (BLADE) IMPLANT
ELECT REM PT RETURN 9FT ADLT (ELECTROSURGICAL) ×2
ELECTRODE REM PT RTRN 9FT ADLT (ELECTROSURGICAL) ×1 IMPLANT
EVACUATOR SILICONE 100CC (DRAIN) ×2 IMPLANT
GAUZE SPONGE 4X4 12PLY STRL (GAUZE/BANDAGES/DRESSINGS) ×2 IMPLANT
GLOVE BIO SURGEON STRL SZ7.5 (GLOVE) ×2 IMPLANT
GLOVE BIOGEL PI IND STRL 6.5 (GLOVE) ×2 IMPLANT
GLOVE BIOGEL PI IND STRL 7.5 (GLOVE) ×2 IMPLANT
GLOVE BIOGEL PI INDICATOR 6.5 (GLOVE) ×2
GLOVE BIOGEL PI INDICATOR 7.5 (GLOVE) ×2
GLOVE INDICATOR 8.0 STRL GRN (GLOVE) ×2 IMPLANT
GLOVE SURG SS PI 6.5 STRL IVOR (GLOVE) ×4 IMPLANT
GOWN STRL REUS W/ TWL LRG LVL3 (GOWN DISPOSABLE) ×2 IMPLANT
GOWN STRL REUS W/ TWL XL LVL3 (GOWN DISPOSABLE) ×2 IMPLANT
GOWN STRL REUS W/TWL LRG LVL3 (GOWN DISPOSABLE) ×4
GOWN STRL REUS W/TWL XL LVL3 (GOWN DISPOSABLE) ×4
GRASPER SUT TROCAR 14GX15 (MISCELLANEOUS) ×2 IMPLANT
KIT BASIN OR (CUSTOM PROCEDURE TRAY) ×2 IMPLANT
KIT TURNOVER KIT B (KITS) ×2 IMPLANT
NEEDLE INSUFFLATION 14GA 120MM (NEEDLE) ×2 IMPLANT
NS IRRIG 1000ML POUR BTL (IV SOLUTION) ×2 IMPLANT
PAD ARMBOARD 7.5X6 YLW CONV (MISCELLANEOUS) ×2 IMPLANT
PENCIL SMOKE EVACUATOR (MISCELLANEOUS) ×2 IMPLANT
POUCH LAPAROSCOPIC INSTRUMENT (MISCELLANEOUS) ×2 IMPLANT
POUCH SPECIMEN RETRIEVAL 10MM (ENDOMECHANICALS) IMPLANT
SCISSORS LAP 5X35 DISP (ENDOMECHANICALS) ×2 IMPLANT
SET IRRIG TUBING LAPAROSCOPIC (IRRIGATION / IRRIGATOR) ×2 IMPLANT
SET TUBE SMOKE EVAC HIGH FLOW (TUBING) ×2 IMPLANT
SHEARS HARMONIC ACE PLUS 36CM (ENDOMECHANICALS) ×2 IMPLANT
SLEEVE ENDOPATH XCEL 5M (ENDOMECHANICALS) ×4 IMPLANT
SPECIMEN JAR SMALL (MISCELLANEOUS) IMPLANT
SUT ETHILON 2 0 FS 18 (SUTURE) ×2 IMPLANT
SUT MNCRL AB 4-0 PS2 18 (SUTURE) ×4 IMPLANT
TOWEL GREEN STERILE (TOWEL DISPOSABLE) ×2 IMPLANT
TOWEL GREEN STERILE FF (TOWEL DISPOSABLE) ×2 IMPLANT
TRAY LAPAROSCOPIC MC (CUSTOM PROCEDURE TRAY) ×2 IMPLANT
TROCAR BLADELESS 5MM (ENDOMECHANICALS) ×2 IMPLANT
TROCAR XCEL 12X100 BLDLESS (ENDOMECHANICALS) ×2 IMPLANT
TROCAR XCEL BLUNT TIP 100MML (ENDOMECHANICALS) ×2 IMPLANT
TROCAR XCEL NON-BLD 5MMX100MML (ENDOMECHANICALS) ×2 IMPLANT
WATER STERILE IRR 1000ML POUR (IV SOLUTION) ×2 IMPLANT

## 2019-06-07 NOTE — Progress Notes (Addendum)
Contacted RRT RN and AC to collaborate on patient condition due to [place], increased respiratory rate, increased pulse with ST, increased temp, and decreased O2 sat.  Requested Mitzi Davenport, RRT RN to assess patient.

## 2019-06-07 NOTE — Anesthesia Procedure Notes (Signed)
Procedure Name: Intubation Date/Time: 06/07/2019 10:40 AM Performed by: Claudina Lick, CRNA Pre-anesthesia Checklist: Patient identified, Emergency Drugs available, Suction available, Patient being monitored and Timeout performed Patient Re-evaluated:Patient Re-evaluated prior to induction Oxygen Delivery Method: Circle system utilized Preoxygenation: Pre-oxygenation with 100% oxygen Induction Type: IV induction, Rapid sequence and Cricoid Pressure applied Laryngoscope Size: Miller and 2 Grade View: Grade III Tube type: Oral Tube size: 7.5 mm Number of attempts: 1 Airway Equipment and Method: Stylet Placement Confirmation: ETT inserted through vocal cords under direct vision,  positive ETCO2 and breath sounds checked- equal and bilateral Secured at: 23 cm Tube secured with: Tape Dental Injury: Teeth and Oropharynx as per pre-operative assessment

## 2019-06-07 NOTE — Progress Notes (Addendum)
   06/07/19 1850  Assess: MEWS Score  Temp (!) 102 F (38.9 C)  BP 133/83  Pulse Rate (!) 130  ECG Heart Rate (!) 130  Resp (!) 28  Level of Consciousness Alert  SpO2 93 %  O2 Device Nasal Cannula  O2 Flow Rate (L/min) 2 L/min  Assess: MEWS Score  MEWS Temp 2  MEWS Systolic 0  MEWS Pulse 3  MEWS RR 2  MEWS LOC 0  MEWS Score 7  MEWS Score Color Red  Assess: if the MEWS score is Yellow or Red  Were vital signs taken at a resting state? Yes  Focused Assessment Documented focused assessment  Early Detection of Sepsis Score *See Row Information* Medium  MEWS guidelines implemented *See Row Information* Yes  Treat  MEWS Interventions Administered scheduled meds/treatments  Take Vital Signs  Increase Vital Sign Frequency  Red: Q 1hr X 4 then Q 4hr X 4, if remains red, continue Q 4hrs  Escalate  MEWS: Escalate Yellow: discuss with charge nurse/RN and consider discussing with provider and RRT  Notify: Charge Nurse/RN  Name of Charge Nurse/RN Notified Katie  Date Charge Nurse/RN Notified 06/07/19  Time Charge Nurse/RN Notified 1903  Document  Patient Outcome Not stable and remains on department  Progress note created (see row info) Yes   Pt received from 5N at 6.38pm. alert and oriented times 4, morphine 4mg  provided for a complaints of surgical pain. Oxygen 2l continue, LR continue @100 , Fever 102 this time, wife is in bed side and updated, report provided to the night shift nurse to continuity of care.  , RN

## 2019-06-07 NOTE — Progress Notes (Signed)
Subjective Stable sharp MEG abdominal pain.  Objective: Vital signs in last 24 hours: Temp:  [98.8 F (37.1 C)] 98.8 F (37.1 C) (05/09 0000) Pulse Rate:  [107-133] 131 (05/09 0745) Resp:  [31-35] 34 (05/09 0745) BP: (145-167)/(83-116) 156/112 (05/09 0745) SpO2:  [89 %-95 %] 92 % (05/09 0745) Weight:  [122.5 kg] 122.5 kg (05/09 0825)    Intake/Output from previous day: No intake/output data recorded. Intake/Output this shift: No intake/output data recorded.  Gen: NAD, comfortable CV: RRR Pulm: Normal work of breathing Abd: Soft, focally ttp in MEG; no rebound/guarding. Nondistended Ext: SCDs in place  Lab Results: CBC  Recent Labs    06/06/19 0211 06/06/19 2154  WBC 16.6* 23.3*  HGB 15.1 15.0  HCT 47.7 48.1  PLT 318 305   BMET Recent Labs    06/06/19 0211 06/06/19 2154  NA 138 135  K 3.6 3.9  CL 98 97*  CO2 26 24  GLUCOSE 227* 245*  BUN 13 10  CREATININE 1.06 0.89  CALCIUM 9.4 9.4   PT/INR No results for input(s): LABPROT, INR in the last 72 hours. ABG No results for input(s): PHART, HCO3 in the last 72 hours.  Invalid input(s): PCO2, PO2  Studies/Results:  Anti-infectives: Anti-infectives (From admission, onward)   Start     Dose/Rate Route Frequency Ordered Stop   06/07/19 0800  [MAR Hold]  piperacillin-tazobactam (ZOSYN) IVPB 3.375 g     (MAR Hold since Sun 06/07/2019 at 0824.Hold Reason: Transfer to a Procedural area.)   3.375 g 12.5 mL/hr over 240 Minutes Intravenous Every 8 hours 06/07/19 0203     06/07/19 0030  piperacillin-tazobactam (ZOSYN) IVPB 3.375 g     3.375 g 100 mL/hr over 30 Minutes Intravenous  Once 06/07/19 0021 06/07/19 0118       Assessment/Plan: Patient Active Problem List   Diagnosis Date Noted  . Cholecystitis 06/07/2019  . Anxiety 02/26/2019  . Headache 02/08/2019  . Urinary urgency 11/12/2018  . Possible exposure to STD 10/16/2017  . Hypogonadism in male 09/04/2017  . Bilateral foot pain 03/08/2017  . Dysphagia  08/29/2016  . ADHD 03/28/2016  . Morbid obesity ([place]) 12/06/2015  . Preventative health care 03/10/2015  . Skin lesion 03/10/2015  . GERD (gastroesophageal reflux disease) 11/04/2013  . Tachycardia 11/04/2013  . Nonspecific abnormal electrocardiogram (ECG) (EKG) 11/04/2013  . Non-compliant behavior 10/14/2013  . Uncontrolled diabetes mellitus ([place]) 08/19/2012  . PLMD (periodic limb movement disorder) 05/30/2012  . SLEEP DISORDER 12/05/2009  . Hyperlipidemia 08/25/2009  . GAD (generalized anxiety disorder) 08/25/2009  . Obstructive sleep apnea 08/25/2009  . RHINITIS 05/24/2009   Mr. Satter is a very pleasant 28yoM with hx of HTN, DM, OSA, GERD here with acute cholecystitis  -The anatomy and physiology of the hepatobiliary system was discussed at length with the patient. The pathophysiology of gallbladder disease was discussed at length as well. -The options for treatment were discussed including ongoing IV antibiotics and percutaneous drainage vs surgery - laparoscopic and potential open techniques to cholecystectomy. We discussed each and he has opted to proceed with surgery. We reviewed potential for subtotal cholecystectomy as well with drain in gallbladder fossa. We also reviewed higher potential in these scenarios for bile leaks and needing GI interventions such as ERCP/stent placement. -The planned procedure, material risks (including, but not limited to, pain, bleeding, infection, scarring, need for blood transfusion, damage to surrounding structures- blood vessels/nerves/viscus/organs, damage to bile duct, bile leak, need for additional procedures, hernia, medication related reactions, worsening of pre-existing  medical conditions, pancreatitis, pneumonia, heart attack, stroke, death) benefits and alternatives to surgery were discussed at length. I noted a good probability that the procedure would help improve his symptoms. The patient's questions were answered to his satisfaction, he  voiced understanding and after considering all options, has elected to proceed with surgery. Additionally, we discussed typical postoperative expectations and the recovery process.   LOS: 0 days   Stephanie Coup. Cliffton Asters, M.D. [facility]

## 2019-06-07 NOTE — Plan of Care (Signed)

## 2019-06-07 NOTE — H&P (Signed)
Gregory Owens is an 49 y.o. male.   Chief Complaint: Epigastric abdominal pain HPI: 49 year old male with history of diabetes presented to the emergency department with epigastric pain about 24 hours ago.  He was worked up and found to have gallstones but no evidence of cholecystitis and was discharged.  He ate Gregory Owens later yesterday and had recurrence of his pain.  He returned to the emergency department and CT scan was performed showing cholecystitis.  White blood cell count is elevated 23,300.  Liver function tests are normal.  I was asked to admit him.  Past Medical History:  Diagnosis Date  . Abnormal EKG    NS ST-T EKG changes with negative Stress cardiolite 2003  . Anxiety   . Dehydration 2005   [place] , [place]  . Diabetes mellitus without complication (HCC)   . Hyperglycemia   . Hyperlipidemia   . IBS (irritable bowel syndrome)   . OSA (obstructive sleep apnea)    resolved post surgery    Past Surgical History:  Procedure Laterality Date  . TONSILLECTOMY AND ADENOIDECTOMY      with above  . UVULOPALATOPHARYNGOPLASTY  2004   Post op bleeding complication;  Dr Gregory Owens  . WISDOM TOOTH EXTRACTION      Family History  Problem Relation Age of Onset  . Hyperlipidemia Mother   . Hypertension Mother   . Other Mother        Inflammatory Occipital Lymphadenitis  . Hypertension Father   . Hyperlipidemia Father   . Diabetes Maternal Grandmother   . Heart attack Maternal Grandmother 60  . Diabetes Paternal Grandmother   . Heart attack Paternal Grandmother 61  . Cirrhosis Brother        autoimmune hepatitis (twin)  . Colon cancer Paternal Grandfather    Social History:  reports that he has never smoked. He has never used smokeless tobacco. He reports that he does not drink alcohol or use drugs.  Allergies: No Known Allergies  (Not in a hospital admission)   Results for orders placed or performed during the hospital encounter of 06/06/19 (from the past 48 hour(s))   Lipase, blood     Status: None   Collection Time: 06/06/19  9:54 PM  Result Value Ref Range   Lipase 37 11 - 51 U/L    Comment: Performed at [facility], [address] [place], [place] [address]  Comprehensive metabolic panel     Status: Abnormal   Collection Time: 06/06/19  9:54 PM  Result Value Ref Range   Sodium 135 135 - 145 mmol/L   Potassium 3.9 3.5 - 5.1 mmol/L   Chloride 97 (L) 98 - 111 mmol/L   CO2 24 22 - 32 mmol/L   Glucose, Bld 245 (H) 70 - 99 mg/dL    Comment: Glucose reference range applies only to samples taken after fasting for at least 8 hours.   BUN 10 6 - 20 mg/dL   Creatinine, Ser 2.83 0.61 - 1.24 mg/dL   Calcium 9.4 8.9 - 15.1 mg/dL   Total Protein 8.0 6.5 - 8.1 g/dL   Albumin 3.7 3.5 - 5.0 g/dL   AST 19 15 - 41 U/L   ALT 19 0 - 44 U/L   Alkaline Phosphatase 63 38 - 126 U/L   Total Bilirubin 1.2 0.3 - 1.2 mg/dL   GFR calc non Af Amer >60 >60 mL/min   GFR calc Af Amer >60 >60 mL/min   Anion gap 14 5 - 15  Comment: Performed at [facility], [place] [address], [place], [place] [address]  CBC     Status: Abnormal   Collection Time: 06/06/19  9:54 PM  Result Value Ref Range   WBC 23.3 (H) 4.0 - 10.5 K/uL   RBC 5.10 4.22 - 5.81 MIL/uL   Hemoglobin 15.0 13.0 - 17.0 g/dL   HCT 48.1 39.0 - 52.0 %   MCV 94.3 80.0 - 100.0 fL   MCH 29.4 26.0 - 34.0 pg   MCHC 31.2 30.0 - 36.0 g/dL   RDW 12.6 11.5 - 15.5 %   Platelets 305 150 - 400 K/uL   nRBC 0.0 0.0 - 0.2 %    Comment: Performed at [facility], [place] [address], [place], [place] [address]  Urinalysis, Routine w reflex microscopic     Status: Abnormal   Collection Time: 06/06/19 11:44 PM  Result Value Ref Range   Color, Urine YELLOW YELLOW   APPearance CLEAR CLEAR   Specific Gravity, Urine 1.035 (H) 1.005 - 1.030   pH 6.0 5.0 - 8.0   Glucose, UA >=500 (A) NEGATIVE mg/dL   Hgb urine dipstick NEGATIVE NEGATIVE   Bilirubin Urine NEGATIVE NEGATIVE   Ketones, ur 20 (A) NEGATIVE mg/dL    Protein, ur 30 (A) NEGATIVE mg/dL   Nitrite NEGATIVE NEGATIVE   Leukocytes,Ua NEGATIVE NEGATIVE   RBC / HPF 0-5 0 - 5 RBC/hpf   WBC, UA 0-5 0 - 5 WBC/hpf   Bacteria, UA NONE SEEN NONE SEEN   Mucus PRESENT     Comment: Performed at [facility] [address] [place], [place] [address]   DG Chest 2 View  Result Date: 06/06/2019 CLINICAL DATA:  Centralized chest pain EXAM: CHEST - 2 VIEW COMPARISON:  08/15/2017 FINDINGS: The heart size and mediastinal contours are within normal limits. Both lungs are clear. The visualized skeletal structures are unremarkable. IMPRESSION: No active cardiopulmonary disease. Electronically Signed   By: Gregory Owens M.D.   On: 06/06/2019 02:32   CT ABDOMEN PELVIS W CONTRAST  Result Date: 06/07/2019 CLINICAL DATA:  49 year old male with right-sided abdominal pain. EXAM: CT ABDOMEN AND PELVIS WITH CONTRAST TECHNIQUE: Multidetector CT imaging of the abdomen and pelvis was performed using the standard protocol following bolus administration of intravenous contrast. CONTRAST:  125mL OMNIPAQUE IOHEXOL 300 MG/ML  SOLN COMPARISON:  Abdominal ultrasound dated 06/06/2019. FINDINGS: Lower chest: Minimal bibasilar dependent atelectasis. The visualized lung bases are otherwise clear. No intra-abdominal free air or free fluid. Hepatobiliary: Apparent fatty infiltration of the liver. No intrahepatic biliary ductal dilatation. There is a small stone in the neck of the gallbladder. The gallbladder wall appears thickened and edematous most concerning for acute cholecystitis. Clinical correlation recommended. Repeat ultrasound or a HIDA scan may provide better evaluation. Pancreas: Unremarkable. No pancreatic ductal dilatation or surrounding inflammatory changes. Spleen: Normal in size without focal abnormality. Adrenals/Urinary Tract: The adrenal glands are unremarkable. There is no hydronephrosis on either side. The visualized ureters appear unremarkable. The urinary bladder is  collapsed. Stomach/Bowel: Sigmoid diverticulosis without active inflammatory changes. There is no bowel obstruction or active inflammation. The appendix is normal. Vascular/Lymphatic: The abdominal aorta and IVC are unremarkable. No portal venous gas. There is no adenopathy. Reproductive: The prostate and seminal vesicles are grossly unremarkable. No pelvic masses Other: Small fat containing umbilical hernia. Musculoskeletal: No acute or significant osseous findings. IMPRESSION: 1. Cholelithiasis with findings most concerning for acute cholecystitis. Clinical correlation recommended. Repeat ultrasound or a HIDA scan may  provide better evaluation. 2. Fatty liver. 3. Sigmoid diverticulosis. No bowel obstruction. Normal appendix. Electronically Signed   By: Elgie Collard M.D.   On: 06/07/2019 00:16   US Abdomen Limited RUQ  Result Date: 06/06/2019 CLINICAL DATA:  Right upper quadrant pain. Additional history provided by scanning technologist: Right upper quadrant pain for 1 week. EXAM: ULTRASOUND ABDOMEN LIMITED RIGHT UPPER QUADRANT COMPARISON:  No pertinent prior studies available for comparison. FINDINGS: Gallbladder: Cholecystolithiasis. No gallbladder wall thickening is visualized. No sonographic Eulah Pont sign is elicited by the scanning technologist. Common bile duct: Diameter: 6 mm, within normal limits. Liver: No focal lesion identified. Heterogeneously increased hepatic parenchymal echogenicity. Portal vein is patent on color Doppler imaging with normal direction of blood flow towards the liver. IMPRESSION: Cholecystolithiasis without sonographic evidence of acute cholecystitis. The common duct is normal in caliber. Heterogeneous hyperechogenicity of the hepatic parenchyma. This is a nonspecific finding, which may be seen in the setting of hepatic steatosis or other chronic hepatic parenchymal disease. Electronically Signed   By: Jackey Loge DO   On: 06/06/2019 07:54    Review of Systems   Constitutional: Negative.   HENT: Negative.   Eyes: Negative.   Respiratory: Negative.   Cardiovascular: Negative.   Gastrointestinal: Positive for abdominal pain.  Endocrine: Negative.   Genitourinary: Negative.   Musculoskeletal: Negative.   Allergic/Immunologic: Negative.   Neurological: Negative.   Hematological: Negative.   Psychiatric/Behavioral: Negative.     There were no vitals taken for this visit. Physical Exam  Constitutional: He is oriented to person, place, and time. He appears well-developed and well-nourished.  HENT:  Head: Normocephalic.  Right Ear: External ear normal.  Left Ear: External ear normal.  Mouth/Throat: Oropharynx is clear and moist.  Eyes: Pupils are equal, round, and reactive to light. EOM are normal. Right eye exhibits no discharge. Left eye exhibits no discharge. No scleral icterus.  Neck: No tracheal deviation present. No thyromegaly present.  Cardiovascular: Normal rate, regular rhythm, normal heart sounds and intact distal pulses.  Respiratory: Effort normal and breath sounds normal. No respiratory distress. He has no wheezes. He has no rales.  GI: Soft. He exhibits no distension. There is abdominal tenderness. There is no rebound and no guarding.  Tender epigastrium and right upper quadrant, no hepatosplenomegaly  Musculoskeletal:        General: No edema. Normal range of motion.     Cervical back: Neck supple.  Lymphadenopathy:    He has no cervical adenopathy.  Neurological: He is alert and oriented to person, place, and time. He exhibits normal muscle tone.  Skin: Skin is warm and dry.  Psychiatric: He has a normal mood and affect.  A&O x3     Assessment/Plan Cholecystitis - NPO, Zosyn was started in the emergency department, plan laparoscopic cholecystectomy this admission.  Possibly later today pending OR availability.  I discussed the procedure, risks, and benefits with him and his wife.  I answered their questions.  He is  agreeable.  DM - hold metformin, start SSI  Liz Malady, MD 06/07/2019, 1:56 AM

## 2019-06-07 NOTE — Progress Notes (Signed)
CBG 191. Verbal order from Dr. Noreene Larsson to administer 5 units of subq Novolog insulin.   Viviano Simas, RN

## 2019-06-07 NOTE — Significant Event (Signed)
Rapid Response Event Note  Overview: Time Called: 1639 Arrival Time: 1650 Event Type: MEWS Pt admitted for acute cholecystitis with empyema gallbladder s/p laparoscopic subtotal cholecystectomy VS: T 102.59F, BP 108/58, HR 145, RR 25, SpO2 91% on 2LNC.  Initial Focused Assessment: Pt lying in bed, resting. Pt does not appear to be in distress and communicates with ease to staff. Pt is hot, dry to touch. He is able to follow commands and moved extremities appropriately. Pt endorses pain to his surgical site 7/10. 5 mg Oxycodone given at 1549. Abdomen is round, soft. Lung sounds are clear.   Interventions: -500 cc bolus, per MD order -PRN Tylenol -Ice packs to groin -Extra linen removed from bed  Plan of Care (if not transferred): -Pt ordered to be progressive level of care- transfer request placed. -Treat fever and manage pain  Event Summary: Primary RN spoke with attending MD prior to my arrival- new orders were written. Outcome: Transferred (Comment)(Pt progressive level of care.) Event End Time: 1705  Gregory Owens L Gregory Owens

## 2019-06-07 NOTE — Progress Notes (Signed)
   06/07/19 1613  Assess: MEWS Score  Temp (!) 102.7 F (39.3 C)  BP (!) 108/58  Pulse Rate (!) 143  Resp (!) 25  Level of Consciousness Alert  SpO2 91 %  O2 Device Nasal Cannula  Patient Activity (if Appropriate) In bed  O2 Flow Rate (L/min) 2 L/min  Assess: MEWS Score  MEWS Temp 2  MEWS Systolic 0  MEWS Pulse 3  MEWS RR 1  MEWS LOC 0  MEWS Score 6  MEWS Score Color Red    Pt triggered MEWS red, with the attached vitals. Provider paged, new ordered entered, red MEWs protocol followed. Rapid Rn and CN both notified. Pt to be transfered to a progressive floor. Will continue to monitor

## 2019-06-07 NOTE — ED Notes (Signed)
Checked CBG 211, informed RN Joe

## 2019-06-07 NOTE — Op Note (Signed)
06/07/2019 11:48 AM  PATIENT: Gregory Owens  49 y.o. male  Patient Care Team: Biagio Borg, MD as PCP - General (Internal Medicine)  PRE-OPERATIVE DIAGNOSIS: Acute cholecystitis  POST-OPERATIVE DIAGNOSIS: Acute cholecystitis with empyema gallbladder  PROCEDURE: Laparoscopic subtotal cholecystectomy  SURGEON: Sharon Mt. Juanjose Mojica, MD  ASSISTANT: Lorine Bears, MD  ANESTHESIA: General endotracheal  EBL: Total I/O In: 2050 [I.V.:1500; IV Piggyback:550] Out: -   DRAINS: 19Fr round blake drain left drainage remnant infundibulum   SPECIMEN: Gallbladder  COUNTS: Sponge, needle and instrument counts were reported correct x2 at the conclusion of the operation  DISPOSITION: PACU in satisfactory condition  COMPLICATIONS: None  FINDINGS: Subacute gangrenous cholecystitis with empyema gallbladder. Friable woody tissue at infundibulum which precluded safe dissection into triangle of Calot. Therefore a subtotal cholecystectomy was undertaken at level of infundibulum leaving approximately 1 cm remnant in place  INDICATION: Gregory Owens is a very pleasant 49yoM with hx of HTN, DM, GERD, OSA who presented to the emergency department overnight with severe right upper quadrant and midepigastric abdominal pain.  He was evaluated and found to have evidence of cholecystitis on his CT scan.  He had a significant leukocytosis.  LFTs were normal. Lipase was normal.  Of note, he was evaluated the day prior for similar symptoms and underwent right upper quadrant ultrasound which showed evidence of cholelithiasis without sonographic evidence of acute cholecystitis.  Common bile duct measured 6 mm on that study.  DESCRIPTION:   The patient was identified & brought into the operating room. He was then positioned supine on the OR table. SCDs were in place and active during the entire case. He then underwent general endotracheal anesthesia. Pressure points were padded. Hair on the abdomen was clipped by  the OR team. The abdomen was prepped and draped in the standard sterile fashion. Antibiotics were administered. A surgical timeout was performed and confirmed our plan.   Given his habitus, the decision was made to proceed with Optiview trocar entry using a Veress needle technique.  An orogastric tube was placed by anesthesia and confirmed to be to suction.  At Palmer's point, a stab incision was created and the Veress needle introduced into the peritoneal cavity on the first attempt.  Intraperitoneal location was confirmed by the aspiration and saline drop test.  Insufflation commenced to a pressure 15 mmHg using CO2.  Following this, an Optiview trocar was placed under direct visualization into the peritoneal cavity.  The laparoscope was inserted and intraperitoneal inspection demonstrated no evidence of Veress needle or trocar site complication.  The patient was then positioned in reverse Trendelenburg with slight left side down.  A 12 mm trocar was placed obliquely just to the right of the falciform ligament under direct visualization.  2 additional 5 mm trochars were placed along the right subcostal line - one 64mm port in mid subcostal region, another 84mm port in the right flank near the anterior axillary line under direct visualization.  An additional 5 mm trocar was placed under direct visualization in the supraumbilical position.  The liver was inspected and relatively normal in appearance.  The gallbladder is not visible on initial inspection and had been coated and omentum.  This was carefully swept away bluntly.  The gallbladder was quite inflamed in appearance.  It was tense.  Using a laparoscopic aspirator needle, the gallbladder was carefully aspirated and contained purulent fluid.  This allowed the gallbladder to be better grasped. The gallbladder fundus was grasped and elevated cephalad and very friable. An  additional grasper was then placed lower near the infundibulum of the gallbladder and  the infundibulum was retracted laterally. Staying high on the gallbladder, the peritoneum on both sides of the gallbladder was opened with hook cautery. Gentle blunt dissection was then employed working down towards Calot's triangle.  As the infundibulum was approached, there was much thicker, friable, woody/fibrotic tissue present.  This precluded safe dissection any further and so dissection within the triangle of Calot was not felt to be safe.  Given these findings, attention was turned to performing a "subtotal" cholecystectomy.  Staying up high along the infundibulum, the harmonic scalpel was used to open the gallbladder taking this plan dissection back to the posterior wall.  There was approximately 1 cm of infundibulum that was intentionally left in situ.  The gallbladder was then dissected from the liver bed.  Pus was noted to be present within the gallbladder consistent with empyema gallbladder.  The gallbladder is ultimately freed from the liver.  The infundibular stump was inspected and there is no visible extravasation of bile or even a cystic duct orifice per se.  It appears this has been obliterated.  There were no retained stones.  The gallbladder specimen was then placed into an Endo Catch bag.  This was removed from the subxiphoid trocar site.  This was passed off the specimen.  The RUQ was gently irrigated with sterile saline. Hemostasis was then verified. The clips were in good position; the gallbladder fossa was dry. The rest of the abdomen was inspected no injury nor bleeding elsewhere was identified.  A 19 French round Blake drain was placed to drain the gallbladder fossa near the remnant infundibulum and brought out through the lateralmost trocar site.  This was secured to the skin using a 2-0 nylon suture.  The 12 mm trocar was again removed and the fascial defect palpable.  This was then obliterated using a laparoscopic suture passer and a 0 Vicryl suture.  The fascia at this location  was palpated and noted to be completely closed.  The RUQ port was removed under direct visualization and noted to be hemostatic. CO2 was exhausted and the umbilical trocar removed. The skin of all incision sites was approximated with 4-0 monocryl subcuticular suture and dermabond applied. The drain was connected to bulb suction. He was then awakened from anesthesia, extubated, and transferred to a stretcher for transport to PACU in satisfactory condition.

## 2019-06-07 NOTE — Anesthesia Postprocedure Evaluation (Signed)
Anesthesia Post Note  Patient: [facility]  Procedure(s) Performed: LAPAROSCOPIC CHOLECYSTECTOMY (N/A )     Patient location during evaluation: PACU Anesthesia Type: General Level of consciousness: awake and alert Pain management: pain level controlled Vital Signs Assessment: post-procedure vital signs reviewed and stable Respiratory status: spontaneous breathing, nonlabored ventilation, respiratory function stable and patient connected to nasal cannula oxygen Cardiovascular status: blood pressure returned to baseline and stable Postop Assessment: no apparent nausea or vomiting Anesthetic complications: no    Last Vitals:  Vitals:   06/07/19 1850 06/07/19 2017  BP: 133/83 (!) 141/85  Pulse: (!) 130   Resp: (!) 28 19  Temp: (!) 38.9 C 37.1 C  SpO2: 93%     Last Pain:  Vitals:   06/07/19 2017  TempSrc: Oral  PainSc:                  [name]

## 2019-06-07 NOTE — Anesthesia Preprocedure Evaluation (Signed)
Anesthesia Evaluation  Patient identified by MRN, date of birth, ID band Patient awake    Reviewed: Allergy & Precautions, NPO status , Patient's Chart, lab work & pertinent test results  Airway Mallampati: III  TM Distance: >3 FB Neck ROM: Full    Dental  (+) Teeth Intact, Dental Advisory Given   Pulmonary     + decreased breath sounds      Cardiovascular  Rhythm:Regular Rate:Tachycardia     Neuro/Psych    GI/Hepatic   Endo/Other  diabetes  Renal/GU      Musculoskeletal   Abdominal (+) + obese,   Peds  Hematology   Anesthesia Other Findings   Reproductive/Obstetrics                             Anesthesia Physical Anesthesia Plan  ASA: III  Anesthesia Plan: General   Post-op Pain Management:    Induction: Intravenous  PONV Risk Score and Plan: Ondansetron and Dexamethasone  Airway Management Planned: Oral ETT  Additional Equipment:   Intra-op Plan:   Post-operative Plan: Extubation in OR  Informed Consent: I have reviewed the patients History and Physical, chart, labs and discussed the procedure including the risks, benefits and alternatives for the proposed anesthesia with the patient or authorized representative who has indicated his/her understanding and acceptance.     Dental advisory given  Plan Discussed with: CRNA and Anesthesiologist  Anesthesia Plan Comments: (Acute cholecystitis Type 2 DM glucose 197 Morbid obesity  Plan GA with RSI will give 5u Novolog insulin SubQ now.  Kipp Brood)        Anesthesia Quick Evaluation

## 2019-06-07 NOTE — Transfer of Care (Signed)
Immediate Anesthesia Transfer of Care Note  Patient: Gregory Owens [facility]  Procedure(s) Performed: LAPAROSCOPIC CHOLECYSTECTOMY (N/A )  Patient Location: PACU  Anesthesia Type:General  Level of Consciousness: drowsy  Airway & Oxygen Therapy: Patient Spontanous Breathing and Patient connected to face mask oxygen  Post-op Assessment: Report given to RN and Post -op Vital signs reviewed and stable  Post vital signs: Reviewed and stable  Last Vitals:  Vitals Value Taken Time  BP 122/60 06/07/19 1210  Temp    Pulse 138 06/07/19 1215  Resp 27 06/07/19 1215  SpO2 99 % 06/07/19 1215  Vitals shown include unvalidated device data.  Last Pain:  Vitals:   06/07/19 1210  TempSrc:   PainSc: (P) 0-No pain         Complications: No apparent anesthesia complications

## 2019-06-08 LAB — COMPREHENSIVE METABOLIC PANEL
ALT: 45 U/L — ABNORMAL HIGH (ref 0–44)
AST: 41 U/L (ref 15–41)
Albumin: 2.8 g/dL — ABNORMAL LOW (ref 3.5–5.0)
Alkaline Phosphatase: 50 U/L (ref 38–126)
Anion gap: 9 (ref 5–15)
BUN: 12 mg/dL (ref 6–20)
CO2: 29 mmol/L (ref 22–32)
Calcium: 8.1 mg/dL — ABNORMAL LOW (ref 8.9–10.3)
Chloride: 99 mmol/L (ref 98–111)
Creatinine, Ser: 1.08 mg/dL (ref 0.61–1.24)
GFR calc Af Amer: >60 mL/min (ref >60)
GFR calc non Af Amer: >60 mL/min (ref >60)
Glucose, Bld: 186 mg/dL — ABNORMAL HIGH (ref 70–99)
Potassium: 3.4 mmol/L — ABNORMAL LOW (ref 3.5–5.1)
Sodium: 137 mmol/L (ref 135–145)
Total Bilirubin: 0.9 mg/dL (ref 0.3–1.2)
Total Protein: 6.6 g/dL (ref 6.5–8.1)

## 2019-06-08 LAB — GLUCOSE, CAPILLARY
Glucose-Capillary: 191 mg/dL — ABNORMAL HIGH (ref 70–99)
Glucose-Capillary: 134 mg/dL — ABNORMAL HIGH (ref 70–99)
Glucose-Capillary: 141 mg/dL — ABNORMAL HIGH (ref 70–99)
Glucose-Capillary: 154 mg/dL — ABNORMAL HIGH (ref 70–99)
Glucose-Capillary: 120 mg/dL — ABNORMAL HIGH (ref 70–99)
Glucose-Capillary: 108 mg/dL — ABNORMAL HIGH (ref 70–99)
Glucose-Capillary: 103 mg/dL — ABNORMAL HIGH (ref 70–99)

## 2019-06-08 MED ORDER — METFORMIN HCL ER 500 MG PO TB24
1000.0000 mg | ORAL_TABLET | Freq: Two times a day (BID) | ORAL | Status: DC
  Administered 2019-06-09 – 2019-06-11 (×5): 1000 mg via ORAL
  Filled 2019-06-08 (×6): qty 2

## 2019-06-08 MED ORDER — LORATADINE 10 MG PO TABS
10.0000 mg | ORAL_TABLET | Freq: Every day | ORAL | Status: DC
  Administered 2019-06-08 – 2019-06-11 (×4): 10 mg via ORAL
  Filled 2019-06-08 (×4): qty 1

## 2019-06-08 MED ORDER — METFORMIN HCL ER 500 MG PO TB24: 1000.0000 mg | ORAL_TABLET | Freq: Two times a day (BID) | ORAL | Status: DC

## 2019-06-08 MED ORDER — VENLAFAXINE HCL ER 75 MG PO CP24
150.0000 mg | ORAL_CAPSULE | Freq: Every day | ORAL | Status: DC
  Administered 2019-06-08 – 2019-06-11 (×4): 150 mg via ORAL
  Filled 2019-06-08 (×4): qty 2

## 2019-06-08 MED ORDER — MORPHINE SULFATE (PF) 2 MG/ML IV SOLN: 1.0000 mg | INTRAVENOUS | Status: DC | PRN

## 2019-06-08 NOTE — Progress Notes (Signed)
Patient ID: Gregory Owens, male   DOB: Jun 20, 1970, 49 y.o.   MRN: [id number]    1 Day Post-Op  Subjective: Patient with a lot of flatus.  No nausea with CLD.  desats in high 80s when O2 off.  SOB secondary to pain with breathing.  Voiding well.  Pain seems well controlled.  Wants his effexor.  ROS: See above, otherwise other systems negative  Objective: Vital signs in last 24 hours: Temp:  [98.1 F (36.7 C)-102.7 F (39.3 C)] 98.4 F (36.9 C) (05/10 0747) Pulse Rate:  [104-143] 114 (05/10 0747) Resp:  [19-39] 20 (05/10 0747) BP: (108-178)/(43-91) 147/91 (05/10 0747) SpO2:  [86 %-100 %] 96 % (05/10 0747) Last BM Date: 06/06/19  Intake/Output from previous day: 05/09 0701 - 05/10 0700 In: 4204.4 [P.O.:120; I.V.:3009.4; IV Piggyback:1075] Out: 580 [Urine:450; Drains:80; Blood:50] Intake/Output this shift: No intake/output data recorded.  PE: Heart: regular, but tachy Lungs: CTAB, O2 in place.  2L sats in low to mid 90s Abd: soft, appropriately tender, obese, incisions c/d/i, JP drain with serosang output, +BS  Lab Results:  Recent Labs    06/06/19 0211 06/06/19 2154  WBC 16.6* 23.3*  HGB 15.1 15.0  HCT 47.7 48.1  PLT 318 305   BMET Recent Labs    06/06/19 0211 06/06/19 2154  NA 138 135  K 3.6 3.9  CL 98 97*  CO2 26 24  GLUCOSE 227* 245*  BUN 13 10  CREATININE 1.06 0.89  CALCIUM 9.4 9.4   PT/INR No results for input(s): LABPROT, INR in the last 72 hours. CMP     Component Value Date/Time   NA 135 06/06/2019 2154   K 3.9 06/06/2019 2154   CL 97 (L) 06/06/2019 2154   CO2 24 06/06/2019 2154   GLUCOSE 245 (H) 06/06/2019 2154   BUN 10 06/06/2019 2154   CREATININE 0.89 06/06/2019 2154   CALCIUM 9.4 06/06/2019 2154   PROT 8.0 06/06/2019 2154   ALBUMIN 3.7 06/06/2019 2154   AST 19 06/06/2019 2154   ALT 19 06/06/2019 2154   ALKPHOS 63 06/06/2019 2154   BILITOT 1.2 06/06/2019 2154   GFRNONAA >60 06/06/2019 2154   GFRAA >60 06/06/2019 2154    Lipase     Component Value Date/Time   LIPASE 37 06/06/2019 2154       Studies/Results: CT ABDOMEN PELVIS W CONTRAST  Result Date: 06/07/2019 CLINICAL DATA:  49 year old male with right-sided abdominal pain. EXAM: CT ABDOMEN AND PELVIS WITH CONTRAST TECHNIQUE: Multidetector CT imaging of the abdomen and pelvis was performed using the standard protocol following bolus administration of intravenous contrast. CONTRAST:  166mL OMNIPAQUE IOHEXOL 300 MG/ML  SOLN COMPARISON:  Abdominal ultrasound dated 06/06/2019. FINDINGS: Lower chest: Minimal bibasilar dependent atelectasis. The visualized lung bases are otherwise clear. No intra-abdominal free air or free fluid. Hepatobiliary: Apparent fatty infiltration of the liver. No intrahepatic biliary ductal dilatation. There is a small stone in the neck of the gallbladder. The gallbladder wall appears thickened and edematous most concerning for acute cholecystitis. Clinical correlation recommended. Repeat ultrasound or a HIDA scan may provide better evaluation. Pancreas: Unremarkable. No pancreatic ductal dilatation or surrounding inflammatory changes. Spleen: Normal in size without focal abnormality. Adrenals/Urinary Tract: The adrenal glands are unremarkable. There is no hydronephrosis on either side. The visualized ureters appear unremarkable. The urinary bladder is collapsed. Stomach/Bowel: Sigmoid diverticulosis without active inflammatory changes. There is no bowel obstruction or active inflammation. The appendix is normal. Vascular/Lymphatic: The abdominal aorta and IVC are unremarkable. No  portal venous gas. There is no adenopathy. Reproductive: The prostate and seminal vesicles are grossly unremarkable. No pelvic masses Other: Small fat containing umbilical hernia. Musculoskeletal: No acute or significant osseous findings. IMPRESSION: 1. Cholelithiasis with findings most concerning for acute cholecystitis. Clinical correlation recommended. Repeat  ultrasound or a HIDA scan may provide better evaluation. 2. Fatty liver. 3. Sigmoid diverticulosis. No bowel obstruction. Normal appendix. Electronically Signed   By: Elgie Collard M.D.   On: 06/07/2019 00:16    Anti-infectives: Anti-infectives (From admission, onward)   Start     Dose/Rate Route Frequency Ordered Stop   06/07/19 2200  piperacillin-tazobactam (ZOSYN) IVPB 3.375 g     3.375 g 12.5 mL/hr over 240 Minutes Intravenous Every 8 hours 06/07/19 1612 06/08/19 2159   06/07/19 0800  piperacillin-tazobactam (ZOSYN) IVPB 3.375 g  Status:  Discontinued     3.375 g 12.5 mL/hr over 240 Minutes Intravenous Every 8 hours 06/07/19 0203 06/07/19 1612   06/07/19 0030  piperacillin-tazobactam (ZOSYN) IVPB 3.375 g     3.375 g 100 mL/hr over 30 Minutes Intravenous  Once 06/07/19 0021 06/07/19 0118       Assessment/Plan DM - restart metformin tomorrow a full 48 hrs after IV contrast, SSI for now Anxiety - restart effexor  POD 1, s/p lap partial cholecystectomy for Cholecystitis, Dr. Cliffton Asters 5/9 -febrile overnight in 102s -pain relatively well controlled -will adv diet today because he has good bowel function, but monitor -cont IV abx -recheck labs today.  Last WBC 23K -follow O2 sats.  Likely low currently due to decrease inspiration secondary to pain/body habitus -MOBILIZE!!!! -remain inpatient for ongoing treatment of infection s/p lap chole  FEN - CM diet VTE - Lovenox to likely start after labs back ID - zosyn   LOS: 1 day    Letha Cape , [facility] Surgery 06/08/2019, 9:33 AM Please see Amion for pager number during day hours 7:00am-4:30pm or 7:00am -11:30am on weekends

## 2019-06-09 ENCOUNTER — Ambulatory Visit: Payer: BC Managed Care – PPO | Admitting: Internal Medicine

## 2019-06-09 LAB — COMPREHENSIVE METABOLIC PANEL
ALT: 42 U/L (ref 0–44)
AST: 30 U/L (ref 15–41)
Albumin: 2.8 g/dL — ABNORMAL LOW (ref 3.5–5.0)
Alkaline Phosphatase: 52 U/L (ref 38–126)
Anion gap: 10 (ref 5–15)
BUN: 10 mg/dL (ref 6–20)
CO2: 28 mmol/L (ref 22–32)
Calcium: 8.2 mg/dL — ABNORMAL LOW (ref 8.9–10.3)
Chloride: 100 mmol/L (ref 98–111)
Creatinine, Ser: 0.98 mg/dL (ref 0.61–1.24)
GFR calc Af Amer: >60 mL/min (ref >60)
GFR calc non Af Amer: >60 mL/min (ref >60)
Glucose, Bld: 112 mg/dL — ABNORMAL HIGH (ref 70–99)
Potassium: 3.5 mmol/L (ref 3.5–5.1)
Sodium: 138 mmol/L (ref 135–145)
Total Bilirubin: 0.6 mg/dL (ref 0.3–1.2)
Total Protein: 6.4 g/dL — ABNORMAL LOW (ref 6.5–8.1)

## 2019-06-09 LAB — SURGICAL PATHOLOGY

## 2019-06-09 LAB — GLUCOSE, CAPILLARY
Glucose-Capillary: 99 mg/dL (ref 70–99)
Glucose-Capillary: 102 mg/dL — ABNORMAL HIGH (ref 70–99)
Glucose-Capillary: 107 mg/dL — ABNORMAL HIGH (ref 70–99)
Glucose-Capillary: 123 mg/dL — ABNORMAL HIGH (ref 70–99)
Glucose-Capillary: 152 mg/dL — ABNORMAL HIGH (ref 70–99)
Glucose-Capillary: 143 mg/dL — ABNORMAL HIGH (ref 70–99)

## 2019-06-09 LAB — CBC
HCT: 37.9 % — ABNORMAL LOW (ref 39.0–52.0)
Hemoglobin: 12.2 g/dL — ABNORMAL LOW (ref 13.0–17.0)
MCH: 30.8 pg (ref 26.0–34.0)
MCHC: 32.2 g/dL (ref 30.0–36.0)
MCV: 95.7 fL (ref 80.0–100.0)
Platelets: 217 10*3/uL (ref 150–400)
RBC: 3.96 MIL/uL — ABNORMAL LOW (ref 4.22–5.81)
RDW: 12.8 % (ref 11.5–15.5)
WBC: 14.1 10*3/uL — ABNORMAL HIGH (ref 4.0–10.5)
nRBC: 0 % (ref 0.0–0.2)

## 2019-06-09 MED ORDER — HYDROMORPHONE HCL 1 MG/ML IJ SOLN
INTRAMUSCULAR | Status: AC
  Filled 2019-06-09: qty 1

## 2019-06-09 MED ORDER — GABAPENTIN 600 MG PO TABS
300.0000 mg | ORAL_TABLET | Freq: Three times a day (TID) | ORAL | Status: DC
  Administered 2019-06-09 – 2019-06-11 (×6): 300 mg via ORAL
  Filled 2019-06-09 (×5): qty 1

## 2019-06-09 MED ORDER — MORPHINE SULFATE (PF) 2 MG/ML IV SOLN
INTRAVENOUS | Status: AC
  Administered 2019-06-09: 2 mg via INTRAVENOUS
  Filled 2019-06-09: qty 1

## 2019-06-09 MED ORDER — ENOXAPARIN SODIUM 60 MG/0.6ML ~~LOC~~ SOLN
60.0000 mg | SUBCUTANEOUS | Status: DC
  Administered 2019-06-09 – 2019-06-11 (×3): 60 mg via SUBCUTANEOUS
  Filled 2019-06-09 (×3): qty 0.6

## 2019-06-09 MED ORDER — GABAPENTIN 600 MG PO TABS
300.0000 mg | ORAL_TABLET | Freq: Three times a day (TID) | ORAL | Status: DC
  Filled 2019-06-09: qty 1

## 2019-06-09 MED ORDER — MELATONIN 5 MG PO TABS
10.0000 mg | ORAL_TABLET | Freq: Every day | ORAL | Status: DC
  Administered 2019-06-09 – 2019-06-10 (×3): 10 mg via ORAL
  Filled 2019-06-09 (×3): qty 2

## 2019-06-09 MED ORDER — MORPHINE SULFATE (PF) 2 MG/ML IV SOLN: 2.0000 mg | Freq: Once | INTRAVENOUS | Status: AC

## 2019-06-09 MED ORDER — HYDROMORPHONE HCL 1 MG/ML IJ SOLN
0.5000 mg | INTRAMUSCULAR | Status: DC | PRN
  Administered 2019-06-09 – 2019-06-10 (×3): 0.5 mg via INTRAVENOUS
  Filled 2019-06-09 (×2): qty 1

## 2019-06-09 NOTE — Progress Notes (Signed)
Patient ID: Gregory Owens, male   DOB: 1970/11/26, 49 y.o.   MRN: [id number]  Seen socially today. Appreciate surgery's excellent care of [name].  Will see as an outpt and arrange a screening colonoscopy for later this summer.

## 2019-06-09 NOTE — Progress Notes (Signed)
    2 Days Post-Op  Subjective: Had a BM this am.  Feels better today.  Ambulated once yesterday.  No nausea on solid diet.  Voiding well.  O2 sats in low 90s on room air. Improved from yesterday.  Pulls between [phone] on IS today.  ROS: See above, otherwise other systems negative  Objective: Vital signs in last 24 hours: Temp:  [97 F (36.1 C)-98.4 F (36.9 C)] 97.9 F (36.6 C) (05/11 0820) Pulse Rate:  [106-119] 114 (05/10 1643) Resp:  [18-27] 21 (05/11 0820) BP: (134-162)/(84-101) 159/88 (05/11 0820) SpO2:  [90 %-97 %] 90 % (05/11 0820) Last BM Date: 06/06/19  Intake/Output from previous day: 05/10 0701 - 05/11 0700 In: 1895.7 [I.V.:1820.7; IV Piggyback:75] Out: 470 [Urine:450; Drains:20] Intake/Output this shift: No intake/output data recorded.  PE: Heart: regular, mildly tachy, decrease from yesterday Lungs: CTAB Abd: soft, less tender, JP drain with serosang output, incisions c/d/i.  +BS  Lab Results:  Recent Labs    06/06/19 2154 06/09/19 0215  WBC 23.3* 14.1*  HGB 15.0 12.2*  HCT 48.1 37.9*  PLT 305 217   BMET Recent Labs    06/08/19 0953 06/09/19 0215  NA 137 138  K 3.4* 3.5  CL 99 100  CO2 29 28  GLUCOSE 186* 112*  BUN 12 10  CREATININE 1.08 0.98  CALCIUM 8.1* 8.2*   PT/INR No results for input(s): LABPROT, INR in the last 72 hours. CMP     Component Value Date/Time   NA 138 06/09/2019 0215   K 3.5 06/09/2019 0215   CL 100 06/09/2019 0215   CO2 28 06/09/2019 0215   GLUCOSE 112 (H) 06/09/2019 0215   BUN 10 06/09/2019 0215   CREATININE 0.98 06/09/2019 0215   CALCIUM 8.2 (L) 06/09/2019 0215   PROT 6.4 (L) 06/09/2019 0215   ALBUMIN 2.8 (L) 06/09/2019 0215   AST 30 06/09/2019 0215   ALT 42 06/09/2019 0215   ALKPHOS 52 06/09/2019 0215   BILITOT 0.6 06/09/2019 0215   GFRNONAA >60 06/09/2019 0215   GFRAA >60 06/09/2019 0215   Lipase     Component Value Date/Time   LIPASE 37 06/06/2019 2154       Studies/Results: No results  found.  Anti-infectives: Anti-infectives (From admission, onward)   Start     Dose/Rate Route Frequency Ordered Stop   06/07/19 2200  piperacillin-tazobactam (ZOSYN) IVPB 3.375 g     3.375 g 12.5 mL/hr over 240 Minutes Intravenous Every 8 hours 06/07/19 1612 06/08/19 1758   06/07/19 0800  piperacillin-tazobactam (ZOSYN) IVPB 3.375 g  Status:  Discontinued     3.375 g 12.5 mL/hr over 240 Minutes Intravenous Every 8 hours 06/07/19 0203 06/07/19 1612   06/07/19 0030  piperacillin-tazobactam (ZOSYN) IVPB 3.375 g     3.375 g 100 mL/hr over 30 Minutes Intravenous  Once 06/07/19 0021 06/07/19 0118       Assessment/Plan DM - restart metformin today Anxiety - restart effexor  POD 2, s/p lap partial cholecystectomy for Cholecystitis, Dr. Cliffton Asters 5/9 -AF for 24 hrs -pain well controlled -carb mod diet -WBC down to 14K.  Zosyn stopped today -MOBILIZE!!!! -likely plan for DC home tomorrow  FEN - CM diet VTE - Lovenox ID - zosyn stopped last night   LOS: 2 days    Letha Cape , [facility] Surgery 06/09/2019, 8:53 AM Please see Amion for pager number during day hours 7:00am-4:30pm or 7:00am -11:30am on weekends

## 2019-06-10 ENCOUNTER — Inpatient Hospital Stay (HOSPITAL_COMMUNITY): Payer: 59

## 2019-06-10 LAB — CBC
HCT: 40.2 % (ref 39.0–52.0)
Hemoglobin: 12.4 g/dL — ABNORMAL LOW (ref 13.0–17.0)
MCH: 29.7 pg (ref 26.0–34.0)
MCHC: 30.8 g/dL (ref 30.0–36.0)
MCV: 96.2 fL (ref 80.0–100.0)
Platelets: 273 10*3/uL (ref 150–400)
RBC: 4.18 MIL/uL — ABNORMAL LOW (ref 4.22–5.81)
RDW: 13 % (ref 11.5–15.5)
WBC: 13.4 10*3/uL — ABNORMAL HIGH (ref 4.0–10.5)
nRBC: 0 % (ref 0.0–0.2)

## 2019-06-10 LAB — GLUCOSE, CAPILLARY
Glucose-Capillary: 100 mg/dL — ABNORMAL HIGH (ref 70–99)
Glucose-Capillary: 108 mg/dL — ABNORMAL HIGH (ref 70–99)
Glucose-Capillary: 109 mg/dL — ABNORMAL HIGH (ref 70–99)
Glucose-Capillary: 120 mg/dL — ABNORMAL HIGH (ref 70–99)
Glucose-Capillary: 145 mg/dL — ABNORMAL HIGH (ref 70–99)
Glucose-Capillary: 153 mg/dL — ABNORMAL HIGH (ref 70–99)

## 2019-06-10 MED ORDER — METHOCARBAMOL 500 MG PO TABS: 750.0000 mg | ORAL_TABLET | Freq: Three times a day (TID) | ORAL | Status: DC | PRN

## 2019-06-10 MED ORDER — METOPROLOL TARTRATE 5 MG/5ML IV SOLN: 5.0000 mg | Freq: Four times a day (QID) | INTRAVENOUS | Status: DC | PRN

## 2019-06-10 MED ORDER — TECHNETIUM TC 99M MEBROFENIN IV KIT
5.1000 | PACK | Freq: Once | INTRAVENOUS | Status: AC | PRN
  Administered 2019-06-10: 5.1 via INTRAVENOUS

## 2019-06-10 NOTE — Plan of Care (Signed)

## 2019-06-10 NOTE — Progress Notes (Signed)
Patient ID: Gregory Owens, male   DOB: [birth year], 49 y.o.   MRN: [id number]    3 Days Post-Op  Subjective: Patient had a lot of pain last night after walking.  Felt like fireworks in his abdomen.  Much better controlled after returning from walk and getting medication.  Eating well.  +flatus/BM yesterday  ROS: See above, otherwise other systems negative  Objective: Vital signs in last 24 hours: Temp:  [98 F (36.7 C)-98.9 F (37.2 C)] 98.3 F (36.8 C) (05/12 0737) Pulse Rate:  [93-120] 94 (05/12 0715) Resp:  [16-27] 17 (05/12 0715) BP: (145-183)/(89-108) 147/93 (05/12 0737) SpO2:  [90 %-98 %] 97 % (05/12 0715) Last BM Date: 06/09/19  Intake/Output from previous day: 05/11 0701 - 05/12 0700 In: 480 [P.O.:480] Out: 120 [Drains:120] Intake/Output this shift: Total I/O In: 240 [P.O.:240] Out: 400 [Urine:400]  PE: Abd: soft, appropriately tender, JP drain with increase in output to 120cc yesterday and slightly more golden in color today, incisions c/d/i  Lab Results:  Recent Labs    06/09/19 0215 06/10/19 0336  WBC 14.1* 13.4*  HGB 12.2* 12.4*  HCT 37.9* 40.2  PLT 217 273   BMET Recent Labs    06/08/19 0953 06/09/19 0215  NA 137 138  K 3.4* 3.5  CL 99 100  CO2 29 28  GLUCOSE 186* 112*  BUN 12 10  CREATININE 1.08 0.98  CALCIUM 8.1* 8.2*   PT/INR No results for input(s): LABPROT, INR in the last 72 hours. CMP     Component Value Date/Time   NA 138 06/09/2019 0215   K 3.5 06/09/2019 0215   CL 100 06/09/2019 0215   CO2 28 06/09/2019 0215   GLUCOSE 112 (H) 06/09/2019 0215   BUN 10 06/09/2019 0215   CREATININE 0.98 06/09/2019 0215   CALCIUM 8.2 (L) 06/09/2019 0215   PROT 6.4 (L) 06/09/2019 0215   ALBUMIN 2.8 (L) 06/09/2019 0215   AST 30 06/09/2019 0215   ALT 42 06/09/2019 0215   ALKPHOS 52 06/09/2019 0215   BILITOT 0.6 06/09/2019 0215   GFRNONAA >60 06/09/2019 0215   GFRAA >60 06/09/2019 0215   Lipase     Component Value Date/Time    LIPASE 37 06/06/2019 2154       Studies/Results: No results found.  Anti-infectives: Anti-infectives (From admission, onward)   Start     Dose/Rate Route Frequency Ordered Stop   06/07/19 2200  piperacillin-tazobactam (ZOSYN) IVPB 3.375 g     3.375 g 12.5 mL/hr over 240 Minutes Intravenous Every 8 hours 06/07/19 1612 06/08/19 1758   06/07/19 0800  piperacillin-tazobactam (ZOSYN) IVPB 3.375 g  Status:  Discontinued     3.375 g 12.5 mL/hr over 240 Minutes Intravenous Every 8 hours 06/07/19 0203 06/07/19 1612   06/07/19 0030  piperacillin-tazobactam (ZOSYN) IVPB 3.375 g     3.375 g 100 mL/hr over 30 Minutes Intravenous  Once 06/07/19 0021 06/07/19 0118       Assessment/Plan DM - restart metformin today Anxiety - restart effexor  POD 3, s/p lap partial cholecystectomy for Cholecystitis, Dr. Cliffton Asters 5/9 -AF for 24 hrs -pain better controlled -carb mod diet -JP drain with increase in output in last 24 hrs and appears more golden, possible bilious today.  Will check HIDA scan to rule out bile leak -WBC down to 13K  FEN -CM diet VTE -Lovenox ID -zosyn stopped 5/10   LOS: 3 days    Letha Cape , [facility] Surgery 06/10/2019, 8:58 AM Please  see Amion for pager number during day hours 7:00am-4:30pm or 7:00am -11:30am on weekends

## 2019-06-11 LAB — GLUCOSE, CAPILLARY
Glucose-Capillary: 103 mg/dL — ABNORMAL HIGH (ref 70–99)
Glucose-Capillary: 113 mg/dL — ABNORMAL HIGH (ref 70–99)

## 2019-06-11 MED ORDER — OXYCODONE HCL 5 MG PO TABS: 5.0000 mg | ORAL_TABLET | ORAL | 0 refills | Status: DC | PRN

## 2019-06-11 MED ORDER — IBUPROFEN 600 MG PO TABS: 600.0000 mg | ORAL_TABLET | Freq: Three times a day (TID) | ORAL | 0 refills | Status: DC | PRN

## 2019-06-11 MED ORDER — ACETAMINOPHEN 500 MG PO TABS: 1000.0000 mg | ORAL_TABLET | Freq: Four times a day (QID) | ORAL | 0 refills | Status: DC | PRN

## 2019-06-11 MED ORDER — METHOCARBAMOL 750 MG PO TABS: 750.0000 mg | ORAL_TABLET | Freq: Three times a day (TID) | ORAL | 0 refills | Status: DC | PRN

## 2019-06-11 NOTE — Discharge Summary (Addendum)
Patient ID: Gregory Owens [facility] [id number] 02-03-1970 49 y.o.  Admit date: 06/06/2019 Discharge date: 06/11/2019  Admitting Diagnosis: Cholecystitis DM HTN anxiety  Discharge Diagnosis Patient Active Problem List   Diagnosis Date Noted  . Cholecystitis 06/07/2019  . S/P laparoscopic cholecystectomy 06/07/2019  . Anxiety 02/26/2019  . Headache 02/08/2019  . Urinary urgency 11/12/2018  . Possible exposure to STD 10/16/2017  . Hypogonadism in male 09/04/2017  . Bilateral foot pain 03/08/2017  . Dysphagia 08/29/2016  . ADHD 03/28/2016  . Morbid obesity ([place]) 12/06/2015  . Preventative health care 03/10/2015  . Skin lesion 03/10/2015  . GERD (gastroesophageal reflux disease) 11/04/2013  . Tachycardia 11/04/2013  . Nonspecific abnormal electrocardiogram (ECG) (EKG) 11/04/2013  . Non-compliant behavior 10/14/2013  . Uncontrolled diabetes mellitus ([place]) 08/19/2012  . PLMD (periodic limb movement disorder) 05/30/2012  . SLEEP DISORDER 12/05/2009  . Hyperlipidemia 08/25/2009  . GAD (generalized anxiety disorder) 08/25/2009  . Obstructive sleep apnea 08/25/2009  . RHINITIS 05/24/2009  s/p lap chole  Consultants none  Reason for Admission: 49 year old male with history of diabetes presented to the emergency department with epigastric pain about 24 hours ago.  He was worked up and found to have gallstones but no evidence of cholecystitis and was discharged.  He ate [name] later yesterday and had recurrence of his pain.  He returned to the emergency department and CT scan was performed showing cholecystitis.  White blood cell count is elevated 23,300.  Liver function tests are normal.  I was asked to admit him.  Procedures Laparoscopic subtotal cholecystectomy, Dr. Dema Severin 06/07/19  Hospital Course:  The patient was admitted and started on zosyn.  He was taken to the OR for the above procedure the following day.  The patient became tachycardic and SIRs response from his  gallbladder infection.  He was sent to a progressive floor post op.  He was continued on zosyn for 48 hrs post op and then this was discontinued.  His WBC normalized as did his HR improve and his fevers resolved.  His diet was able to be advanced as tolerates and he was mobilized.  On POD 3, his JP drain increased in output to 120cc and appeared slightly more golden in color.  He was sent for a HIDA scan which was negative.  The following day, his JP output decreases and was serous in nature.  He was stable at this time, but was discharged with his JP drain in place and he will return to the office early next week for removal pending no bile present in the drain.  He otherwise doing well and stable for DC home.  Physical Exam: Heart: regular, minimally tachy Lungs: CTAB Abd: soft, appropriately tender, +BS, incisions are c/d/i, JP with minimal serous drainage  Allergies as of 06/11/2019   No Known Allergies     Medication List    TAKE these medications   acetaminophen 500 MG tablet Commonly known as: TYLENOL Take 2 tablets (1,000 mg total) by mouth every 6 (six) hours as needed.   ALIGN PO Take 1 capsule by mouth daily.   aspirin 81 MG EC tablet TAKE 1 TABLET (81 MG TOTAL) BY MOUTH DAILY.   Bayer Contour Next Monitor w/Device Kit Use as directed to check blood sugars daily Dx e11.9   Bayer Microlet Lancets lancets Use to help check blood sugar daily Dx E11.9   cetirizine 10 MG tablet Commonly known as: ZYRTEC Take 10 mg by mouth daily.   Effexor XR 150  MG 24 hr capsule Generic drug: venlafaxine XR TAKE 1 CAPSULE BY MOUTH EVERY DAY What changed: how much to take   famotidine 40 MG tablet Commonly known as: PEPCID Take 1 tablet (40 mg total) by mouth at bedtime.   glucose blood test strip Use as instructed twice a day E 11.9 Contour Next Strips   ibuprofen 600 MG tablet Commonly known as: ADVIL Take 1 tablet (600 mg total) by mouth every 8 (eight) hours as needed for  fever or mild pain.   ketoconazole 2 % shampoo Commonly known as: NIZORAL Apply 1 application topically 2 (two) times a week.   losartan 25 MG tablet Commonly known as: COZAAR Take 1 tablet by mouth once daily   metFORMIN 500 MG 24 hr tablet Commonly known as: GLUCOPHAGE-XR TAKE 4 TABLETS BY MOUTH ONCE DAILY WITH BREAKFAST What changed:   how much to take  how to take this  when to take this  additional instructions   methocarbamol 750 MG tablet Commonly known as: ROBAXIN Take 1 tablet (750 mg total) by mouth every 8 (eight) hours as needed for muscle spasms.   ondansetron 4 MG disintegrating tablet Commonly known as: Zofran ODT Take 1 tablet (4 mg total) by mouth every 8 (eight) hours as needed for nausea or vomiting.   oxyCODONE 5 MG immediate release tablet Commonly known as: Oxy IR/ROXICODONE Take 1 tablet (5 mg total) by mouth every 4 (four) hours as needed for moderate pain.   pantoprazole 40 MG tablet Commonly known as: PROTONIX Take 1 tablet (40 mg total) by mouth daily.   rosuvastatin 10 MG tablet Commonly known as: CRESTOR Take 1 tablet (10 mg total) by mouth daily.   solifenacin 5 MG tablet Commonly known as: VESIcare Take 1 tablet (5 mg total) by mouth daily.   sucralfate 1 g tablet Commonly known as: Carafate Take 1 tablet (1 g total) by mouth 4 (four) times daily -  with meals and at bedtime.   testosterone cypionate 200 MG/ML injection Commonly known as: DEPOTESTOSTERONE CYPIONATE Inject 1 mL (200 mg total) into the muscle every 14 (fourteen) days.   Vitamin D (Ergocalciferol) 1.25 MG (50000 UNIT) Caps capsule Commonly known as: DRISDOL Take 1 capsule (50,000 Units total) by mouth every 7 (seven) days.        Follow-up Information    [facility] [place] Follow up on 06/16/2019.   Specialty: General Surgery Why: 10:00am , arrive by 9:30am for paperwork and check in process.  This is your nurse visit for JP drain removal. Contact  information: [place] [address] [phone]        Carlena Hurl, PA-C Follow up on 06/30/2019.   Specialty: General Surgery Why: 9:45am, arrive by 9:30 for check in Contact information: [address] [place] [address] [phone]           Signed: Saverio Danker, [facility] Surgery 06/11/2019, 9:17 AM Please see Amion for pager number during day hours 7:00am-4:30pm, 7-11:30am on Weekends

## 2019-06-11 NOTE — Discharge Instructions (Signed)
[facility]  Please arrive at least 30 min before your appointment to complete your check in paperwork.  If you are unable to arrive 30 min prior to your appointment time we may have to cancel or reschedule you. LAPAROSCOPIC SURGERY: POST OP INSTRUCTIONS Always review your discharge instruction sheet given to you by the facility where your surgery was performed. IF YOU HAVE DISABILITY OR FAMILY LEAVE FORMS, YOU MUST BRING THEM TO THE OFFICE FOR PROCESSING.   DO NOT GIVE THEM TO YOUR DOCTOR.  PAIN CONTROL  1. First take acetaminophen (Tylenol) AND/or ibuprofen (Advil) to control your pain after surgery.  Follow directions on package.  Taking acetaminophen (Tylenol) and/or ibuprofen (Advil) regularly after surgery will help to control your pain and lower the amount of prescription pain medication you may need.  You should not take more than 4,000 mg (4 grams) of acetaminophen (Tylenol) in 24 hours.  You should not take ibuprofen (Advil), aleve, motrin, naprosyn or other NSAIDS if you have a history of stomach ulcers or chronic kidney disease.  2. A prescription for pain medication may be given to you upon discharge.  Take your pain medication as prescribed, if you still have uncontrolled pain after taking acetaminophen (Tylenol) or ibuprofen (Advil). 3. Use ice packs to help control pain. 4. If you need a refill on your pain medication, please contact your pharmacy.  They will contact our office to request authorization. Prescriptions will not be filled after 5pm or on week-ends.  HOME MEDICATIONS 5. Take your usually prescribed medications unless otherwise directed.  DIET 6. You should follow a light diet the first few days after arrival home.  Be sure to include lots of fluids daily. Avoid fatty, fried foods.   CONSTIPATION 7. It is common to experience some constipation after surgery and if you are taking pain medication.  Increasing fluid intake and taking a stool  softener (such as Colace) will usually help or prevent this problem from occurring.  A mild laxative (Milk of Magnesia or Miralax) should be taken according to package instructions if there are no bowel movements after 48 hours.  WOUND/INCISION CARE 8. Most patients will experience some swelling and bruising in the area of the incisions.  Ice packs will help.  Swelling and bruising can take several days to resolve.  9. Unless discharge instructions indicate otherwise, follow guidelines below  a. STERI-STRIPS - you may remove your outer bandages 48 hours after surgery, and you may shower at that time.  You have steri-strips (small skin tapes) in place directly over the incision.  These strips should be left on the skin for 7-10 days.   b. DERMABOND/SKIN GLUE - you may shower in 24 hours.  The glue will flake off over the next 2-3 weeks. 10. Any sutures or staples will be removed at the office during your follow-up visit.  ACTIVITIES 11. You may resume regular (light) daily activities beginning the next day--such as daily self-care, walking, climbing stairs--gradually increasing activities as tolerated.  You may have sexual intercourse when it is comfortable.  Refrain from any heavy lifting or straining until approved by your doctor. a. You may drive when you are no longer taking prescription pain medication, you can comfortably wear a seatbelt, and you can safely maneuver your car and apply brakes.  FOLLOW-UP 12. You should see your doctor in the office for a follow-up appointment approximately 2-3 weeks after your surgery.  You should have been given your post-op/follow-up appointment when   your surgery was scheduled.  If you did not receive a post-op/follow-up appointment, make sure that you call for this appointment within a day or two after you arrive home to insure a convenient appointment time.   WHEN TO CALL YOUR DOCTOR: 1. Fever over 101.0 2. Inability to urinate 3. Continued bleeding from  incision. 4. Increased pain, redness, or drainage from the incision. 5. Increasing abdominal pain  The clinic staff is available to answer your questions during regular business hours.  Please don't hesitate to call and ask to speak to one of the nurses for clinical concerns.  If you have a medical emergency, go to the nearest emergency room or call 911.  A surgeon from [facility] is always on call at the hospital. [address], [address], [place], [place]  [address] ? [address] [address], , [place]   [address] [phone] ? [phone] ? FAX [phone]  .........   Managing Your Pain After Surgery Without Opioids    Thank you for participating in our program to help patients manage their pain after surgery without opioids. This is part of our effort to provide you with the best care possible, without exposing you or your family to the risk that opioids pose.  What pain can I expect after surgery? You can expect to have some pain after surgery. This is normal. The pain is typically worse the day after surgery, and quickly begins to get better. Many studies have found that many patients are able to manage their pain after surgery with Over-the-Counter (OTC) medications such as Tylenol and Motrin. If you have a condition that does not allow you to take Tylenol or Motrin, notify your surgical team.  How will I manage my pain? The best strategy for controlling your pain after surgery is around the clock pain control with Tylenol (acetaminophen) and Motrin (ibuprofen or Advil). Alternating these medications with each other allows you to maximize your pain control. In addition to Tylenol and Motrin, you can use heating pads or ice packs on your incisions to help reduce your pain.  How will I alternate your regular strength over-the-counter pain medication? You will take a dose of pain medication every three hours. ; Start by taking 650 mg of Tylenol (2 pills of 325  mg) ; 3 hours later take 600 mg of Motrin (3 pills of 200 mg) ; 3 hours after taking the Motrin take 650 mg of Tylenol ; 3 hours after that take 600 mg of Motrin.   - 1 -  See example - if your first dose of Tylenol is at 12:00 PM   12:00 PM Tylenol 650 mg (2 pills of 325 mg)  3:00 PM Motrin 600 mg (3 pills of 200 mg)  6:00 PM Tylenol 650 mg (2 pills of 325 mg)  9:00 PM Motrin 600 mg (3 pills of 200 mg)  Continue alternating every 3 hours   We recommend that you follow this schedule around-the-clock for at least 3 days after surgery, or until you feel that it is no longer needed. Use the table on the last page of this handout to keep track of the medications you are taking. Important: Do not take more than 3000mg of Tylenol or 3200mg of Motrin in a 24-hour period. Do not take ibuprofen/Motrin if you have a history of bleeding stomach ulcers, severe kidney disease, &/or actively taking a blood thinner  What if I still have pain? If you have pain that is not   controlled with the over-the-counter pain medications (Tylenol and Motrin or Advil) you might have what we call "breakthrough" pain. You will receive a prescription for a small amount of an opioid pain medication such as Oxycodone, Tramadol, or Tylenol with Codeine. Use these opioid pills in the first 24 hours after surgery if you have breakthrough pain. Do not take more than 1 pill every 4-6 hours.  If you still have uncontrolled pain after using all opioid pills, don't hesitate to call our staff using the number provided. We will help make sure you are managing your pain in the best way possible, and if necessary, we can provide a prescription for additional pain medication.   Day 1    Time  Name of Medication Number of pills taken  Amount of Acetaminophen  Pain Level   Comments  AM PM       AM PM       AM PM       AM PM       AM PM       AM PM       AM PM       AM PM       Total Daily amount of Acetaminophen Do not  take more than  3,000 mg per day      Day 2    Time  Name of Medication Number of pills taken  Amount of Acetaminophen  Pain Level   Comments  AM PM       AM PM       AM PM       AM PM       AM PM       AM PM       AM PM       AM PM       Total Daily amount of Acetaminophen Do not take more than  3,000 mg per day      Day 3    Time  Name of Medication Number of pills taken  Amount of Acetaminophen  Pain Level   Comments  AM PM       AM PM       AM PM       AM PM          AM PM       AM PM       AM PM       AM PM       Total Daily amount of Acetaminophen Do not take more than  3,000 mg per day      Day 4    Time  Name of Medication Number of pills taken  Amount of Acetaminophen  Pain Level   Comments  AM PM       AM PM       AM PM       AM PM       AM PM       AM PM       AM PM       AM PM       Total Daily amount of Acetaminophen Do not take more than  3,000 mg per day      Day 5    Time  Name of Medication Number of pills taken  Amount of Acetaminophen  Pain Level   Comments  AM PM       AM PM       AM   PM       AM PM       AM PM       AM PM       AM PM       AM PM       Total Daily amount of Acetaminophen Do not take more than  3,000 mg per day       Day 6    Time  Name of Medication Number of pills taken  Amount of Acetaminophen  Pain Level  Comments  AM PM       AM PM       AM PM       AM PM       AM PM       AM PM       AM PM       AM PM       Total Daily amount of Acetaminophen Do not take more than  3,000 mg per day      Day 7    Time  Name of Medication Number of pills taken  Amount of Acetaminophen  Pain Level   Comments  AM PM       AM PM       AM PM       AM PM       AM PM       AM PM       AM PM       AM PM       Total Daily amount of Acetaminophen Do not take more than  3,000 mg per day        For additional information about how and where to safely dispose of unused  opioid medications - https://www.morepowerfulnc.org  Disclaimer: This document contains information and/or instructional materials adapted from [facility] for the typical patient with your condition. It does not replace medical advice from your health care provider because your experience may differ from that of the typical patient. Talk to your health care provider if you have any questions about this document, your condition or your treatment plan. Adapted from [facility]      JP Drain Totals  Bring this sheet to all of your post-operative appointments while you have your drains.  Please measure your drains by CC's or ML's.  Make sure you drain and measure your JP Drains 3 times per day.  At the end of each day, add up totals for the left side and add up totals for the right side.    ( 9 am )     ( 3 pm )        ( 9 pm )                Date L  R  L  R  L  R  Total L/R                                                                                                                                                                                       

## 2019-06-11 NOTE — Progress Notes (Signed)
Patient was stable at discharge. I removed their IV. We reviewed the discharge education. Patient and family verbalized understanding and had no further questions. Wife demonstrated emptying and recharging pt's JP drain.  Patient left with belongings in hand.

## 2019-06-16 ENCOUNTER — Encounter: Payer: Self-pay | Admitting: Internal Medicine

## 2019-06-16 IMAGING — RF DG ESOPHAGUS
6 series · 14 of 20 positions shown · non-contrast
Comparison: None.

CLINICAL DATA: Epigastric abdominal pain, symptoms of reflux

EXAM:
ESOPHOGRAM / BARIUM SWALLOW / BARIUM TABLET STUDY
TECHNIQUE: Combined double contrast and single contrast examination performed
using effervescent crystals, thick barium liquid, and thin barium
liquid. The patient was observed with fluoroscopy swallowing a 13 mm
barium sulphate tablet.
FLUOROSCOPY TIME:  Fluoroscopy Time:  1 minutes 18 second
Radiation Exposure Index (if provided by the fluoroscopic device):
291 mGy
Number of Acquired Spot Images: 0

[Series 1: sequence · 2 of 8 frames shown (1 of 4)]
[frame 2/8]
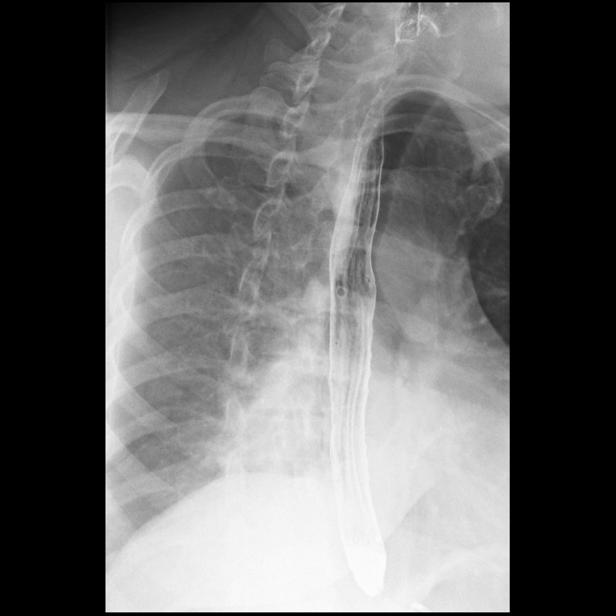
[frame 7/8]
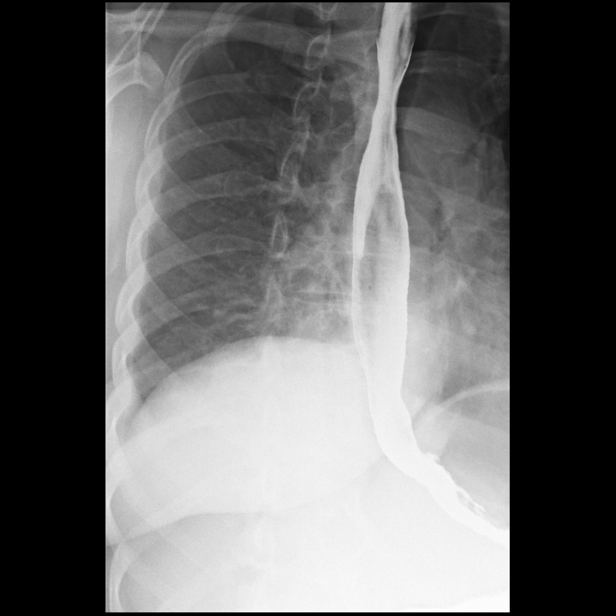

[Series 2: sequence · 3 of 24 frames shown (2 of 4)]
[frame 4/24]
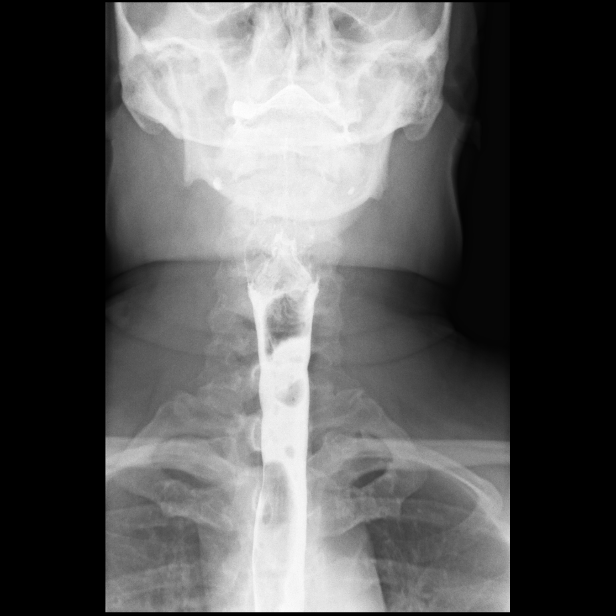
[frame 15/24]
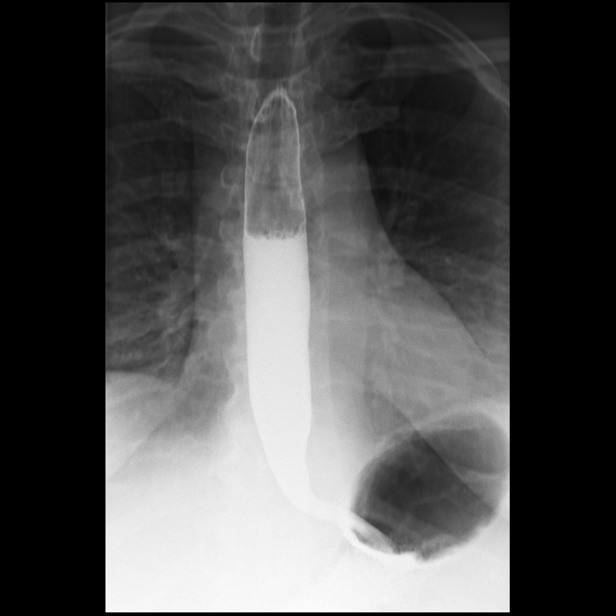
[frame 21/24]
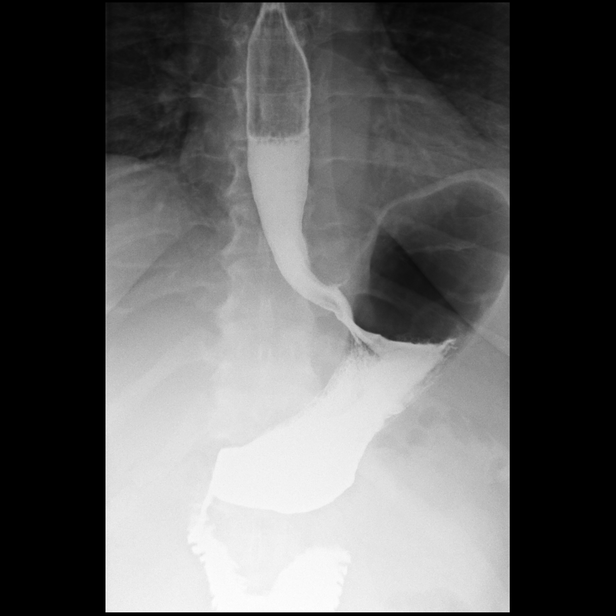

[Series 3: sequence · 3 of 12 frames shown (3 of 4)]
[frame 2/12]
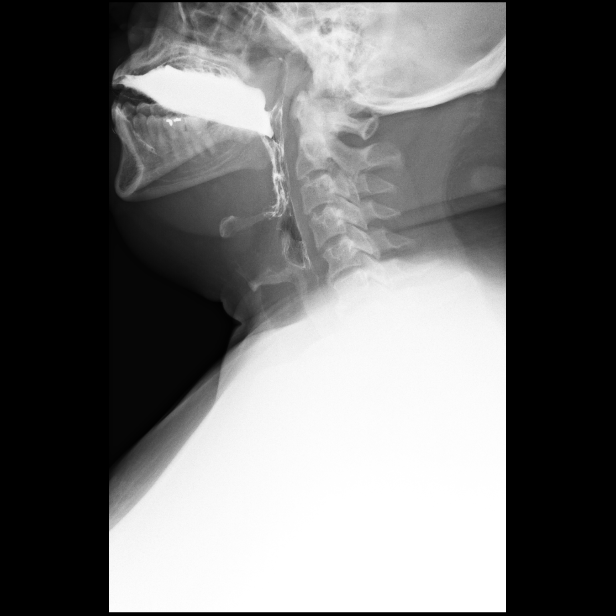
[frame 7/12]
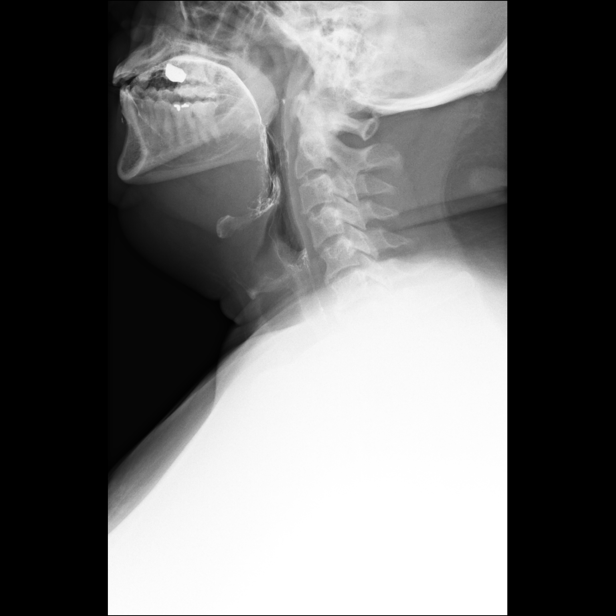
[frame 11/12]
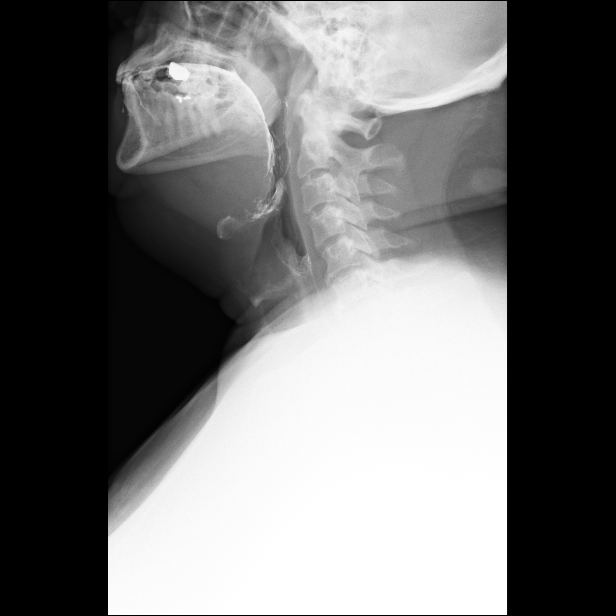

[Series 4: one shot · 2 of 4 slices shown (1 of 2)]
[im 2/4]
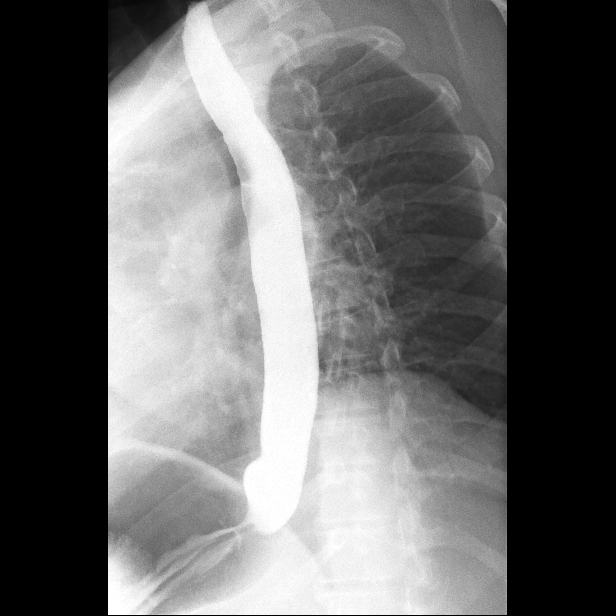
[im 3/4]
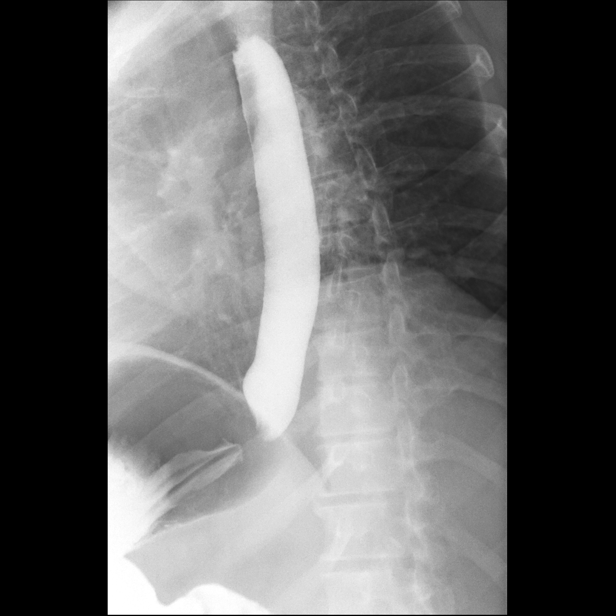

[Series 5: sequence · 3 of 50 frames shown (4 of 4)]
[frame 8/50]
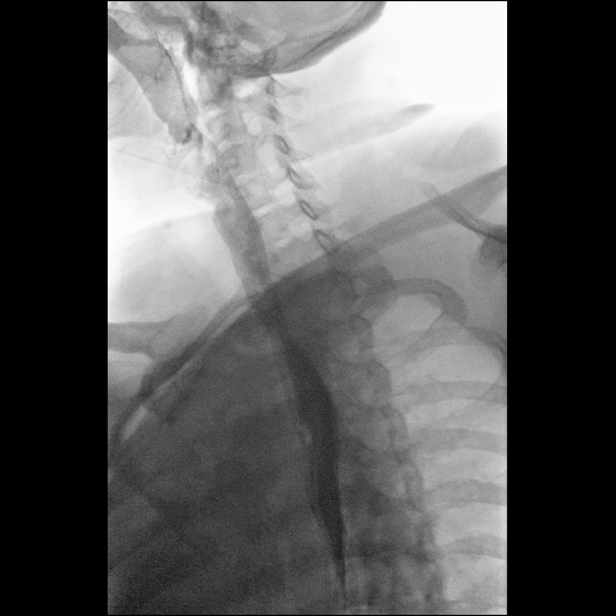
[frame 14/50]
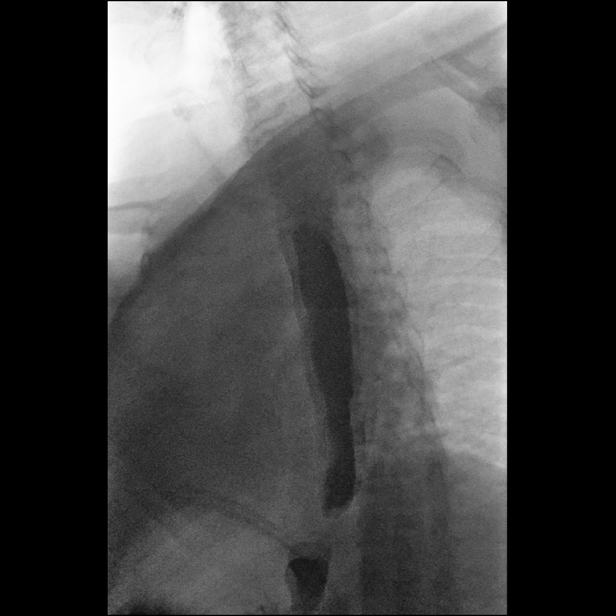
[frame 26/50]
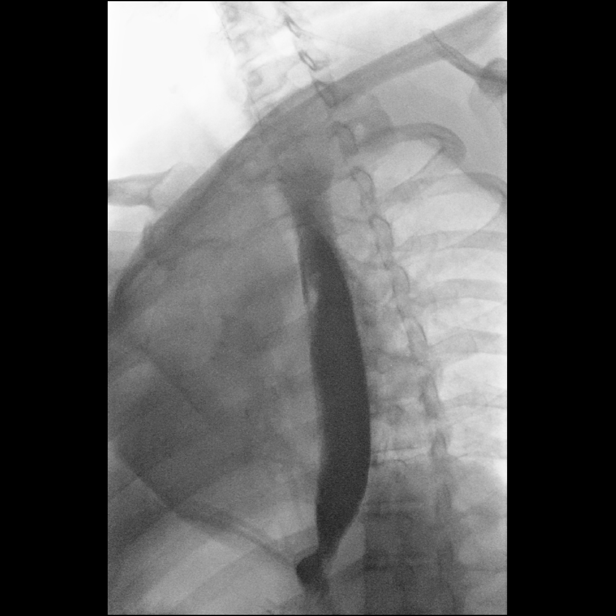

[Series 6: one shot · 1 of 1 slices shown (2 of 2)]
[im 1/1]
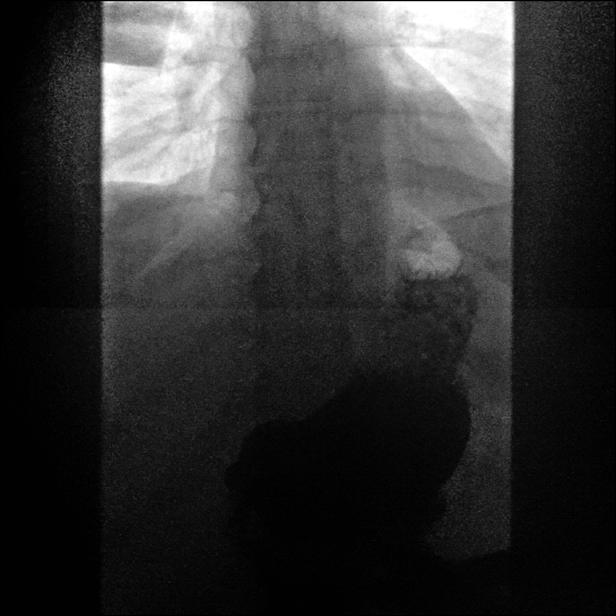

[14 of 20 positions shown; findings below may reference images not displayed]

FINDINGS: Initially a double-contrast barium swallow was performed. The mucosa
of the esophagus appears normal. Rapid sequence spot films of the
cervical esophagus show the swallowing mechanism to be normal. No
penetration or aspiration is seen. Esophageal peristalsis is normal.
No hiatal hernia is seen. No gastroesophageal reflux is demonstrated
with the water siphon maneuver. A barium pill was given at the end
of the study which passed into the stomach without delay.
IMPRESSION: Negative double-contrast barium swallow.

## 2019-06-25 ENCOUNTER — Ambulatory Visit: Payer: 59 | Admitting: Internal Medicine

## 2019-06-25 DIAGNOSIS — Z0289 Encounter for other administrative examinations: Secondary | ICD-10-CM

## 2019-07-09 ENCOUNTER — Other Ambulatory Visit: Payer: Self-pay

## 2019-07-09 ENCOUNTER — Ambulatory Visit (INDEPENDENT_AMBULATORY_CARE_PROVIDER_SITE_OTHER): Payer: 59 | Admitting: Internal Medicine

## 2019-07-09 ENCOUNTER — Encounter: Payer: Self-pay | Admitting: Internal Medicine

## 2019-07-09 VITALS — BP 130/80 | HR 98 | Temp 98.8°F | Ht 68.0 in | Wt 256.0 lb

## 2019-07-09 DIAGNOSIS — E785 Hyperlipidemia, unspecified: Secondary | ICD-10-CM | POA: Diagnosis not present

## 2019-07-09 DIAGNOSIS — E291 Testicular hypofunction: Secondary | ICD-10-CM | POA: Diagnosis not present

## 2019-07-09 DIAGNOSIS — E1165 Type 2 diabetes mellitus with hyperglycemia: Secondary | ICD-10-CM

## 2019-07-09 DIAGNOSIS — K219 Gastro-esophageal reflux disease without esophagitis: Secondary | ICD-10-CM

## 2019-07-09 DIAGNOSIS — K921 Melena: Secondary | ICD-10-CM | POA: Insufficient documentation

## 2019-07-09 LAB — CBC WITH DIFFERENTIAL/PLATELET
Basophils Absolute: 0 10*3/uL (ref 0.0–0.1)
Basophils Relative: 0.3 % (ref 0.0–3.0)
Eosinophils Absolute: 0.1 10*3/uL (ref 0.0–0.7)
Eosinophils Relative: 1.4 % (ref 0.0–5.0)
HCT: 41.6 % (ref 39.0–52.0)
Hemoglobin: 13.6 g/dL (ref 13.0–17.0)
Lymphocytes Relative: 25.2 % (ref 12.0–46.0)
Lymphs Abs: 2.2 10*3/uL (ref 0.7–4.0)
MCHC: 32.7 g/dL (ref 30.0–36.0)
MCV: 92 fl (ref 78.0–100.0)
Monocytes Absolute: 0.5 10*3/uL (ref 0.1–1.0)
Monocytes Relative: 5.8 % (ref 3.0–12.0)
Neutro Abs: 6 10*3/uL (ref 1.4–7.7)
Neutrophils Relative %: 67.3 % (ref 43.0–77.0)
Platelets: 209 10*3/uL (ref 150.0–400.0)
RBC: 4.52 Mil/uL (ref 4.22–5.81)
RDW: 13.5 % (ref 11.5–15.5)
WBC: 8.9 10*3/uL (ref 4.0–10.5)

## 2019-07-09 LAB — BASIC METABOLIC PANEL
BUN: 11 mg/dL (ref 6–23)
CO2: 28 mEq/L (ref 19–32)
Calcium: 9.4 mg/dL (ref 8.4–10.5)
Chloride: 100 mEq/L (ref 96–112)
Creatinine, Ser: 0.9 mg/dL (ref 0.40–1.50)
GFR: 108.05 mL/min (ref >60.00)
Glucose, Bld: 115 mg/dL — ABNORMAL HIGH (ref 70–99)
Potassium: 3.9 mEq/L (ref 3.5–5.1)
Sodium: 137 mEq/L (ref 135–145)

## 2019-07-09 LAB — TESTOSTERONE: Testosterone: 248.3 ng/dL — ABNORMAL LOW (ref 300.00–890.00)

## 2019-07-09 MED ORDER — FREESTYLE LIBRE SENSOR SYSTEM MISC: 3 refills | Status: DC

## 2019-07-09 MED ORDER — FAMOTIDINE 40 MG PO TABS: 40.0000 mg | ORAL_TABLET | Freq: Every day | ORAL | 3 refills | Status: DC

## 2019-07-09 MED ORDER — LOSARTAN POTASSIUM 25 MG PO TABS: 25.0000 mg | ORAL_TABLET | Freq: Every day | ORAL | 3 refills | Status: DC

## 2019-07-09 MED ORDER — FREESTYLE LIBRE READER DEVI: 0 refills | Status: DC

## 2019-07-09 MED ORDER — ROSUVASTATIN CALCIUM 10 MG PO TABS: 10.0000 mg | ORAL_TABLET | Freq: Every day | ORAL | 3 refills | Status: DC

## 2019-07-09 MED ORDER — PANTOPRAZOLE SODIUM 40 MG PO TBEC: 40.0000 mg | DELAYED_RELEASE_TABLET | Freq: Every day | ORAL | 3 refills | Status: DC

## 2019-07-09 MED ORDER — SOLIFENACIN SUCCINATE 5 MG PO TABS: 5.0000 mg | ORAL_TABLET | Freq: Every day | ORAL | 3 refills | Status: DC

## 2019-07-09 MED ORDER — METFORMIN HCL ER 500 MG PO TB24: ORAL_TABLET | ORAL | 3 refills | Status: DC

## 2019-07-09 NOTE — Patient Instructions (Addendum)
Your Libre meter and sensors were sent to the pharmacy  Please continue all other medications as before, and refills have been done if requested.  Please have the pharmacy call with any other refills you may need.  Please continue your efforts at being more active, low cholesterol diet, and weight control.  Please keep your appointments with your specialists as you may have planned  Please go to the LAB at the blood drawing area for the tests to be done  You will be contacted by phone if any changes need to be made immediately.  Otherwise, you will receive a letter about your results with an explanation, but please check with MyChart first.  Please remember to sign up for MyChart if you have not done so, as this will be important to you in the future with finding out test results, communicating by private email, and scheduling acute appointments online when needed.  Please make an Appointment to return in 3 months, or sooner if needed

## 2019-07-09 NOTE — Progress Notes (Signed)
Subjective:    Patient ID: Gregory Owens, male    DOB: Jan 04, 1971, 49 y.o.   MRN: [id number]  HPI  Here to f/u; overall doing ok,  Pt denies chest pain, increasing sob or doe, wheezing, orthopnea, PND, increased LE swelling, palpitations, dizziness or syncope.  Pt denies new neurological symptoms such as new headache, or facial or extremity weakness or numbness.  Pt denies polydipsia, polyuria, s/p ccx 5/8 - 13 hospn.  Denies worsening reflux, abd pain, dysphagia, n/v, bowel change but has had intermittent hematochezia post op, saw [facility] already with tx with proctofoam for hemorrhoid, but had further bleeding, saw dr [facility] with anoscopy, for increased fiber and follow, has f/u 7/9 colonosccopy per dr schooler.  Asks for testosterone check and libre glucometer. Past Medical History:  Diagnosis Date   Abnormal EKG    NS ST-T EKG changes with negative Stress cardiolite 2003   Anxiety    Dehydration 2005   [place] , [place]   Diabetes mellitus without complication ([place])    Hyperglycemia    Hyperlipidemia    IBS (irritable bowel syndrome)    OSA (obstructive sleep apnea)    resolved post surgery   Past Surgical History:  Procedure Laterality Date   CHOLECYSTECTOMY N/A 06/07/2019   Procedure: LAPAROSCOPIC CHOLECYSTECTOMY;  Surgeon: Ileana Roup, MD;  Location: [facility];  Service: General;  Laterality: N/A;   TONSILLECTOMY AND ADENOIDECTOMY      with above   UVULOPALATOPHARYNGOPLASTY  2004   Post op bleeding complication;  Dr Clotilde Dieter TOOTH EXTRACTION      reports that he has never smoked. He has never used smokeless tobacco. He reports that he does not drink alcohol and does not use drugs. family history includes Cirrhosis in his brother; Colon cancer in his paternal grandfather; Diabetes in his maternal grandmother and paternal grandmother; Heart attack (age of onset: 15) in his maternal grandmother and paternal grandmother; Hyperlipidemia in his  father and mother; Hypertension in his father and mother; Other in his mother. No Known Allergies Current Outpatient Medications on File Prior to Visit  Medication Sig Dispense Refill   aspirin 81 MG EC tablet TAKE 1 TABLET (81 MG TOTAL) BY MOUTH DAILY. 90 tablet 3   Blood Glucose Monitoring Suppl (BAYER CONTOUR NEXT MONITOR) w/Device KIT Use as directed to check blood sugars daily Dx e11.9 1 kit 0   cetirizine (ZYRTEC) 10 MG tablet Take 10 mg by mouth daily.       EFFEXOR XR 150 MG 24 hr capsule TAKE 1 CAPSULE BY MOUTH EVERY DAY (Patient taking differently: Take 150 mg by mouth daily. ) 90 capsule 2   glucose blood test strip Use as instructed twice a day E 11.9 Contour Next Strips 100 each 2   ketoconazole (NIZORAL) 2 % shampoo Apply 1 application topically 2 (two) times a week.   6   Probiotic Product (ALIGN PO) Take 1 capsule by mouth daily.      PROCTOFOAM HC rectal foam APPLY RECTALLY ONE TIME A DAY.     testosterone cypionate (DEPOTESTOSTERONE CYPIONATE) 200 MG/ML injection Inject 1 mL (200 mg total) into the muscle every 14 (fourteen) days. 6 mL 1   Current Facility-Administered Medications on File Prior to Visit  Medication Dose Route Frequency Provider Last Rate Last Admin   testosterone cypionate (DEPOTESTOSTERONE CYPIONATE) injection 200 mg  200 mg Intramuscular Q14 Days Biagio Borg, MD   200 mg at 05/20/19 1600   Review  of Systems All otherwise neg per pt    Objective:   Physical Exam BP 130/80 (BP Location: Left Arm, Patient Position: Sitting, Cuff Size: Large)    Pulse 98    Temp 98.8 F (37.1 C) (Oral)    Ht '5\' 8"'  (1.727 m)    Wt 256 lb (116.1 kg)    SpO2 97%    BMI 38.92 kg/m  VS noted,  Constitutional: Pt appears in NAD HENT: Head: NCAT.  Right Ear: External ear normal.  Left Ear: External ear normal.  Eyes: . Pupils are equal, round, and reactive to light. Conjunctivae and EOM are normal Nose: without d/c or deformity Neck: Neck supple. Gross normal  ROM Cardiovascular: Normal rate and regular rhythm.   Pulmonary/Chest: Effort normal and breath sounds without rales or wheezing.  Abd:  Soft, NT, ND, + BS, no organomegaly Neurological: Pt is alert. At baseline orientation, motor grossly intact Skin: Skin is warm. No rashes, other new lesions, no LE edema Psychiatric: Pt behavior is normal without agitation  All otherwise neg per pt Lab Results  Component Value Date   WBC 8.9 07/09/2019   HGB 13.6 07/09/2019   HCT 41.6 07/09/2019   PLT 209.0 07/09/2019   GLUCOSE 115 (H) 07/09/2019   CHOL 111 11/12/2018   TRIG 126.0 11/12/2018   HDL 35.00 (L) 11/12/2018   LDLDIRECT 53.0 08/29/2017   LDLCALC 51 11/12/2018   ALT 42 06/09/2019   AST 30 06/09/2019   NA 137 07/09/2019   K 3.9 07/09/2019   CL 100 07/09/2019   CREATININE 0.90 07/09/2019   BUN 11 07/09/2019   CO2 28 07/09/2019   TSH 2.22 03/28/2018   PSA 0.63 03/28/2018   HGBA1C 6.9 (H) 06/07/2019   MICROALBUR 0.9 03/28/2018      Assessment & Plan:

## 2019-07-10 ENCOUNTER — Encounter: Payer: Self-pay | Admitting: Internal Medicine

## 2019-07-10 DIAGNOSIS — E559 Vitamin D deficiency, unspecified: Secondary | ICD-10-CM

## 2019-07-10 DIAGNOSIS — E538 Deficiency of other specified B group vitamins: Secondary | ICD-10-CM

## 2019-07-12 ENCOUNTER — Encounter: Payer: Self-pay | Admitting: Internal Medicine

## 2019-07-12 NOTE — Assessment & Plan Note (Addendum)
For f/u cbc, cont same tx  I spent 31 minutes in preparing to see the patient by review of recent labs, imaging and procedures, obtaining and reviewing separately obtained history, communicating with the patient and family or caregiver, ordering medications, tests or procedures, and documenting clinical information in the EHR including the differential Dx, treatment, and any further evaluation and other management of hematochezia, gerd, hld, hypogonadism, dm

## 2019-07-12 NOTE — Assessment & Plan Note (Signed)
stable overall by history and exam, recent data reviewed with pt, and pt to continue medical treatment as before,  to f/u any worsening symptoms or concerns  

## 2019-07-12 NOTE — Assessment & Plan Note (Signed)
For testosterone level,  to f/u any worsening symptoms or concerns 

## 2019-07-13 ENCOUNTER — Telehealth: Payer: Self-pay

## 2019-07-13 NOTE — Telephone Encounter (Signed)
Can pt have labs ordered for vitamin d and b12 please?

## 2019-07-13 NOTE — Telephone Encounter (Signed)
New message    Need prior authorization famotidine (PEPCID) 40 MG tablet .    Ref # [id number]

## 2019-07-14 NOTE — Telephone Encounter (Signed)
F/u   famotidine (PEPCID) 40 MG tablet is not cover under the patient insurance   Alternative medication is an options   Ref # [id number]  Please advise

## 2019-07-15 MED ORDER — FAMOTIDINE 20 MG PO TABS: 20.0000 mg | ORAL_TABLET | Freq: Every day | ORAL | 3 refills | Status: DC

## 2019-07-15 NOTE — Telephone Encounter (Signed)
Ok for change pepcid 20 qhs

## 2019-07-15 NOTE — Addendum Note (Signed)
Addended by: Corwin Levins on: 07/15/2019 07:19 PM   Modules accepted: Orders

## 2019-07-20 ENCOUNTER — Encounter: Payer: Self-pay | Admitting: Internal Medicine

## 2019-07-20 ENCOUNTER — Other Ambulatory Visit (INDEPENDENT_AMBULATORY_CARE_PROVIDER_SITE_OTHER): Payer: 59

## 2019-07-20 ENCOUNTER — Other Ambulatory Visit: Payer: Self-pay | Admitting: Internal Medicine

## 2019-07-20 DIAGNOSIS — E538 Deficiency of other specified B group vitamins: Secondary | ICD-10-CM | POA: Diagnosis not present

## 2019-07-20 DIAGNOSIS — E559 Vitamin D deficiency, unspecified: Secondary | ICD-10-CM

## 2019-07-20 LAB — VITAMIN D 25 HYDROXY (VIT D DEFICIENCY, FRACTURES): VITD: 21.83 ng/mL — ABNORMAL LOW (ref 30.00–100.00)

## 2019-07-20 LAB — VITAMIN B12: Vitamin B-12: 198 pg/mL — ABNORMAL LOW (ref 211–911)

## 2019-07-20 MED ORDER — VITAMIN D (ERGOCALCIFEROL) 1.25 MG (50000 UNIT) PO CAPS: 50000.0000 [IU] | ORAL_CAPSULE | ORAL | 0 refills | Status: DC

## 2019-07-20 MED ORDER — VITAMIN B-12 1000 MCG PO TABS
1000.0000 ug | ORAL_TABLET | Freq: Every day | ORAL | 3 refills | Status: DC
Start: 2019-07-20 — End: 2021-01-05

## 2019-07-23 ENCOUNTER — Other Ambulatory Visit: Payer: Self-pay | Admitting: Internal Medicine

## 2019-12-15 ENCOUNTER — Ambulatory Visit (INDEPENDENT_AMBULATORY_CARE_PROVIDER_SITE_OTHER): Payer: 59 | Admitting: Internal Medicine

## 2019-12-15 ENCOUNTER — Encounter: Payer: Self-pay | Admitting: Internal Medicine

## 2019-12-15 ENCOUNTER — Ambulatory Visit (INDEPENDENT_AMBULATORY_CARE_PROVIDER_SITE_OTHER)
Admission: RE | Admit: 2019-12-15 | Discharge: 2019-12-15 | Disposition: A | Discharge status: Home / Self Care | Payer: 59 | Source: Ambulatory Visit | Attending: Internal Medicine | Admitting: Internal Medicine

## 2019-12-15 ENCOUNTER — Other Ambulatory Visit: Payer: Self-pay

## 2019-12-15 VITALS — BP 130/80 | HR 105 | Temp 98.7°F | Ht 68.0 in | Wt 265.0 lb

## 2019-12-15 DIAGNOSIS — K219 Gastro-esophageal reflux disease without esophagitis: Secondary | ICD-10-CM

## 2019-12-15 DIAGNOSIS — Z0001 Encounter for general adult medical examination with abnormal findings: Secondary | ICD-10-CM | POA: Diagnosis not present

## 2019-12-15 DIAGNOSIS — R1031 Right lower quadrant pain: Secondary | ICD-10-CM

## 2019-12-15 DIAGNOSIS — E1165 Type 2 diabetes mellitus with hyperglycemia: Secondary | ICD-10-CM | POA: Diagnosis not present

## 2019-12-15 DIAGNOSIS — Z0000 Encounter for general adult medical examination without abnormal findings: Secondary | ICD-10-CM

## 2019-12-15 DIAGNOSIS — E7849 Other hyperlipidemia: Secondary | ICD-10-CM

## 2019-12-15 DIAGNOSIS — E559 Vitamin D deficiency, unspecified: Secondary | ICD-10-CM

## 2019-12-15 DIAGNOSIS — E538 Deficiency of other specified B group vitamins: Secondary | ICD-10-CM

## 2019-12-15 DIAGNOSIS — E291 Testicular hypofunction: Secondary | ICD-10-CM | POA: Diagnosis not present

## 2019-12-15 DIAGNOSIS — F411 Generalized anxiety disorder: Secondary | ICD-10-CM

## 2019-12-15 DIAGNOSIS — Z1159 Encounter for screening for other viral diseases: Secondary | ICD-10-CM

## 2019-12-15 LAB — LIPID PANEL
Cholesterol: 112 mg/dL (ref 0–200)
HDL: 36.9 mg/dL — ABNORMAL LOW (ref >39.00)
LDL Cholesterol: 50 mg/dL (ref 0–99)
NonHDL: 75.52
Total CHOL/HDL Ratio: 3
Triglycerides: 130 mg/dL (ref 0.0–149.0)
VLDL: 26 mg/dL (ref 0.0–40.0)

## 2019-12-15 LAB — CBC WITH DIFFERENTIAL/PLATELET
Basophils Absolute: 0 10*3/uL (ref 0.0–0.1)
Basophils Relative: 0.5 % (ref 0.0–3.0)
Eosinophils Absolute: 0.2 10*3/uL (ref 0.0–0.7)
Eosinophils Relative: 1.8 % (ref 0.0–5.0)
HCT: 43.3 % (ref 39.0–52.0)
Hemoglobin: 14.2 g/dL (ref 13.0–17.0)
Lymphocytes Relative: 23.9 % (ref 12.0–46.0)
Lymphs Abs: 2.1 10*3/uL (ref 0.7–4.0)
MCHC: 32.7 g/dL (ref 30.0–36.0)
MCV: 90.4 fl (ref 78.0–100.0)
Monocytes Absolute: 0.5 10*3/uL (ref 0.1–1.0)
Monocytes Relative: 5.3 % (ref 3.0–12.0)
Neutro Abs: 6.1 10*3/uL (ref 1.4–7.7)
Neutrophils Relative %: 68.5 % (ref 43.0–77.0)
Platelets: 288 10*3/uL (ref 150.0–400.0)
RBC: 4.79 Mil/uL (ref 4.22–5.81)
RDW: 13.7 % (ref 11.5–15.5)
WBC: 8.9 10*3/uL (ref 4.0–10.5)

## 2019-12-15 LAB — HEPATIC FUNCTION PANEL
ALT: 15 U/L (ref 0–53)
AST: 15 U/L (ref 0–37)
Albumin: 4.2 g/dL (ref 3.5–5.2)
Alkaline Phosphatase: 62 U/L (ref 39–117)
Bilirubin, Direct: 0.1 mg/dL (ref 0.0–0.3)
Total Bilirubin: 0.4 mg/dL (ref 0.2–1.2)
Total Protein: 7.6 g/dL (ref 6.0–8.3)

## 2019-12-15 LAB — URINALYSIS, ROUTINE W REFLEX MICROSCOPIC
Bilirubin Urine: NEGATIVE
Hgb urine dipstick: NEGATIVE
Ketones, ur: NEGATIVE
Leukocytes,Ua: NEGATIVE
Nitrite: NEGATIVE
RBC / HPF: NONE SEEN (ref 0–?)
Specific Gravity, Urine: >=1.03 — AB (ref 1.000–1.030)
Total Protein, Urine: NEGATIVE
Urine Glucose: NEGATIVE
Urobilinogen, UA: 1 (ref 0.0–1.0)
pH: 6 (ref 5.0–8.0)

## 2019-12-15 LAB — LIPASE: Lipase: 44 U/L (ref 11.0–59.0)

## 2019-12-15 LAB — BASIC METABOLIC PANEL
BUN: 17 mg/dL (ref 6–23)
CO2: 25 mEq/L (ref 19–32)
Calcium: 9.2 mg/dL (ref 8.4–10.5)
Chloride: 102 mEq/L (ref 96–112)
Creatinine, Ser: 0.91 mg/dL (ref 0.40–1.50)
GFR: 98.28 mL/min (ref >60.00)
Glucose, Bld: 115 mg/dL — ABNORMAL HIGH (ref 70–99)
Potassium: 3.7 mEq/L (ref 3.5–5.1)
Sodium: 139 mEq/L (ref 135–145)

## 2019-12-15 LAB — MICROALBUMIN / CREATININE URINE RATIO
Creatinine,U: 267.8 mg/dL
Microalb Creat Ratio: 0.7 mg/g (ref 0.0–30.0)
Microalb, Ur: 2 mg/dL — ABNORMAL HIGH (ref 0.0–1.9)

## 2019-12-15 LAB — TSH: TSH: 1.64 u[IU]/mL (ref 0.35–4.50)

## 2019-12-15 LAB — VITAMIN B12: Vitamin B-12: 235 pg/mL (ref 211–911)

## 2019-12-15 LAB — VITAMIN D 25 HYDROXY (VIT D DEFICIENCY, FRACTURES): VITD: 26.09 ng/mL — ABNORMAL LOW (ref 30.00–100.00)

## 2019-12-15 LAB — TESTOSTERONE: Testosterone: 217.24 ng/dL — ABNORMAL LOW (ref 300.00–890.00)

## 2019-12-15 LAB — PSA: PSA: 0.64 ng/mL (ref 0.10–4.00)

## 2019-12-15 LAB — HEMOGLOBIN A1C: Hgb A1c MFr Bld: 6.7 % — ABNORMAL HIGH (ref 4.6–6.5)

## 2019-12-15 NOTE — Progress Notes (Signed)
Subjective:    Patient ID: Gregory Owens, male    DOB: 20-Dec-1970, 49 y.o.   MRN: [id number]  HPI  Here for wellness and f/u;  Overall doing ok;  Pt denies Chest pain, worsening SOB, DOE, wheezing, orthopnea, PND, worsening LE edema, palpitations, dizziness or syncope.  Pt denies neurological change such as new headache, facial or extremity weakness.  Pt denies polydipsia, polyuria, or low sugar symptoms. Pt states overall good compliance with treatment and medications, good tolerability, and has been trying to follow appropriate diet.  Pt denies worsening depressive symptoms, suicidal ideation or panic. No fever, night sweats, wt loss, loss of appetite, or other constitutional symptoms.  Pt states good ability with ADL's, has low fall risk, home safety reviewed and adequate, no other significant changes in hearing or vision, and only occasionally active with exercise. S/p colonoscopy about 2 mo ago with Dr Schooler/[facility] with 1 adenoma polyp for f/u at 2 yrs.  Denies worsening reflux, abd pain, dysphagia, n/v, bowel change or blood, except for mild 4 days onset right lateral lower abd discomfort, dull without radaition, fever.   Past Medical History:  Diagnosis Date  . Abnormal EKG    NS ST-T EKG changes with negative Stress cardiolite 2003  . Anxiety   . Dehydration 2005   [place] , [place]  . Diabetes mellitus without complication ([place])   . Hyperglycemia   . Hyperlipidemia   . IBS (irritable bowel syndrome)   . OSA (obstructive sleep apnea)    resolved post surgery   Past Surgical History:  Procedure Laterality Date  . CHOLECYSTECTOMY N/A 06/07/2019   Procedure: LAPAROSCOPIC CHOLECYSTECTOMY;  Surgeon: Ileana Roup, MD;  Location: [facility];  Service: General;  Laterality: N/A;  . TONSILLECTOMY AND ADENOIDECTOMY      with above  . UVULOPALATOPHARYNGOPLASTY  2004   Post op bleeding complication;  Dr Janace Hoard  . WISDOM TOOTH EXTRACTION      reports that he has never smoked. He  has never used smokeless tobacco. He reports that he does not drink alcohol and does not use drugs. family history includes Cirrhosis in his brother; Colon cancer in his paternal grandfather; Diabetes in his maternal grandmother and paternal grandmother; Heart attack (age of onset: 47) in his maternal grandmother and paternal grandmother; Hyperlipidemia in his father and mother; Hypertension in his father and mother; Other in his mother. No Known Allergies Current Outpatient Medications on File Prior to Visit  Medication Sig Dispense Refill  . aspirin 81 MG EC tablet TAKE 1 TABLET (81 MG TOTAL) BY MOUTH DAILY. 90 tablet 3  . Blood Glucose Monitoring Suppl (BAYER CONTOUR NEXT MONITOR) w/Device KIT Use as directed to check blood sugars daily Dx e11.9 1 kit 0  . cetirizine (ZYRTEC) 10 MG tablet Take 10 mg by mouth daily.      . Continuous Blood Gluc Receiver (FREESTYLE LIBRE READER) DEVI Use as directed daily continuos E11.9 1 each 0  . Continuous Blood Gluc Sensor (FREESTYLE LIBRE SENSOR SYSTEM) MISC Use as directed continous daily every 14 days E11.9 6 each 3  . EFFEXOR XR 150 MG 24 hr capsule TAKE 1 CAPSULE BY MOUTH EVERY DAY (Patient taking differently: Take 150 mg by mouth daily. ) 90 capsule 2  . famotidine (PEPCID) 20 MG tablet Take 1 tablet (20 mg total) by mouth at bedtime. 90 tablet 3  . glucose blood test strip Use as instructed twice a day E 11.9 Contour Next Strips 100 each 2  .  ketoconazole (NIZORAL) 2 % shampoo Apply 1 application topically 2 (two) times a week.   6  . losartan (COZAAR) 25 MG tablet Take 1 tablet (25 mg total) by mouth daily. 90 tablet 3  . metFORMIN (GLUCOPHAGE-XR) 500 MG 24 hr tablet TAKE 4 TABLETS BY MOUTH ONCE DAILY WITH BREAKFAST 360 tablet 3  . pantoprazole (PROTONIX) 40 MG tablet Take 1 tablet (40 mg total) by mouth daily. 90 tablet 3  . Probiotic Product (ALIGN PO) Take 1 capsule by mouth daily.     [name] PROCTOFOAM HC rectal foam APPLY RECTALLY ONE TIME A DAY.     . rosuvastatin (CRESTOR) 10 MG tablet Take 1 tablet (10 mg total) by mouth daily. 90 tablet 3  . solifenacin (VESICARE) 5 MG tablet Take 1 tablet (5 mg total) by mouth daily. 90 tablet 3  . testosterone cypionate (DEPOTESTOSTERONE CYPIONATE) 200 MG/ML injection INJECT 1 ML (200 MG TOTAL) INTO THE MUSCLE EVERY 14 (FOURTEEN) DAYS. 6 mL 1  . vitamin B-12 (CYANOCOBALAMIN) 1000 MCG tablet Take 1 tablet (1,000 mcg total) by mouth daily. 90 tablet 3   Current Facility-Administered Medications on File Prior to Visit  Medication Dose Route Frequency Provider Last Rate Last Admin  . testosterone cypionate (DEPOTESTOSTERONE CYPIONATE) injection 200 mg  200 mg Intramuscular Q14 Days Biagio Borg, MD   200 mg at 05/20/19 1600   Review of Systems All otherwise neg per pt    Objective:   Physical Exam BP 130/80 (BP Location: Left Arm, Patient Position: Sitting, Cuff Size: Large)   Pulse (!) 105   Temp 98.7 F (37.1 C) (Oral)   Ht '5\' 8"'  (1.727 m)   Wt 265 lb (120.2 kg)   SpO2 98%   BMI 40.29 kg/m  VS noted,  Constitutional: Pt appears in NAD HENT: Head: NCAT.  Right Ear: External ear normal.  Left Ear: External ear normal.  Eyes: . Pupils are equal, round, and reactive to light. Conjunctivae and EOM are normal Nose: without d/c or deformity Neck: Neck supple. Gross normal ROM Cardiovascular: Normal rate and regular rhythm.   Pulmonary/Chest: Effort normal and breath sounds without rales or wheezing.  Abd:  Soft, NT, ND, + BS, no organomegaly Neurological: Pt is alert. At baseline orientation, motor grossly intact Skin: Skin is warm. No rashes, other new lesions, no LE edema Psychiatric: Pt behavior is normal without agitation  All otherwise neg per pt Lab Results  Component Value Date   WBC 8.9 12/15/2019   HGB 14.2 12/15/2019   HCT 43.3 12/15/2019   PLT 288.0 12/15/2019   GLUCOSE 115 (H) 12/15/2019   CHOL 112 12/15/2019   TRIG 130.0 12/15/2019   HDL 36.90 (L) 12/15/2019    LDLDIRECT 53.0 08/29/2017   LDLCALC 50 12/15/2019   ALT 15 12/15/2019   AST 15 12/15/2019   NA 139 12/15/2019   K 3.7 12/15/2019   CL 102 12/15/2019   CREATININE 0.91 12/15/2019   BUN 17 12/15/2019   CO2 25 12/15/2019   TSH 1.64 12/15/2019   PSA 0.64 12/15/2019   HGBA1C 6.7 (H) 12/15/2019   MICROALBUR 2.0 (H) 12/15/2019      Assessment & Plan:

## 2019-12-15 NOTE — Patient Instructions (Signed)
Please continue all other medications as before, and refills have been done if requested.  Please have the pharmacy call with any other refills you may need.  Please continue your efforts at being more active, low cholesterol diet, and weight control.  You are otherwise up to date with prevention measures today.  Please keep your appointments with your specialists as you may have planned  Please go to the XRAY Department in the first floor for the x-ray testing  Please go to the LAB at the blood drawing area for the tests to be done  You will be contacted by phone if any changes need to be made immediately.  Otherwise, you will receive a letter about your results with an explanation, but please check with MyChart first.  Please remember to sign up for MyChart if you have not done so, as this will be important to you in the future with finding out test results, communicating by private email, and scheduling acute appointments online when needed.  Please make an Appointment to return in 6 months, or sooner if needed 

## 2019-12-16 LAB — HEPATITIS C ANTIBODY
Hepatitis C Ab: NONREACTIVE
SIGNAL TO CUT-OFF: 0.01 (ref <1.00)

## 2019-12-20 ENCOUNTER — Encounter: Payer: Self-pay | Admitting: Internal Medicine

## 2019-12-20 DIAGNOSIS — R1031 Right lower quadrant pain: Secondary | ICD-10-CM | POA: Insufficient documentation

## 2019-12-20 NOTE — Assessment & Plan Note (Signed)
stable overall by history and exam, recent data reviewed with pt, and pt to continue medical treatment as before,  to f/u any worsening symptoms or concerns  

## 2019-12-20 NOTE — Assessment & Plan Note (Signed)
For f/u lab, stable overall by history and exam, recent data reviewed with pt, and pt to continue medical treatment as before,  to f/u any worsening symptoms or concerns 

## 2019-12-20 NOTE — Assessment & Plan Note (Signed)

## 2019-12-20 NOTE — Assessment & Plan Note (Addendum)
Exam benign, ? Constipation vs msk, for plain film today and labs as ordered  I spent 31 minutes in addition to time for CPX wellness examination in preparing to see the patient by review of recent labs, imaging and procedures, obtaining and reviewing separately obtained history, communicating with the patient and family or caregiver, ordering medications, tests or procedures, and documenting clinical information in the EHR including the differential Dx, treatment, and any further evaluation and other management of rlq pain, dm, hld, gerd, gad, hypogonaidsm

## 2019-12-28 ENCOUNTER — Ambulatory Visit: Payer: 59 | Admitting: Internal Medicine

## 2020-01-27 ENCOUNTER — Other Ambulatory Visit: Payer: Self-pay | Admitting: Internal Medicine

## 2020-02-01 ENCOUNTER — Telehealth: Payer: Self-pay | Admitting: Internal Medicine

## 2020-02-01 ENCOUNTER — Encounter: Payer: Self-pay | Admitting: Internal Medicine

## 2020-02-01 NOTE — Telephone Encounter (Signed)
Sent to Dr. John. 

## 2020-02-01 NOTE — Telephone Encounter (Signed)
I agree to go to ED, but if refuses should either go to UC, or at the least would need ROV here next available, any provider

## 2020-02-01 NOTE — Telephone Encounter (Signed)
Ok for virtual 

## 2020-02-01 NOTE — Telephone Encounter (Signed)
[facility]   Caller states he has blood in stool with side pain. He just noticed it now. No vomiting, fever, or other symptoms.  [facility] advised: Go to ED Now   Patient understood but refused ED.   Please advise.

## 2020-02-04 ENCOUNTER — Ambulatory Visit: Payer: 59

## 2020-02-07 ENCOUNTER — Encounter (HOSPITAL_COMMUNITY): Payer: Self-pay

## 2020-02-07 ENCOUNTER — Emergency Department (HOSPITAL_COMMUNITY)
Admission: EM | Admit: 2020-02-07 | Discharge: 2020-02-07 | Disposition: A | Discharge status: Home / Self Care | Payer: 59 | Attending: Emergency Medicine | Admitting: Emergency Medicine

## 2020-02-07 ENCOUNTER — Other Ambulatory Visit: Payer: Self-pay

## 2020-02-07 DIAGNOSIS — Z5321 Procedure and treatment not carried out due to patient leaving prior to being seen by health care provider: Secondary | ICD-10-CM | POA: Insufficient documentation

## 2020-02-07 DIAGNOSIS — R109 Unspecified abdominal pain: Secondary | ICD-10-CM | POA: Diagnosis present

## 2020-02-07 NOTE — ED Triage Notes (Signed)
Patient c/o right mid abdominal pain  X 2 weeks. Patient denies any N/v/D.

## 2020-02-07 NOTE — ED Notes (Signed)
Pt called 3x for room placement. Eloped from waiting area.  

## 2020-02-08 ENCOUNTER — Encounter: Payer: Self-pay | Admitting: Internal Medicine

## 2020-02-08 ENCOUNTER — Ambulatory Visit (INDEPENDENT_AMBULATORY_CARE_PROVIDER_SITE_OTHER): Payer: 59 | Admitting: Internal Medicine

## 2020-02-08 VITALS — BP 138/86 | HR 111 | Temp 98.7°F | Resp 16 | Ht 68.0 in | Wt 253.0 lb

## 2020-02-08 DIAGNOSIS — G8929 Other chronic pain: Secondary | ICD-10-CM

## 2020-02-08 DIAGNOSIS — E119 Type 2 diabetes mellitus without complications: Secondary | ICD-10-CM | POA: Diagnosis not present

## 2020-02-08 DIAGNOSIS — R109 Unspecified abdominal pain: Secondary | ICD-10-CM

## 2020-02-08 DIAGNOSIS — R1031 Right lower quadrant pain: Secondary | ICD-10-CM

## 2020-02-08 LAB — URINALYSIS, ROUTINE W REFLEX MICROSCOPIC
Hgb urine dipstick: NEGATIVE
Leukocytes,Ua: NEGATIVE
Nitrite: NEGATIVE
RBC / HPF: NONE SEEN (ref 0–?)
Specific Gravity, Urine: 1.025 (ref 1.000–1.030)
Total Protein, Urine: NEGATIVE
Urine Glucose: 100 — AB
Urobilinogen, UA: 0.2 (ref 0.0–1.0)
WBC, UA: NONE SEEN (ref 0–?)
pH: 6 (ref 5.0–8.0)

## 2020-02-08 LAB — BASIC METABOLIC PANEL
BUN: 14 mg/dL (ref 6–23)
CO2: 32 mEq/L (ref 19–32)
Calcium: 9.5 mg/dL (ref 8.4–10.5)
Chloride: 100 mEq/L (ref 96–112)
Creatinine, Ser: 0.94 mg/dL (ref 0.40–1.50)
GFR: 94.43 mL/min (ref >60.00)
Glucose, Bld: 195 mg/dL — ABNORMAL HIGH (ref 70–99)
Potassium: 3.5 mEq/L (ref 3.5–5.1)
Sodium: 137 mEq/L (ref 135–145)

## 2020-02-08 LAB — HEPATIC FUNCTION PANEL
ALT: 20 U/L (ref 0–53)
AST: 19 U/L (ref 0–37)
Albumin: 4.5 g/dL (ref 3.5–5.2)
Alkaline Phosphatase: 56 U/L (ref 39–117)
Bilirubin, Direct: 0.1 mg/dL (ref 0.0–0.3)
Total Bilirubin: 0.4 mg/dL (ref 0.2–1.2)
Total Protein: 8.1 g/dL (ref 6.0–8.3)

## 2020-02-08 NOTE — Progress Notes (Addendum)
Subjective:  Patient ID: Gregory Owens, male    DOB: 1970-08-11  Age: 50 y.o. MRN: [id number]  CC: Abdominal Pain  This visit occurred during the SARS-CoV-2 public health emergency.  Safety protocols were in place, including screening questions prior to the visit, additional usage of staff PPE, and extensive cleaning of exam room while observing appropriate contact time as indicated for disinfecting solutions.   NEW TO ME  HPI Gregory Owens presents for the complaint of right flank pain.  He has had this for several months.  He describes this as a dull ache.  He saw his PCP a few months ago and blood work, urinalysis, and plain films were all unremarkable.  He underwent cholecystectomy about 9 months ago.  He is concerned that he either has a retained gallstone or some form of cancer.  He also complains of chronic, nonradiating low back pain.  Outpatient Medications Prior to Visit  Medication Sig Dispense Refill  . aspirin 81 MG EC tablet TAKE 1 TABLET (81 MG TOTAL) BY MOUTH DAILY. 90 tablet 3  . Blood Glucose Monitoring Suppl (BAYER CONTOUR NEXT MONITOR) w/Device KIT Use as directed to check blood sugars daily Dx e11.9 1 kit 0  . cetirizine (ZYRTEC) 10 MG tablet Take 10 mg by mouth daily.    . Continuous Blood Gluc Receiver (FREESTYLE LIBRE READER) DEVI Use as directed daily continuos E11.9 1 each 0  . Continuous Blood Gluc Sensor (FREESTYLE LIBRE SENSOR SYSTEM) MISC Use as directed continous daily every 14 days E11.9 6 each 3  . EFFEXOR XR 150 MG 24 hr capsule TAKE 1 CAPSULE BY MOUTH EVERY DAY 90 capsule 0  . famotidine (PEPCID) 20 MG tablet Take 1 tablet (20 mg total) by mouth at bedtime. 90 tablet 3  . glucose blood test strip Use as instructed twice a day E 11.9 Contour Next Strips 100 each 2  . ketoconazole (NIZORAL) 2 % shampoo Apply 1 application topically 2 (two) times a week.   6  . losartan (COZAAR) 25 MG tablet Take 1 tablet (25 mg total) by mouth daily. 90  tablet 3  . metFORMIN (GLUCOPHAGE-XR) 500 MG 24 hr tablet TAKE 4 TABLETS BY MOUTH ONCE DAILY WITH BREAKFAST 360 tablet 3  . pantoprazole (PROTONIX) 40 MG tablet Take 1 tablet (40 mg total) by mouth daily. 90 tablet 3  . Probiotic Product (ALIGN PO) Take 1 capsule by mouth daily.     . rosuvastatin (CRESTOR) 10 MG tablet Take 1 tablet (10 mg total) by mouth daily. 90 tablet 3  . solifenacin (VESICARE) 5 MG tablet Take 1 tablet (5 mg total) by mouth daily. 90 tablet 3  . testosterone cypionate (DEPOTESTOSTERONE CYPIONATE) 200 MG/ML injection INJECT 1 ML (200 MG TOTAL) INTO THE MUSCLE EVERY 14 (FOURTEEN) DAYS. 6 mL 1  . vitamin B-12 (CYANOCOBALAMIN) 1000 MCG tablet Take 1 tablet (1,000 mcg total) by mouth daily. 90 tablet 3  . PROCTOFOAM HC rectal foam APPLY RECTALLY ONE TIME A DAY.     Facility-Administered Medications Prior to Visit  Medication Dose Route Frequency Provider Last Rate Last Admin  . testosterone cypionate (DEPOTESTOSTERONE CYPIONATE) injection 200 mg  200 mg Intramuscular Q14 Days Biagio Borg, MD   200 mg at 05/20/19 1600    ROS Review of Systems  Constitutional: Negative for appetite change, chills, diaphoresis, fatigue and fever.  HENT: Negative.   Eyes: Negative.   Respiratory: Negative for cough, chest tightness, shortness of breath and wheezing.  Cardiovascular: Negative for chest pain, palpitations and leg swelling.  Gastrointestinal: Positive for abdominal pain. Negative for blood in stool, constipation, diarrhea, nausea and vomiting.  Endocrine: Negative.   Genitourinary: Positive for flank pain. Negative for difficulty urinating, dysuria, hematuria, penile swelling, scrotal swelling and urgency.  Musculoskeletal: Negative for arthralgias, back pain, myalgias and neck pain.  Skin: Negative.  Negative for color change, pallor and rash.  Neurological: Negative.  Negative for dizziness and weakness.  Hematological: Negative for adenopathy. Does not bruise/bleed  easily.  Psychiatric/Behavioral: Negative for dysphoric mood and hallucinations. The patient is nervous/anxious.     Objective:  BP 138/86   Pulse (!) 111   Temp 98.7 F (37.1 C) (Oral)   Resp 16   Ht 5' 8" (1.727 m)   Wt 253 lb (114.8 kg)   SpO2 97%   BMI 38.47 kg/m   BP Readings from Last 3 Encounters:  02/08/20 138/86  02/07/20 (!) 140/101  12/15/19 130/80    Wt Readings from Last 3 Encounters:  02/08/20 253 lb (114.8 kg)  02/07/20 260 lb (117.9 kg)  12/15/19 265 lb (120.2 kg)    Physical Exam Vitals reviewed.  Constitutional:      General: He is not in acute distress.    Appearance: He is well-developed. He is obese. He is not ill-appearing, toxic-appearing or diaphoretic.  HENT:     Nose: Nose normal.     Mouth/Throat:     Mouth: Mucous membranes are moist.  Eyes:     General: No scleral icterus.    Conjunctiva/sclera: Conjunctivae normal.  Cardiovascular:     Rate and Rhythm: Regular rhythm. Tachycardia present.     Heart sounds: No murmur heard.   Pulmonary:     Effort: Pulmonary effort is normal.     Breath sounds: No stridor. No wheezing, rhonchi or rales.  Abdominal:     General: Abdomen is protuberant. Bowel sounds are normal. There is distension.     Palpations: Abdomen is soft. There is no hepatomegaly, splenomegaly or mass.     Tenderness: There is abdominal tenderness in the right lower quadrant. There is no right CVA tenderness, left CVA tenderness or guarding. Negative signs include Murphy's sign.     Hernia: No hernia is present.    Musculoskeletal:     Cervical back: Neck supple.  Lymphadenopathy:     Cervical: No cervical adenopathy.  Neurological:     Mental Status: He is alert.     Lab Results  Component Value Date   WBC 8.9 12/15/2019   HGB 14.2 12/15/2019   HCT 43.3 12/15/2019   PLT 288.0 12/15/2019   GLUCOSE 115 (H) 12/15/2019   CHOL 112 12/15/2019   TRIG 130.0 12/15/2019   HDL 36.90 (L) 12/15/2019   LDLDIRECT 53.0  08/29/2017   LDLCALC 50 12/15/2019   ALT 15 12/15/2019   AST 15 12/15/2019   NA 139 12/15/2019   K 3.7 12/15/2019   CL 102 12/15/2019   CREATININE 0.91 12/15/2019   BUN 17 12/15/2019   CO2 25 12/15/2019   TSH 1.64 12/15/2019   PSA 0.64 12/15/2019   HGBA1C 6.7 (H) 12/15/2019   MICROALBUR 2.0 (H) 12/15/2019    No results found.  Assessment & Plan:   Kohner was seen today for abdominal pain.  Diagnoses and all orders for this visit:  Right flank pain, chronic- I will again monitor his liver enzymes, renal function, and urine for blood.  I recommended that he undergo an MRI with  and without contrast to see if there is a retained stone and, hernia, malignancy, or an accumulation of scar tissue. -     MR Abdomen W Wo Contrast; Future -     Basic metabolic panel; Future -     Hepatic function panel; Future -     Urinalysis, Routine w reflex microscopic; Future -     Urinalysis, Routine w reflex microscopic -     Hepatic function panel -     Basic metabolic panel  Right lower quadrant abdominal pain- See above -     MR Abdomen W Wo Contrast; Future -     Basic metabolic panel; Future -     Hepatic function panel; Future -     Hepatic function panel -     Basic metabolic panel  Type 2 diabetes mellitus without complication, without long-term current use of insulin (HCC) -     Basic metabolic panel; Future -     Hepatic function panel; Future -     Hepatic function panel -     Basic metabolic panel   I have discontinued Yehoshua K. Vrooman "Keith"'s Proctofoam HC. I am also having him maintain his cetirizine, Bayer Contour Next Monitor, aspirin, ketoconazole, Probiotic Product (ALIGN PO), glucose blood, FreeStyle Libre Reader, [employer], losartan, metFORMIN, pantoprazole, solifenacin, rosuvastatin, famotidine, vitamin B-12, testosterone cypionate, and Effexor XR. We will continue to administer testosterone cypionate.  No orders of the defined types were  placed in this encounter.    Follow-up: Return in about 3 weeks (around 02/29/2020).  Scarlette Calico, MD

## 2020-02-08 NOTE — Patient Instructions (Signed)
Flank Pain, Adult Flank pain is pain that is located on the side of the body between the upper abdomen and the back. This area is called the flank. The pain may occur over a short period of time (acute), or it may be long-term or recurring (chronic). It may be mild or severe. Flank pain can be caused by many things, including:  Muscle soreness or injury.  Kidney stones or kidney disease.  Stress.  A disease of the spine (vertebral disk disease).  A lung infection (pneumonia).  Fluid around the lungs (pulmonary edema).  A skin rash caused by the chickenpox virus (shingles).  Tumors that affect the back of the abdomen.  Gallbladder disease. Follow these instructions at home:  Drink enough fluid to keep your urine clear or pale yellow.  Rest as told by your health care provider.  Take over-the-counter and prescription medicines only as told by your health care provider.  Keep a journal to track what has caused your flank pain and what has made it feel better.  Keep all follow-up visits as told by your health care provider. This is important.   Contact a health care provider if:  Your pain is not controlled with medicine.  You have new symptoms.  Your pain gets worse.  You have a fever.  Your symptoms last longer than 2-3 days.  You have trouble urinating or you are urinating very frequently. Get help right away if:  You have trouble breathing or you are short of breath.  Your abdomen hurts or it is swollen or red.  You have nausea or vomiting.  You feel faint or you pass out.  You have blood in your urine. Summary  Flank pain is pain that is located on the side of the body between the upper abdomen and the back.  The pain may occur over a short period of time (acute), or it may be long-term or recurring (chronic). It may be mild or severe.  Flank pain can be caused by many things.  Contact your health care provider if your symptoms get worse or they last  longer than 2-3 days. This information is not intended to replace advice given to you by your health care provider. Make sure you discuss any questions you have with your health care provider. Document Revised: 10/09/2019 Document Reviewed: 10/09/2019 Elsevier Patient Education  2021 [employer].  

## 2020-02-09 ENCOUNTER — Encounter: Payer: Self-pay | Admitting: Internal Medicine

## 2020-02-09 ENCOUNTER — Other Ambulatory Visit: Payer: 59

## 2020-02-10 ENCOUNTER — Ambulatory Visit
Admission: RE | Admit: 2020-02-10 | Discharge: 2020-02-10 | Disposition: A | Discharge status: Home / Self Care | Payer: 59 | Source: Ambulatory Visit | Attending: Internal Medicine | Admitting: Internal Medicine

## 2020-02-10 ENCOUNTER — Encounter: Payer: Self-pay | Admitting: Internal Medicine

## 2020-02-10 ENCOUNTER — Other Ambulatory Visit: Payer: Self-pay

## 2020-02-10 DIAGNOSIS — G8929 Other chronic pain: Secondary | ICD-10-CM

## 2020-02-10 DIAGNOSIS — R1031 Right lower quadrant pain: Secondary | ICD-10-CM

## 2020-02-10 MED ORDER — GADOBENATE DIMEGLUMINE 529 MG/ML IV SOLN
20.0000 mL | Freq: Once | INTRAVENOUS | Status: AC | PRN
  Administered 2020-02-10: 20 mL via INTRAVENOUS

## 2020-02-15 ENCOUNTER — Encounter: Payer: Self-pay | Admitting: Internal Medicine

## 2020-02-15 ENCOUNTER — Other Ambulatory Visit: Payer: Self-pay | Admitting: Internal Medicine

## 2020-02-15 MED ORDER — VENLAFAXINE HCL ER 75 MG PO CP24: 225.0000 mg | ORAL_CAPSULE | Freq: Every day | ORAL | 3 refills | Status: DC

## 2020-02-26 NOTE — Telephone Encounter (Signed)
The venlafaxin 75 mg capsule 3 capsules requires a PA due to the number of pills per day.   It is available in 150 mg and 75 mg. Can this be sent in instead?

## 2020-02-26 NOTE — Telephone Encounter (Signed)
We can try, but last wk the pharmacy said the higher strengths were on back order, and the 75 were ordered instead on purpose..........[name]

## 2020-02-29 MED ORDER — VENLAFAXINE HCL ER 150 MG PO CP24: 150.0000 mg | ORAL_CAPSULE | Freq: Every day | ORAL | 3 refills | Status: DC

## 2020-02-29 MED ORDER — VENLAFAXINE HCL ER 75 MG PO CP24: 75.0000 mg | ORAL_CAPSULE | Freq: Every day | ORAL | 3 refills | Status: DC

## 2020-02-29 NOTE — Telephone Encounter (Signed)
PA for venlafaxin postponed.   New rx for 150mg  and 75mg  of the venlafaxin sent instead.

## 2020-03-03 ENCOUNTER — Ambulatory Visit: Payer: 59 | Admitting: Internal Medicine

## 2020-03-03 DIAGNOSIS — Z0289 Encounter for other administrative examinations: Secondary | ICD-10-CM

## 2020-03-20 ENCOUNTER — Encounter: Payer: Self-pay | Admitting: Internal Medicine

## 2020-03-29 ENCOUNTER — Encounter: Payer: Self-pay | Admitting: Internal Medicine

## 2020-03-29 ENCOUNTER — Ambulatory Visit (INDEPENDENT_AMBULATORY_CARE_PROVIDER_SITE_OTHER): Payer: 59

## 2020-03-29 ENCOUNTER — Other Ambulatory Visit: Payer: Self-pay

## 2020-03-29 ENCOUNTER — Ambulatory Visit (INDEPENDENT_AMBULATORY_CARE_PROVIDER_SITE_OTHER): Payer: 59 | Admitting: Internal Medicine

## 2020-03-29 DIAGNOSIS — M545 Low back pain, unspecified: Secondary | ICD-10-CM

## 2020-03-29 DIAGNOSIS — Z6838 Body mass index (BMI) 38.0-38.9, adult: Secondary | ICD-10-CM | POA: Insufficient documentation

## 2020-03-29 DIAGNOSIS — K7581 Nonalcoholic steatohepatitis (NASH): Secondary | ICD-10-CM | POA: Insufficient documentation

## 2020-03-29 DIAGNOSIS — E1165 Type 2 diabetes mellitus with hyperglycemia: Secondary | ICD-10-CM | POA: Diagnosis not present

## 2020-03-29 DIAGNOSIS — F411 Generalized anxiety disorder: Secondary | ICD-10-CM | POA: Diagnosis not present

## 2020-03-29 DIAGNOSIS — E559 Vitamin D deficiency, unspecified: Secondary | ICD-10-CM

## 2020-03-29 DIAGNOSIS — E8881 Metabolic syndrome: Secondary | ICD-10-CM | POA: Insufficient documentation

## 2020-03-29 MED ORDER — CYCLOBENZAPRINE HCL 5 MG PO TABS: 5.0000 mg | ORAL_TABLET | Freq: Three times a day (TID) | ORAL | 1 refills | Status: DC | PRN

## 2020-03-29 NOTE — Progress Notes (Signed)
Patient ID: Gregory Owens, male   DOB: Feb 21, 1970, 50 y.o.   MRN: [id number]        Chief Complaint: low back pain, wt loss, dM       HPI:  Gregory Owens is a 50 y.o. male here with c/o 1 mo onset midline low back pain, now mod to severe about 6/10, intermittent, worse with lifting but walking ok, pt more worried about cancer recently due to mothers illness; but denies bowel or bladder change, fever, wt loss,  worsening LE pain/numbness/weakness, gait change or falls. Peak wt at home has been about 275.      Pt denies polydipsia, polyuria,  Pt states overall good compliance with meds, trying to follow lower cholesterol, diabetic diet, wt overall down several lbs with more attempts at exercise.  Denies worsening focal neuro s/s.  Pt denies chest pain, increased sob or doe, wheezing, orthopnea, PND, increased LE swelling, palpitations, dizziness or syncope.  Pt denies fever, wt loss, night sweats, loss of appetite, or other constitutional symptoms  Not taking vit d.  Denies worsening depressive symptoms, suicidal ideation, or panic  Wt Readings from Last 3 Encounters:  03/29/20 252 lb (114.3 kg)  02/08/20 253 lb (114.8 kg)  02/07/20 260 lb (117.9 kg)   BP Readings from Last 3 Encounters:  03/29/20 128/80  02/08/20 138/86  02/07/20 (!) 140/101         Past Medical History:  Diagnosis Date  . Abnormal EKG    NS ST-T EKG changes with negative Stress cardiolite 2003  . Anxiety   . Dehydration 2005   [place] , [place]  . Diabetes mellitus without complication ([place])   . Hyperglycemia   . Hyperlipidemia   . IBS (irritable bowel syndrome)   . OSA (obstructive sleep apnea)    resolved post surgery   Past Surgical History:  Procedure Laterality Date  . CHOLECYSTECTOMY N/A 06/07/2019   Procedure: LAPAROSCOPIC CHOLECYSTECTOMY;  Surgeon: Ileana Roup, MD;  Location: [facility];  Service: General;  Laterality: N/A;  . TONSILLECTOMY AND ADENOIDECTOMY      with above  .  UVULOPALATOPHARYNGOPLASTY  2004   Post op bleeding complication;  Dr Janace Hoard  . WISDOM TOOTH EXTRACTION      reports that he has never smoked. He has never used smokeless tobacco. He reports that he does not drink alcohol and does not use drugs. family history includes Cirrhosis in his brother; Colon cancer in his paternal grandfather; Diabetes in his maternal grandmother and paternal grandmother; Heart attack (age of onset: 71) in his maternal grandmother and paternal grandmother; Hyperlipidemia in his father and mother; Hypertension in his father and mother; Other in his mother. Allergies  Allergen Reactions  . Sulfa Antibiotics Other (See Comments)   Current Outpatient Medications on File Prior to Visit  Medication Sig Dispense Refill  . aspirin 81 MG EC tablet TAKE 1 TABLET (81 MG TOTAL) BY MOUTH DAILY. 90 tablet 3  . Blood Glucose Monitoring Suppl (BAYER CONTOUR NEXT MONITOR) w/Device KIT Use as directed to check blood sugars daily Dx e11.9 1 kit 0  . cetirizine (ZYRTEC) 10 MG tablet Take 10 mg by mouth daily.    . Continuous Blood Gluc Receiver (FREESTYLE LIBRE READER) DEVI Use as directed daily continuos E11.9 1 each 0  . Continuous Blood Gluc Sensor (FREESTYLE LIBRE SENSOR SYSTEM) MISC Use as directed continous daily every 14 days E11.9 6 each 3  . famotidine (PEPCID) 20 MG tablet Take 1 tablet (20 mg  total) by mouth at bedtime. 90 tablet 3  . glucose blood test strip Use as instructed twice a day E 11.9 Contour Next Strips 100 each 2  . ketoconazole (NIZORAL) 2 % shampoo Apply 1 application topically 2 (two) times a week.   6  . losartan (COZAAR) 25 MG tablet Take 1 tablet (25 mg total) by mouth daily. 90 tablet 3  . metFORMIN (GLUCOPHAGE-XR) 500 MG 24 hr tablet TAKE 4 TABLETS BY MOUTH ONCE DAILY WITH BREAKFAST 360 tablet 3  . pantoprazole (PROTONIX) 40 MG tablet Take 1 tablet (40 mg total) by mouth daily. 90 tablet 3  . Probiotic Product (ALIGN PO) Take 1 capsule by mouth daily.      . rosuvastatin (CRESTOR) 10 MG tablet Take 1 tablet (10 mg total) by mouth daily. 90 tablet 3  . solifenacin (VESICARE) 5 MG tablet Take 1 tablet (5 mg total) by mouth daily. 90 tablet 3  . testosterone cypionate (DEPOTESTOSTERONE CYPIONATE) 200 MG/ML injection INJECT 1 ML (200 MG TOTAL) INTO THE MUSCLE EVERY 14 (FOURTEEN) DAYS. 6 mL 1  . venlafaxine XR (EFFEXOR XR) 75 MG 24 hr capsule Take 1 capsule (75 mg total) by mouth daily with breakfast. 30 capsule 3  . venlafaxine XR (EFFEXOR-XR) 150 MG 24 hr capsule Take 1 capsule (150 mg total) by mouth daily with breakfast. 30 capsule 3  . vitamin B-12 (CYANOCOBALAMIN) 1000 MCG tablet Take 1 tablet (1,000 mcg total) by mouth daily. 90 tablet 3  . JORNAY PM 60 MG CP24 Take 1 capsule by mouth at bedtime.    . pramoxine-hydrocortisone (PROCTOCREAM-HC) 1-1 % rectal cream 1 application     Current Facility-Administered Medications on File Prior to Visit  Medication Dose Route Frequency Provider Last Rate Last Admin  . testosterone cypionate (DEPOTESTOSTERONE CYPIONATE) injection 200 mg  200 mg Intramuscular Q14 Days Biagio Borg, MD   200 mg at 05/20/19 1600        ROS:  All others reviewed and negative.  Objective        PE:  BP 128/80   Pulse (!) 109   Temp 98.2 F (36.8 C) (Oral)   Ht '5\' 8"'  (1.727 m)   Wt 252 lb (114.3 kg)   SpO2 96%   BMI 38.32 kg/m                 Constitutional: Pt appears in NAD               HENT: Head: NCAT.                Right Ear: External ear normal.                 Left Ear: External ear normal.                Eyes: . Pupils are equal, round, and reactive to light. Conjunctivae and EOM are normal               Nose: without d/c or deformity               Neck: Neck supple. Gross normal ROM               Cardiovascular: Normal rate and regular rhythm.                 Pulmonary/Chest: Effort normal and breath sounds without rales or wheezing.  Abd:  Soft, NT, ND, + BS, no organomegaly                Spine nontender in midline, does have bilat lumbar paravertebral tender spasm noted               Neurological: Pt is alert. At baseline orientation, motor grossly intact               Skin: Skin is warm. No rashes, no other new lesions, LE edema -none                Psychiatric: Pt behavior is normal without agitation   Micro: none  Cardiac tracings I have personally interpreted today:  none  Pertinent Radiological findings (summarize): none   Lab Results  Component Value Date   WBC 8.9 12/15/2019   HGB 14.2 12/15/2019   HCT 43.3 12/15/2019   PLT 288.0 12/15/2019   GLUCOSE 195 (H) 02/08/2020   CHOL 112 12/15/2019   TRIG 130.0 12/15/2019   HDL 36.90 (L) 12/15/2019   LDLDIRECT 53.0 08/29/2017   LDLCALC 50 12/15/2019   ALT 20 02/08/2020   AST 19 02/08/2020   NA 137 02/08/2020   K 3.5 02/08/2020   CL 100 02/08/2020   CREATININE 0.94 02/08/2020   BUN 14 02/08/2020   CO2 32 02/08/2020   TSH 1.64 12/15/2019   PSA 0.64 12/15/2019   HGBA1C 6.7 (H) 12/15/2019   MICROALBUR 2.0 (H) 12/15/2019   Assessment/Plan:  Gregory Owens is a 50 y.o. Black or African American [2] male with  has a past medical history of Abnormal EKG, Anxiety, Dehydration (2005), Diabetes mellitus without complication ([place]), Hyperglycemia, Hyperlipidemia, IBS (irritable bowel syndrome), and OSA (obstructive sleep apnea).  Low back pain Exam c/w msk strain of the lower lumbar , for ls spine films and flexeril 5 tid prn,  to f/u any worsening symptoms or concerns  Uncontrolled diabetes mellitus ([place]) Lab Results  Component Value Date   HGBA1C 6.7 (H) 12/15/2019   Stable, pt to continue current medical treatment metformin and wt control efforts   Vitamin D deficiency Last vitamin D Lab Results  Component Value Date   VD25OH 26.09 (L) 12/15/2019   Low, to start vit d 2000 u oral replacement   GAD (generalized anxiety disorder) Overall mild situational increased with pain, pt reassured,  for tx as above  Followup: Return if symptoms worsen or fail to improve.  Cathlean Cower, MD 04/07/2020 8:58 AM [facility]

## 2020-03-29 NOTE — Patient Instructions (Signed)
Please take all new medication as prescribed - the muscle relaxer if needed  Please continue all other medications as before, and refills have been done if requested.  Please have the pharmacy call with any other refills you may need.  Please continue your efforts at being more active, low cholesterol diet, and weight control as you are  Please keep your appointments with your specialists as you may have planned  Please go to the XRAY Department in the first floor for the x-ray testing  You will be contacted by phone if any changes need to be made immediately.  Otherwise, you will receive a letter about your results with an explanation, but please check with MyChart first.  Please remember to sign up for MyChart if you have not done so, as this will be important to you in the future with finding out test results, communicating by private email, and scheduling acute appointments online when needed.

## 2020-03-30 ENCOUNTER — Encounter: Payer: Self-pay | Admitting: Internal Medicine

## 2020-04-07 ENCOUNTER — Encounter: Payer: Self-pay | Admitting: Internal Medicine

## 2020-04-07 NOTE — Assessment & Plan Note (Signed)
Last vitamin D Lab Results  Component Value Date   VD25OH 26.09 (L) 12/15/2019   Low, to start vit d 2000 u oral replacement

## 2020-04-07 NOTE — Assessment & Plan Note (Signed)
Exam c/w msk strain of the lower lumbar , for ls spine films and flexeril 5 tid prn,  to f/u any worsening symptoms or concerns

## 2020-04-07 NOTE — Assessment & Plan Note (Signed)
Overall mild situational increased with pain, pt reassured, for tx as above

## 2020-04-07 NOTE — Assessment & Plan Note (Addendum)
Lab Results  Component Value Date   HGBA1C 6.7 (H) 12/15/2019   Stable, pt to continue current medical treatment metformin and wt control efforts

## 2020-05-07 ENCOUNTER — Other Ambulatory Visit: Payer: Self-pay | Admitting: Internal Medicine

## 2020-05-08 NOTE — Telephone Encounter (Signed)
Please refill as per office routine med refill policy (all routine meds refilled for 3 mo or monthly per pt preference up to one year from last visit, then month to month grace period for 3 mo, then further med refills will have to be denied)  

## 2020-05-26 ENCOUNTER — Other Ambulatory Visit: Payer: Self-pay | Admitting: Internal Medicine

## 2020-05-26 MED ORDER — VENLAFAXINE HCL ER 75 MG PO CP24: 225.0000 mg | ORAL_CAPSULE | Freq: Every day | ORAL | 3 refills | Status: DC

## 2020-06-08 ENCOUNTER — Other Ambulatory Visit: Payer: Self-pay

## 2020-06-20 ENCOUNTER — Other Ambulatory Visit: Payer: Self-pay

## 2020-06-21 ENCOUNTER — Ambulatory Visit: Payer: 59 | Admitting: Internal Medicine

## 2020-06-24 ENCOUNTER — Ambulatory Visit: Payer: 59 | Admitting: Internal Medicine

## 2020-07-31 ENCOUNTER — Other Ambulatory Visit: Payer: Self-pay | Admitting: Internal Medicine

## 2020-08-01 NOTE — Telephone Encounter (Signed)
Please refill as per office routine med refill policy (all routine meds refilled for 3 mo or monthly per pt preference up to one year from last visit, then month to month grace period for 3 mo, then further med refills will have to be denied)  

## 2020-08-03 ENCOUNTER — Other Ambulatory Visit: Payer: Self-pay | Admitting: Internal Medicine

## 2020-08-03 NOTE — Telephone Encounter (Signed)
Please refill as per office routine med refill policy (all routine meds refilled for 3 mo or monthly per pt preference up to one year from last visit, then month to month grace period for 3 mo, then further med refills will have to be denied)  

## 2020-08-04 ENCOUNTER — Ambulatory Visit: Payer: 59 | Admitting: Internal Medicine

## 2020-08-18 ENCOUNTER — Telehealth: Payer: 59 | Admitting: Physician Assistant

## 2020-08-18 ENCOUNTER — Encounter: Payer: Self-pay | Admitting: Physician Assistant

## 2020-08-18 DIAGNOSIS — U071 COVID-19: Secondary | ICD-10-CM | POA: Diagnosis not present

## 2020-08-18 MED ORDER — BENZONATATE 100 MG PO CAPS: 100.0000 mg | ORAL_CAPSULE | Freq: Three times a day (TID) | ORAL | 0 refills | Status: DC | PRN

## 2020-08-18 MED ORDER — MOLNUPIRAVIR EUA 200MG CAPSULE: 4.0000 | ORAL_CAPSULE | Freq: Two times a day (BID) | ORAL | 0 refills | Status: AC

## 2020-08-18 MED ORDER — FLUTICASONE PROPIONATE 50 MCG/ACT NA SUSP: 2.0000 | Freq: Every day | NASAL | 6 refills | Status: DC

## 2020-08-18 NOTE — Progress Notes (Signed)
Virtual Visit Consent   Gregory Owens, you are scheduled for a virtual visit with a [facility] provider today.     Just as with appointments in the office, your consent must be obtained to participate.  Your consent will be active for this visit and any virtual visit you may have with one of our providers in the next 365 days.     If you have a MyChart account, a copy of this consent can be sent to you electronically.  All virtual visits are billed to your insurance company just like a traditional visit in the office.    As this is a virtual visit, video technology does not allow for your provider to perform a traditional examination.  This may limit your provider's ability to fully assess your condition.  If your provider identifies any concerns that need to be evaluated in person or the need to arrange testing (such as labs, EKG, etc.), we will make arrangements to do so.     Although advances in technology are sophisticated, we cannot ensure that it will always work on either your end or our end.  If the connection with a video visit is poor, the visit may have to be switched to a telephone visit.  With either a video or telephone visit, we are not always able to ensure that we have a secure connection.     I need to obtain your verbal consent now.   Are you willing to proceed with your visit today?    Gregory Owens has provided verbal consent on 08/18/2020 for a virtual visit (video or telephone).   Mar Daring, PA-C   Date: 08/18/2020 5:24 PM   Virtual Visit via Video Note   I, Mar Daring, connected with  Gregory Owens  ([id number], 09/08/1970) on 08/18/20 at  5:15 PM EDT by a video-enabled telemedicine application and verified that I am speaking with the correct person using two identifiers.  Location: Patient: Virtual Visit Location Patient: Home Provider: Virtual Visit Location Provider: Home Office   I discussed the limitations of  evaluation and management by telemedicine and the availability of in person appointments. The patient expressed understanding and agreed to proceed.    History of Present Illness: Gregory Owens is a 50 y.o. who identifies as a male who was assigned male at birth, and is being seen today for covid 27.  HPI: URI  This is a new problem. Episode onset: tested positive for covid 19 today, symptoms started on. The maximum temperature recorded prior to his arrival was 100.4 - 100.9 F. Associated symptoms include congestion, coughing, headaches, rhinorrhea, sinus pain and a sore throat. He has tried acetaminophen and increased fluids for the symptoms. The treatment provided no relief.    Wife tested positive for Covid 19 on Tuesday, he tested positive today.  Problems:  Patient Active Problem List   Diagnosis Date Noted   Body mass index (BMI) 38.0-38.9, adult 03/29/2020   Hyperglycemia due to type 2 diabetes mellitus ([place]) 66/06/3014   Metabolic syndrome 02/06/3233   Nonalcoholic steatohepatitis ([facility]) 03/29/2020   Vitamin D deficiency 03/29/2020   Low back pain 03/29/2020   Right flank pain, chronic 02/08/2020   Right lower quadrant abdominal pain 12/20/2019   Hematochezia 07/09/2019   Cholecystitis 06/07/2019   S/P laparoscopic cholecystectomy 06/07/2019   Headache 02/08/2019   Urinary urgency 11/12/2018   Hypogonadism in male 09/04/2017   Bilateral foot pain 03/08/2017   Dysphagia 08/29/2016  ADHD 03/28/2016   Morbid obesity ([place]) 12/06/2015   Achrochordon 10/11/2015   Seborrheic dermatitis 10/11/2015   Encounter for well adult exam with abnormal findings 03/10/2015   Skin lesion 03/10/2015   GERD (gastroesophageal reflux disease) 11/04/2013   Tachycardia 11/04/2013   Nonspecific abnormal electrocardiogram (ECG) (EKG) 11/04/2013   Non-compliant behavior 10/14/2013   Uncontrolled diabetes mellitus ([place]) 08/19/2012   PLMD (periodic limb movement disorder) 05/30/2012    SLEEP DISORDER 12/05/2009   Hyperlipidemia 08/25/2009   GAD (generalized anxiety disorder) 08/25/2009   Obstructive sleep apnea 08/25/2009   RHINITIS 05/24/2009    Allergies:  Allergies  Allergen Reactions   Sulfa Antibiotics Other (See Comments)   Medications:  Current Outpatient Medications:    molnupiravir EUA 200 mg CAPS, Take 4 capsules (800 mg total) by mouth 2 (two) times daily for 5 days., Disp: 40 capsule, Rfl: 0   aspirin 81 MG EC tablet, TAKE 1 TABLET (81 MG TOTAL) BY MOUTH DAILY., Disp: 90 tablet, Rfl: 3   benzonatate (TESSALON) 100 MG capsule, Take 1 capsule (100 mg total) by mouth 3 (three) times daily as needed., Disp: 30 capsule, Rfl: 0   Blood Glucose Monitoring Suppl (BAYER CONTOUR NEXT MONITOR) w/Device KIT, Use as directed to check blood sugars daily Dx e11.9, Disp: 1 kit, Rfl: 0   cetirizine (ZYRTEC) 10 MG tablet, Take 10 mg by mouth daily., Disp: , Rfl:    Continuous Blood Gluc Receiver (FREESTYLE LIBRE READER) DEVI, Use as directed daily continuos E11.9, Disp: 1 each, Rfl: 0   Continuous Blood Gluc Sensor ([place]) MISC, Use as directed continous daily every 14 days E11.9, Disp: 6 each, Rfl: 3   cyclobenzaprine (FLEXERIL) 5 MG tablet, Take 1 tablet (5 mg total) by mouth 3 (three) times daily as needed for muscle spasms., Disp: 30 tablet, Rfl: 1   famotidine (PEPCID) 20 MG tablet, Take 1 tablet (20 mg total) by mouth at bedtime., Disp: 90 tablet, Rfl: 3   fluticasone (FLONASE) 50 MCG/ACT nasal spray, Place 2 sprays into both nostrils daily., Disp: 16 g, Rfl: 6   glucose blood test strip, Use as instructed twice a day E 11.9 Contour Next Strips, Disp: 100 each, Rfl: 2   JORNAY PM 60 MG CP24, Take 1 capsule by mouth at bedtime., Disp: , Rfl:    ketoconazole (NIZORAL) 2 % shampoo, Apply 1 application topically 2 (two) times a week. , Disp: , Rfl: 6   losartan (COZAAR) 25 MG tablet, TAKE 1 TABLET BY MOUTH  DAILY, Disp: 90 tablet, Rfl: 1   metFORMIN  (GLUCOPHAGE-XR) 500 MG 24 hr tablet, TAKE 4 TABLETS BY MOUTH ONCE DAILY WITH BREAKFAST, Disp: 360 tablet, Rfl: 1   pantoprazole (PROTONIX) 40 MG tablet, Take 1 tablet (40 mg total) by mouth daily., Disp: 90 tablet, Rfl: 1   Probiotic Product (ALIGN PO), Take 1 capsule by mouth daily. , Disp: , Rfl:    rosuvastatin (CRESTOR) 10 MG tablet, Take 1 tablet (10 mg total) by mouth daily., Disp: 90 tablet, Rfl: 1   solifenacin (VESICARE) 5 MG tablet, Take 1 tablet (5 mg total) by mouth daily., Disp: 90 tablet, Rfl: 1   venlafaxine XR (EFFEXOR XR) 75 MG 24 hr capsule, Take 3 capsules (225 mg total) by mouth daily with breakfast., Disp: 270 capsule, Rfl: 3   vitamin B-12 (CYANOCOBALAMIN) 1000 MCG tablet, Take 1 tablet (1,000 mcg total) by mouth daily., Disp: 90 tablet, Rfl: 3  Current Facility-Administered Medications:    testosterone cypionate (  DEPOTESTOSTERONE CYPIONATE) injection 200 mg, 200 mg, Intramuscular, Q14 Days, Biagio Borg, MD, 200 mg at 05/20/19 1600  Observations/Objective: Patient is well-developed, well-nourished in no acute distress.  Resting comfortably at home.  Head is normocephalic, atraumatic.  No labored breathing.  Speech is clear and coherent with logical content.  Patient is alert and oriented at baseline.    Assessment and Plan: 1. COVID-19 - molnupiravir EUA 200 mg CAPS; Take 4 capsules (800 mg total) by mouth 2 (two) times daily for 5 days.  Dispense: 40 capsule; Refill: 0 - Continue OTC symptomatic management of choice - Will send OTC vitamins and supplement information through AVS - Molnupiravir prescribed - Patient enrolled in MyChart symptom monitoring - Push fluids - Rest as needed - Discussed return precautions and when to seek in-person evaluation, sent via AVS as well  Follow Up Instructions: I discussed the assessment and treatment plan with the patient. The patient was provided an opportunity to ask questions and all were answered. The patient agreed  with the plan and demonstrated an understanding of the instructions.  A copy of instructions were sent to the patient via MyChart.  The patient was advised to call back or seek an in-person evaluation if the symptoms worsen or if the condition fails to improve as anticipated.  Time:  I spent 20 minutes with the patient via telehealth technology discussing the above problems/concerns.    Mar Daring, PA-C

## 2020-08-18 NOTE — Patient Instructions (Addendum)
[name], thank you for joining Mar Daring, PA-C for today's virtual visit.  While this provider is not your primary care provider (PCP), if your PCP is located in our provider database this encounter information will be shared with them immediately following your visit.  Consent: (Patient) Gregory Owens provided verbal consent for this virtual visit at the beginning of the encounter.  Current Medications:  Current Outpatient Medications:    molnupiravir EUA 200 mg CAPS, Take 4 capsules (800 mg total) by mouth 2 (two) times daily for 5 days., Disp: 40 capsule, Rfl: 0   aspirin 81 MG EC tablet, TAKE 1 TABLET (81 MG TOTAL) BY MOUTH DAILY., Disp: 90 tablet, Rfl: 3   benzonatate (TESSALON) 100 MG capsule, Take 1 capsule (100 mg total) by mouth 3 (three) times daily as needed., Disp: 30 capsule, Rfl: 0   Blood Glucose Monitoring Suppl (BAYER CONTOUR NEXT MONITOR) w/Device KIT, Use as directed to check blood sugars daily Dx e11.9, Disp: 1 kit, Rfl: 0   cetirizine (ZYRTEC) 10 MG tablet, Take 10 mg by mouth daily., Disp: , Rfl:    Continuous Blood Gluc Receiver (FREESTYLE LIBRE READER) DEVI, Use as directed daily continuos E11.9, Disp: 1 each, Rfl: 0   Continuous Blood Gluc Sensor (Pala) MISC, Use as directed continous daily every 14 days E11.9, Disp: 6 each, Rfl: 3   cyclobenzaprine (FLEXERIL) 5 MG tablet, Take 1 tablet (5 mg total) by mouth 3 (three) times daily as needed for muscle spasms., Disp: 30 tablet, Rfl: 1   famotidine (PEPCID) 20 MG tablet, Take 1 tablet (20 mg total) by mouth at bedtime., Disp: 90 tablet, Rfl: 3   fluticasone (FLONASE) 50 MCG/ACT nasal spray, Place 2 sprays into both nostrils daily., Disp: 16 g, Rfl: 6   glucose blood test strip, Use as instructed twice a day E 11.9 Contour Next Strips, Disp: 100 each, Rfl: 2   JORNAY PM 60 MG CP24, Take 1 capsule by mouth at bedtime., Disp: , Rfl:    ketoconazole (NIZORAL) 2 %  shampoo, Apply 1 application topically 2 (two) times a week. , Disp: , Rfl: 6   losartan (COZAAR) 25 MG tablet, TAKE 1 TABLET BY MOUTH  DAILY, Disp: 90 tablet, Rfl: 1   metFORMIN (GLUCOPHAGE-XR) 500 MG 24 hr tablet, TAKE 4 TABLETS BY MOUTH ONCE DAILY WITH BREAKFAST, Disp: 360 tablet, Rfl: 1   pantoprazole (PROTONIX) 40 MG tablet, Take 1 tablet (40 mg total) by mouth daily., Disp: 90 tablet, Rfl: 1   Probiotic Product (ALIGN PO), Take 1 capsule by mouth daily. , Disp: , Rfl:    rosuvastatin (CRESTOR) 10 MG tablet, Take 1 tablet (10 mg total) by mouth daily., Disp: 90 tablet, Rfl: 1   solifenacin (VESICARE) 5 MG tablet, Take 1 tablet (5 mg total) by mouth daily., Disp: 90 tablet, Rfl: 1   venlafaxine XR (EFFEXOR XR) 75 MG 24 hr capsule, Take 3 capsules (225 mg total) by mouth daily with breakfast., Disp: 270 capsule, Rfl: 3   vitamin B-12 (CYANOCOBALAMIN) 1000 MCG tablet, Take 1 tablet (1,000 mcg total) by mouth daily., Disp: 90 tablet, Rfl: 3  Current Facility-Administered Medications:    testosterone cypionate (DEPOTESTOSTERONE CYPIONATE) injection 200 mg, 200 mg, Intramuscular, Q14 Days, Biagio Borg, MD, 200 mg at 05/20/19 1600   Medications ordered in this encounter:  Meds ordered this encounter  Medications   molnupiravir EUA 200 mg CAPS    Sig: Take 4 capsules (800 mg  total) by mouth 2 (two) times daily for 5 days.    Dispense:  40 capsule    Refill:  0    Order Specific Question:   Supervising Provider    Answer:   Sabra Heck, [place]     *If you need refills on other medications prior to your next appointment, please contact your pharmacy*  Follow-Up: Call back or seek an in-person evaluation if the symptoms worsen or if the condition fails to improve as anticipated.  Other Instructions See Below   If you have been instructed to have an in-person evaluation today at a local Urgent Care facility, please use the link below. It will take you to a list of all of our available  [facility], including address, phone number and hours of operation. Please do not delay care.  [facility]  If you or a family member do not have a primary care provider, use the link below to schedule a visit and establish care. When you choose a [facility] primary care physician or advanced practice provider, you gain a long-term partner in health. Find a Primary Care Provider  Learn more about [facility]'s in-office and virtual care options: [place] are being prescribed MOLNUPIRAVIR for COVID-19 infection.   Please call the pharmacy or go through the drive through vs going inside if you are picking up the mediation yourself to prevent further spread. If prescribed to a [facility] affiliated pharmacy, a pharmacist will bring the medication out to your car.   ADMINISTRATION INSTRUCTIONS: Take with or without food. Swallow the tablets whole. Don't chew, crush, or break the medications because it might not work as well  For each dose of the medication, you should be taking FOUR tablets at one time, TWICE a day   Finish your full five-day course of Molnupiravir even if you feel better before you're done. Stopping this medication too early can make it less effective to prevent severe illness related to [place].    Molnupiravir is prescribed for YOU ONLY. Don't share it with others, even if they have similar symptoms as you. This medication might not be right for everyone.   Make sure to take steps to protect yourself and others while you're taking this medication in order to get well soon and to prevent others from getting sick with COVID-19.   **If you are of childbearing potential (any gender) - it is advised to not get pregnant while taking this medication and recommended that condoms are used for male partners the next 3 months after taking the medication out of extreme caution    COMMON SIDE EFFECTS: Diarrhea Nausea   Dizziness    If your COVID-19 symptoms get worse, get medical help right away. Call 911 if you experience symptoms such as worsening cough, trouble breathing, chest pain that doesn't go away, confusion, a hard time staying awake, and pale or blue-colored skin. This medication won't prevent all COVID-19 cases from getting worse.  Can take to lessen severity: Vit C 535m twice daily Quercertin [id number] daily Zinc 75-1071mdaily Melatonin 3-6 mg at bedtime Vit D3 1000-2000 IU daily Aspirin 81 mg daily with food Optional: Famotidine 2014maily Also can add tylenol/ibuprofen as needed for fevers and body aches May add Mucinex or Mucinex DM as needed for cough/congestion  10 Things You Can Do to Manage Your COVID-19 Symptoms at Home If you have possible or confirmed COVID-19 Stay home  except to get medical care. Monitor your symptoms carefully. If your symptoms get worse, call your healthcare provider immediately. Get rest and stay hydrated. If you have a medical appointment, call the healthcare provider ahead of time and tell them that you have or may have COVID-19. For medical emergencies, call 911 and notify the dispatch personnel that you have or may have COVID-19. Cover your cough and sneezes with a tissue or use the inside of your elbow. Wash your hands often with soap and water for at least 20 seconds or clean your hands with an alcohol-based hand sanitizer that contains at least 60% alcohol. As much as possible, stay in a specific room and away from other people in your home. Also, you should use a separate bathroom, if available. If you need to be around other people in or outside of the home, wear a mask. Avoid sharing personal items with other people in your household, like dishes, towels, and bedding. Clean all surfaces that are touched often, like counters, tabletops, and doorknobs. Use household cleaning sprays or wipes according to the label  instructions. [email or web address] 08/14/2019 This information is not intended to replace advice given to you by your health care provider. Make sure you discuss any questions you have with your healthcare provider. Document Revised: 03/04/2020 Document Reviewed: 03/04/2020 Elsevier Patient Education  [place].

## 2020-08-18 NOTE — Progress Notes (Signed)
E-Visit  for Positive Covid Test Result  We are sorry you are not feeling well. We are here to help!  You have tested positive for COVID-19, meaning that you were infected with the novel coronavirus and could give the virus to others.  It is vitally important that you stay home so you do not spread it to others.      Please continue isolation at home, for at least 10 days since the start of your symptoms and until you have had 24 hours with no fever (without taking a fever reducer) and with improving of symptoms.  If you have no symptoms but tested positive (or all symptoms resolve after 5 days and you have no fever) you can leave your house but continue to wear a mask around others for an additional 5 days. If you have a fever,continue to stay home until you have had 24 hours of no fever. Most cases improve 5-10 days from onset but we have seen a small number of patients who have gotten worse after the 10 days.  Please be sure to watch for worsening symptoms and remain taking the proper precautions.   Go to the nearest hospital ED for assessment if fever/cough/breathlessness are severe or illness seems like a threat to life.    The following symptoms may appear 2-14 days after exposure: Fever Cough Shortness of breath or difficulty breathing Chills Repeated shaking with chills Muscle pain Headache Sore throat New loss of taste or smell Fatigue Congestion or runny nose Nausea or vomiting Diarrhea  You have been enrolled in MyChart Home Monitoring for COVID-19. Daily you will receive a questionnaire within the MyChart website. Our COVID-19 response team will be monitoring your responses daily.  You can use medication such as prescription cough medication called Tessalon Perles 100 mg. You may take 1-2 capsules every 8 hours as needed for cough and prescription for Fluticasone nasal spray 2 sprays in each nostril one time per day  You may also take acetaminophen (Tylenol) as needed  for fever.  If you desire the FDA-emergency use antiviral medication options, we do require a video visit for that. To access a video visit please select the "Virtual Urgent Care Visit" option in the [employer]. These do have to be started within 5 days of symptom onset, not test date.  HOME CARE: Only take medications as instructed by your medical team. Drink plenty of fluids and get plenty of rest. A steam or ultrasonic humidifier can help if you have congestion.   GET HELP RIGHT AWAY IF YOU HAVE EMERGENCY WARNING SIGNS.  Call 911 or proceed to your closest emergency facility if: You develop worsening high fever. Trouble breathing Bluish lips or face Persistent pain or pressure in the chest New confusion Inability to wake or stay awake You cough up blood. Your symptoms become more severe Inability to hold down food or fluids  This list is not all possible symptoms. Contact your medical provider for any symptoms that are severe or concerning to you.    Your e-visit answers were reviewed by a board certified advanced clinical practitioner to complete your personal care plan.  Depending on the condition, your plan could have included both over the counter or prescription medications.  If there is a problem please reply once you have received a response from your provider.  Your safety is important to [place].  If you have drug allergies check your prescription carefully.    You can use MyChart to  ask questions about today's visit, request a non-urgent call back, or ask for a work or school excuse for 24 hours related to this e-Visit. If it has been greater than 24 hours you will need to follow up with your provider, or enter a new e-Visit to address those concerns. You will get an e-mail in the next two days asking about your experience.  I hope that your e-visit has been valuable and will speed your recovery. Thank you for using e-visits.  I provided 6 minutes of non face-to-face time  during this encounter for chart review and documentation.

## 2020-09-08 ENCOUNTER — Encounter: Payer: Self-pay | Admitting: Internal Medicine

## 2020-09-12 ENCOUNTER — Other Ambulatory Visit: Payer: Self-pay

## 2020-09-12 MED ORDER — FREESTYLE LIBRE SENSOR SYSTEM MISC: 3 refills | Status: DC

## 2020-09-12 MED ORDER — FREESTYLE LIBRE READER DEVI: 0 refills | Status: DC

## 2020-09-12 NOTE — Telephone Encounter (Signed)
Medication refilled

## 2020-11-16 ENCOUNTER — Encounter (HOSPITAL_BASED_OUTPATIENT_CLINIC_OR_DEPARTMENT_OTHER): Payer: Self-pay | Admitting: Emergency Medicine

## 2020-11-16 ENCOUNTER — Other Ambulatory Visit: Payer: Self-pay

## 2020-11-16 ENCOUNTER — Emergency Department (HOSPITAL_BASED_OUTPATIENT_CLINIC_OR_DEPARTMENT_OTHER): Payer: 59 | Admitting: Radiology

## 2020-11-16 ENCOUNTER — Emergency Department (HOSPITAL_BASED_OUTPATIENT_CLINIC_OR_DEPARTMENT_OTHER)
Admission: EM | Admit: 2020-11-16 | Discharge: 2020-11-16 | Disposition: A | Discharge status: Home / Self Care | Payer: 59 | Attending: Emergency Medicine | Admitting: Emergency Medicine

## 2020-11-16 DIAGNOSIS — K219 Gastro-esophageal reflux disease without esophagitis: Secondary | ICD-10-CM | POA: Diagnosis not present

## 2020-11-16 DIAGNOSIS — I10 Essential (primary) hypertension: Secondary | ICD-10-CM | POA: Diagnosis not present

## 2020-11-16 DIAGNOSIS — Z7984 Long term (current) use of oral hypoglycemic drugs: Secondary | ICD-10-CM | POA: Insufficient documentation

## 2020-11-16 DIAGNOSIS — Z79899 Other long term (current) drug therapy: Secondary | ICD-10-CM | POA: Insufficient documentation

## 2020-11-16 DIAGNOSIS — Z7982 Long term (current) use of aspirin: Secondary | ICD-10-CM | POA: Diagnosis not present

## 2020-11-16 DIAGNOSIS — E119 Type 2 diabetes mellitus without complications: Secondary | ICD-10-CM | POA: Diagnosis not present

## 2020-11-16 DIAGNOSIS — R079 Chest pain, unspecified: Secondary | ICD-10-CM | POA: Diagnosis not present

## 2020-11-16 DIAGNOSIS — R1013 Epigastric pain: Secondary | ICD-10-CM | POA: Diagnosis not present

## 2020-11-16 DIAGNOSIS — R109 Unspecified abdominal pain: Secondary | ICD-10-CM | POA: Diagnosis present

## 2020-11-16 LAB — CBC
HCT: 45.9 % (ref 39.0–52.0)
Hemoglobin: 14.6 g/dL (ref 13.0–17.0)
MCH: 29.8 pg (ref 26.0–34.0)
MCHC: 31.8 g/dL (ref 30.0–36.0)
MCV: 93.7 fL (ref 80.0–100.0)
Platelets: 262 10*3/uL (ref 150–400)
RBC: 4.9 MIL/uL (ref 4.22–5.81)
RDW: 12.6 % (ref 11.5–15.5)
WBC: 11.3 10*3/uL — ABNORMAL HIGH (ref 4.0–10.5)
nRBC: 0 % (ref 0.0–0.2)

## 2020-11-16 LAB — TROPONIN I (HIGH SENSITIVITY)
Troponin I (High Sensitivity): 2 ng/L (ref <18)
Troponin I (High Sensitivity): 2 ng/L (ref <18)

## 2020-11-16 LAB — BASIC METABOLIC PANEL
Anion gap: 11 (ref 5–15)
BUN: 16 mg/dL (ref 6–20)
CO2: 26 mmol/L (ref 22–32)
Calcium: 9.5 mg/dL (ref 8.9–10.3)
Chloride: 102 mmol/L (ref 98–111)
Creatinine, Ser: 0.91 mg/dL (ref 0.61–1.24)
GFR, Estimated: >60 mL/min (ref >60)
Glucose, Bld: 154 mg/dL — ABNORMAL HIGH (ref 70–99)
Potassium: 3.6 mmol/L (ref 3.5–5.1)
Sodium: 139 mmol/L (ref 135–145)

## 2020-11-16 MED ORDER — FAMOTIDINE 20 MG PO TABS
20.0000 mg | ORAL_TABLET | Freq: Once | ORAL | Status: AC
  Administered 2020-11-16: 20 mg via ORAL
  Filled 2020-11-16: qty 1

## 2020-11-16 MED ORDER — LIDOCAINE VISCOUS HCL 2 % MT SOLN
15.0000 mL | Freq: Once | OROMUCOSAL | Status: AC
  Administered 2020-11-16: 15 mL via ORAL
  Filled 2020-11-16: qty 15

## 2020-11-16 MED ORDER — ALUM & MAG HYDROXIDE-SIMETH 200-200-20 MG/5ML PO SUSP
30.0000 mL | Freq: Once | ORAL | Status: AC
  Administered 2020-11-16: 30 mL via ORAL
  Filled 2020-11-16: qty 30

## 2020-11-16 MED ORDER — FAMOTIDINE IN NACL 20-0.9 MG/50ML-% IV SOLN
20.0000 mg | Freq: Once | INTRAVENOUS | Status: DC
  Filled 2020-11-16: qty 50

## 2020-11-16 NOTE — ED Notes (Signed)
RT Note: EKG done/labelled/given to MD, Blood work drawn/labelled/given to lab.

## 2020-11-16 NOTE — ED Triage Notes (Signed)
Centralized chest pain since Friday. States he feels like it is his GERD

## 2020-11-16 NOTE — ED Provider Notes (Signed)
[facility] EMERGENCY DEPT Provider Note   CSN: [id number] Arrival date & time: 11/16/20  5284     History Chief Complaint  Patient presents with   Chest Pain    Gregory Owens is a 50 y.o. male.   Chest Pain Associated symptoms: abdominal pain   Associated symptoms: no back pain, no cough, no dizziness, no fatigue, no fever, no headache, no numbness, no palpitations, no shortness of breath, no vomiting and no weakness   Patient presents for pain in area of his epigastrium and chest.  Onset was 4 days ago.  Severity of pain has been waxing and waning.  He does have a history of GERD.  Typically, he takes daily Protonix and Pepcid.  Last week, he went several days without taking his acid blocking medications.  This is prior to the onset of his pain.  Since the onset of pain, he has been taking his Protonix and Pepcid.  He will also occasionally take Tums.  He does state that the pain is worsened after eating.  He denies any associated shortness of breath, diaphoresis, or dizziness.  He does not have a personal history of ACS.  He is followed by [facility] gastroenterology for his chronic GERD. HPI: A 50 year old patient with a history of treated diabetes, hypertension, hypercholesterolemia and obesity presents for evaluation of chest pain. Initial onset of pain was less than one hour ago. The patient's chest pain is well-localized, is sharp and is not worse with exertion. The patient's chest pain is middle- or left-sided, is not described as heaviness/pressure/tightness and does not radiate to the arms/jaw/neck. The patient does not complain of nausea and denies diaphoresis. The patient has no history of stroke, has no history of peripheral artery disease, has not smoked in the past 90 days and has no relevant family history of coronary artery disease (first degree relative at less than age 25).   Past Medical History:  Diagnosis Date   Abnormal EKG    NS ST-T EKG changes  with negative Stress cardiolite 2003   Anxiety    Dehydration 2005   [place] , [place]   Diabetes mellitus without complication ([place])    Hyperglycemia    Hyperlipidemia    IBS (irritable bowel syndrome)    OSA (obstructive sleep apnea)    resolved post surgery    Patient Active Problem List   Diagnosis Date Noted   Body mass index (BMI) 38.0-38.9, adult 03/29/2020   Hyperglycemia due to type 2 diabetes mellitus ([place]) 13/24/4010   Metabolic syndrome 27/25/3664   Nonalcoholic steatohepatitis ([facility]) 03/29/2020   Vitamin D deficiency 03/29/2020   Low back pain 03/29/2020   Right flank pain, chronic 02/08/2020   Right lower quadrant abdominal pain 12/20/2019   Hematochezia 07/09/2019   Cholecystitis 06/07/2019   S/P laparoscopic cholecystectomy 06/07/2019   Headache 02/08/2019   Urinary urgency 11/12/2018   Hypogonadism in male 09/04/2017   Bilateral foot pain 03/08/2017   Dysphagia 08/29/2016   ADHD 03/28/2016   Morbid obesity ([place]) 12/06/2015   Achrochordon 10/11/2015   Seborrheic dermatitis 10/11/2015   Encounter for well adult exam with abnormal findings 03/10/2015   Skin lesion 03/10/2015   GERD (gastroesophageal reflux disease) 11/04/2013   Tachycardia 11/04/2013   Nonspecific abnormal electrocardiogram (ECG) (EKG) 11/04/2013   Non-compliant behavior 10/14/2013   Uncontrolled diabetes mellitus ([place]) 08/19/2012   PLMD (periodic limb movement disorder) 05/30/2012   SLEEP DISORDER 12/05/2009   Hyperlipidemia 08/25/2009   GAD (generalized anxiety disorder) 08/25/2009  Obstructive sleep apnea 08/25/2009   RHINITIS 05/24/2009    Past Surgical History:  Procedure Laterality Date   CHOLECYSTECTOMY N/A 06/07/2019   Procedure: LAPAROSCOPIC CHOLECYSTECTOMY;  Surgeon: Ileana Roup, MD;  Location: [facility];  Service: General;  Laterality: N/A;   TONSILLECTOMY AND ADENOIDECTOMY      with above   UVULOPALATOPHARYNGOPLASTY  2004   Post op bleeding complication;  Dr Clotilde Dieter TOOTH EXTRACTION         Family History  Problem Relation Age of Onset   Hyperlipidemia Mother    Hypertension Mother    Other Mother        Inflammatory Occipital Lymphadenitis   Hypertension Father    Hyperlipidemia Father    Diabetes Maternal Grandmother    Heart attack Maternal Grandmother 66   Diabetes Paternal Grandmother    Heart attack Paternal Grandmother 47   Cirrhosis Brother        autoimmune hepatitis (twin)   Colon cancer Paternal Grandfather     Social History   Tobacco Use   Smoking status: Never   Smokeless tobacco: Never  Vaping Use   Vaping Use: Never used  Substance Use Topics   Alcohol use: No   Drug use: No    Home Medications Prior to Admission medications   Medication Sig Start Date End Date Taking? Authorizing Provider  aspirin 81 MG EC tablet TAKE 1 TABLET (81 MG TOTAL) BY MOUTH DAILY. 03/25/17  Yes Biagio Borg, MD  cetirizine (ZYRTEC) 10 MG tablet Take 10 mg by mouth daily.   Yes [provider]  famotidine (PEPCID) 20 MG tablet Take 1 tablet (20 mg total) by mouth at bedtime. 07/15/19  Yes Biagio Borg, MD  ketoconazole (NIZORAL) 2 % shampoo Apply 1 application topically 2 (two) times a week.  03/23/17  Yes [provider]  losartan (COZAAR) 25 MG tablet TAKE 1 TABLET BY MOUTH  DAILY 08/04/20  Yes Biagio Borg, MD  metFORMIN (GLUCOPHAGE-XR) 500 MG 24 hr tablet TAKE 4 TABLETS BY MOUTH ONCE DAILY WITH BREAKFAST 08/04/20  Yes Biagio Borg, MD  pantoprazole (PROTONIX) 40 MG tablet Take 1 tablet (40 mg total) by mouth daily. 08/04/20  Yes Biagio Borg, MD  Probiotic Product (ALIGN PO) Take 1 capsule by mouth daily.    Yes [provider]  rosuvastatin (CRESTOR) 10 MG tablet Take 1 tablet (10 mg total) by mouth daily. 08/04/20  Yes Biagio Borg, MD  venlafaxine XR (EFFEXOR XR) 75 MG 24 hr capsule Take 3 capsules (225 mg total) by mouth daily with breakfast. 05/26/20  Yes Biagio Borg, MD  vitamin B-12 (CYANOCOBALAMIN)  1000 MCG tablet Take 1 tablet (1,000 mcg total) by mouth daily. 07/20/19  Yes Biagio Borg, MD  benzonatate (TESSALON) 100 MG capsule Take 1 capsule (100 mg total) by mouth 3 (three) times daily as needed. Patient not taking: No sig reported 08/18/20   Mar Daring, PA-C  Blood Glucose Monitoring Suppl (BAYER CONTOUR NEXT MONITOR) w/Device KIT Use as directed to check blood sugars daily Dx e11.9 04/20/15   Biagio Borg, MD  Continuous Blood Gluc Receiver (FREESTYLE LIBRE READER) DEVI Use as directed daily continuos E11.9 09/12/20   Biagio Borg, MD  Continuous Blood Gluc Sensor ([place]) MISC Use as directed continous daily every 14 days E11.9 09/12/20   Biagio Borg, MD  cyclobenzaprine (FLEXERIL) 5 MG tablet Take 1 tablet (5 mg total) by  mouth 3 (three) times daily as needed for muscle spasms. 03/29/20   Biagio Borg, MD  fluticasone (FLONASE) 50 MCG/ACT nasal spray Place 2 sprays into both nostrils daily. 08/18/20   Mar Daring, PA-C  glucose blood test strip Use as instructed twice a day E 11.9 Contour Next Strips 05/25/19   Biagio Borg, MD  solifenacin (VESICARE) 5 MG tablet Take 1 tablet (5 mg total) by mouth daily. Patient not taking: Reported on 11/16/2020 08/04/20   Biagio Borg, MD    Allergies    Sulfa antibiotics  Review of Systems   Review of Systems  Constitutional:  Negative for activity change, chills, fatigue and fever.  HENT:  Negative for congestion, ear pain and sore throat.   Eyes:  Negative for pain and visual disturbance.  Respiratory:  Negative for cough, chest tightness and shortness of breath.   Cardiovascular:  Positive for chest pain. Negative for palpitations.  Gastrointestinal:  Positive for abdominal pain. Negative for diarrhea and vomiting.  Genitourinary:  Negative for dysuria, flank pain and hematuria.  Musculoskeletal:  Negative for arthralgias, back pain, myalgias and neck pain.  Skin:  Negative for color change and  rash.  Neurological:  Negative for dizziness, seizures, syncope, weakness, light-headedness, numbness and headaches.  All other systems reviewed and are negative.  Physical Exam Updated Vital Signs BP 109/73 (BP Location: Right Arm)   Pulse 94   Temp 98.2 F (36.8 C) (Oral)   Resp 17   Ht _0  (1.702 m)   Wt 120.2 kg   SpO2 98%   BMI 41.50 kg/m   Physical Exam Vitals and nursing note reviewed.  Constitutional:      General: He is not in acute distress.    Appearance: He is well-developed. He is not ill-appearing, toxic-appearing or diaphoretic.  HENT:     Head: Normocephalic and atraumatic.  Eyes:     Conjunctiva/sclera: Conjunctivae normal.  Neck:     Vascular: No JVD.  Cardiovascular:     Rate and Rhythm: Normal rate and regular rhythm.     Heart sounds: No murmur heard. Pulmonary:     Effort: Pulmonary effort is normal. No tachypnea or respiratory distress.     Breath sounds: Normal breath sounds. No decreased breath sounds, wheezing, rhonchi or rales.  Chest:     Chest wall: No tenderness.  Abdominal:     Palpations: Abdomen is soft.     Tenderness: There is abdominal tenderness (epigastrium, mild, to deep palpation only).  Musculoskeletal:     Cervical back: Neck supple.     Right lower leg: No edema.     Left lower leg: No edema.  Skin:    General: Skin is warm and dry.     Coloration: Skin is not cyanotic or pale.  Neurological:     General: No focal deficit present.     Mental Status: He is alert and oriented to person, place, and time.     Cranial Nerves: No cranial nerve deficit.     Motor: No weakness.  Psychiatric:        Mood and Affect: Mood normal.        Behavior: Behavior normal.    ED Results / Procedures / Treatments   Labs (all labs ordered are listed, but only abnormal results are displayed) Labs Reviewed  BASIC METABOLIC PANEL - Abnormal; Notable for the following components:      Result Value   Glucose, Bld 154 (*)  All other  components within normal limits  CBC - Abnormal; Notable for the following components:   WBC 11.3 (*)    All other components within normal limits  TROPONIN I (HIGH SENSITIVITY)  TROPONIN I (HIGH SENSITIVITY)    EKG EKG Interpretation  Date/Time:  Wednesday November 16 2020 10:09:17 EDT Ventricular Rate:  104 PR Interval:  120 QRS Duration: 70 QT Interval:  338 QTC Calculation: 444 R Axis:   51 Text Interpretation: Sinus tachycardia Otherwise normal ECG Confirmed by Godfrey Pick [phone]) on 11/16/2020 11:25:46 AM  Radiology DG Chest 2 View  Result Date: 11/16/2020 CLINICAL DATA:  Centralized chest pain since Friday. States he feels like it is his GERD EXAM: CHEST - 2 VIEW COMPARISON:  06/06/2019 FINDINGS: Lungs are clear. Heart size and mediastinal contours are within normal limits. No effusion.  No pneumothorax. Visualized bones unremarkable. IMPRESSION: No acute cardiopulmonary disease. Electronically Signed   By: Lucrezia Europe M.D.   On: 11/16/2020 10:49    Procedures Procedures   Medications Ordered in ED Medications  alum & mag hydroxide-simeth (MAALOX/MYLANTA) 200-200-20 MG/5ML suspension 30 mL (30 mLs Oral Given 11/16/20 1238)    And  lidocaine (XYLOCAINE) 2 % viscous mouth solution 15 mL (15 mLs Oral Given 11/16/20 1238)  famotidine (PEPCID) tablet 20 mg (20 mg Oral Given 11/16/20 1237)    ED Course  I have reviewed the triage vital signs and the nursing notes.  Pertinent labs & imaging results that were available during my care of the patient were reviewed by me and considered in my medical decision making (see chart for details).    MDM Rules/Calculators/A&P HEAR Score: 3                        Patient presents for 4 days of intermittent pain in area of his epigastrium and lower chest.  He states that his symptoms are consistent with previous episodes of GERD.  Typically, he takes Pepcid and Protonix.  He did miss several days of this last week, prior to symptom onset.   On arrival in the ED, vital signs are normal.  EKG without any evidence of ischemia.  He is well-appearing on exam.  He has some mild tenderness to deep palpation in the area of his epigastrium.  Lab work, including troponins was ordered.  Following normal troponin, patient was given GI cocktail.  He did report improved symptoms following GI cocktail.  Delta troponin was normal.  Patient was advised to follow-up with his gastroenterologist for ongoing management of his GERD, and to return to the ED for any worsening symptoms.  He was discharged in good condition.  Final Clinical Impression(s) / ED Diagnoses Final diagnoses:  Epigastric discomfort    Rx / DC Orders ED Discharge Orders     None        Godfrey Pick, MD 11/17/20 0830

## 2020-11-19 ENCOUNTER — Encounter (HOSPITAL_BASED_OUTPATIENT_CLINIC_OR_DEPARTMENT_OTHER): Payer: Self-pay | Admitting: Emergency Medicine

## 2020-11-19 ENCOUNTER — Emergency Department (HOSPITAL_BASED_OUTPATIENT_CLINIC_OR_DEPARTMENT_OTHER)
Admission: EM | Admit: 2020-11-19 | Discharge: 2020-11-19 | Disposition: A | Discharge status: Home / Self Care | Payer: 59 | Attending: Emergency Medicine | Admitting: Emergency Medicine

## 2020-11-19 ENCOUNTER — Emergency Department (HOSPITAL_BASED_OUTPATIENT_CLINIC_OR_DEPARTMENT_OTHER): Payer: 59

## 2020-11-19 ENCOUNTER — Other Ambulatory Visit: Payer: Self-pay

## 2020-11-19 DIAGNOSIS — R1013 Epigastric pain: Secondary | ICD-10-CM | POA: Insufficient documentation

## 2020-11-19 DIAGNOSIS — Z7982 Long term (current) use of aspirin: Secondary | ICD-10-CM | POA: Diagnosis not present

## 2020-11-19 DIAGNOSIS — K219 Gastro-esophageal reflux disease without esophagitis: Secondary | ICD-10-CM | POA: Diagnosis not present

## 2020-11-19 LAB — CBC
HCT: 45.2 % (ref 39.0–52.0)
Hemoglobin: 14.7 g/dL (ref 13.0–17.0)
MCH: 30.3 pg (ref 26.0–34.0)
MCHC: 32.5 g/dL (ref 30.0–36.0)
MCV: 93.2 fL (ref 80.0–100.0)
Platelets: 252 10*3/uL (ref 150–400)
RBC: 4.85 MIL/uL (ref 4.22–5.81)
RDW: 12.4 % (ref 11.5–15.5)
WBC: 11.7 10*3/uL — ABNORMAL HIGH (ref 4.0–10.5)
nRBC: 0 % (ref 0.0–0.2)

## 2020-11-19 LAB — HEPATIC FUNCTION PANEL
ALT: 12 U/L (ref 0–44)
AST: 19 U/L (ref 15–41)
Albumin: 4.2 g/dL (ref 3.5–5.0)
Alkaline Phosphatase: 45 U/L (ref 38–126)
Bilirubin, Direct: 0.1 mg/dL (ref 0.0–0.2)
Indirect Bilirubin: 0.3 mg/dL (ref 0.3–0.9)
Total Bilirubin: 0.4 mg/dL (ref 0.3–1.2)
Total Protein: 7.8 g/dL (ref 6.5–8.1)

## 2020-11-19 LAB — BASIC METABOLIC PANEL
Anion gap: 10 (ref 5–15)
BUN: 11 mg/dL (ref 6–20)
CO2: 27 mmol/L (ref 22–32)
Calcium: 9.1 mg/dL (ref 8.9–10.3)
Chloride: 102 mmol/L (ref 98–111)
Creatinine, Ser: 1.03 mg/dL (ref 0.61–1.24)
GFR, Estimated: >60 mL/min (ref >60)
Glucose, Bld: 140 mg/dL — ABNORMAL HIGH (ref 70–99)
Potassium: 4.5 mmol/L (ref 3.5–5.1)
Sodium: 139 mmol/L (ref 135–145)

## 2020-11-19 LAB — TROPONIN I (HIGH SENSITIVITY)
Troponin I (High Sensitivity): 3 ng/L (ref <18)
Troponin I (High Sensitivity): 3 ng/L (ref <18)

## 2020-11-19 LAB — LIPASE, BLOOD: Lipase: 27 U/L (ref 11–51)

## 2020-11-19 MED ORDER — ALUM & MAG HYDROXIDE-SIMETH 200-200-20 MG/5ML PO SUSP
30.0000 mL | Freq: Once | ORAL | Status: AC
  Administered 2020-11-19: 30 mL via ORAL
  Filled 2020-11-19: qty 30

## 2020-11-19 MED ORDER — FAMOTIDINE 20 MG PO TABS
20.0000 mg | ORAL_TABLET | Freq: Once | ORAL | Status: AC
  Administered 2020-11-19: 20 mg via ORAL
  Filled 2020-11-19: qty 1

## 2020-11-19 MED ORDER — LIDOCAINE VISCOUS HCL 2 % MT SOLN
15.0000 mL | Freq: Once | OROMUCOSAL | Status: AC
  Administered 2020-11-19: 15 mL via OROMUCOSAL
  Filled 2020-11-19: qty 15

## 2020-11-19 NOTE — ED Notes (Signed)
Pt given crackers per request

## 2020-11-19 NOTE — ED Notes (Signed)
Pt NAD, a/ox4. Pt states he has had 5/10 epigastric discomfort x 2 nights. Pt states he has GERD but this episode is worse. Was here Wednesday for same, given GI cocktail with relief. Pt denies CP, SOB, n/v/d, fever, dizziness. ABD soft non tender.

## 2020-11-19 NOTE — ED Notes (Signed)
Pt NAD, a/ox4. Pt verbalizes understanding of all DC and f/u instructions. All questions answered. Pt walks with steady gait to lobby at DC.  ? ?

## 2020-11-19 NOTE — ED Provider Notes (Signed)
[facility] Provider Note  CSN: [id number] Arrival date & time: 11/19/20 1251    History Chief Complaint  Patient presents with   Chest Pain    Gregory Owens is a 50 y.o. male with history of GERD managed by GI with Protonix and Pepcid has had several days of what he describes as chest pain but he actually points to his epigastrium. He was evaluated for similar 3 days ago, had neg Trop and improved symptoms after GI cocktail. He was discharged with instructions to follow up with his GI doctor, scheduled in 2 weeks. He has continued to have a persistent fullness sensation in his epigastrium that does not radiate. Feels like he needs to belch/vomit to relieve the pressure but he has not had any vomiting or fever. No cough, no trouble breathing. He has been taking his GERD medications as prescribed although he admits he had not been taking them for a week prior to his symptoms starting. He has had a prior 'subtotal' cholecystectomy in May 2022.    Past Medical History:  Diagnosis Date   Abnormal EKG    NS ST-T EKG changes with negative Stress cardiolite 2003   Anxiety    Dehydration 2005   [place] , [place]   Diabetes mellitus without complication ([place])    Hyperglycemia    Hyperlipidemia    IBS (irritable bowel syndrome)    OSA (obstructive sleep apnea)    resolved post surgery    Past Surgical History:  Procedure Laterality Date   CHOLECYSTECTOMY N/A 06/07/2019   Procedure: LAPAROSCOPIC CHOLECYSTECTOMY;  Surgeon: Ileana Roup, MD;  Location: [facility];  Service: General;  Laterality: N/A;   TONSILLECTOMY AND ADENOIDECTOMY      with above   UVULOPALATOPHARYNGOPLASTY  2004   Post op bleeding complication;  Dr Clotilde Dieter TOOTH EXTRACTION      Family History  Problem Relation Age of Onset   Hyperlipidemia Mother    Hypertension Mother    Other Mother        Inflammatory Occipital Lymphadenitis   Hypertension Father     Hyperlipidemia Father    Diabetes Maternal Grandmother    Heart attack Maternal Grandmother 84   Diabetes Paternal Grandmother    Heart attack Paternal Grandmother 37   Cirrhosis Brother        autoimmune hepatitis (twin)   Colon cancer Paternal Grandfather     Social History   Tobacco Use   Smoking status: Never   Smokeless tobacco: Never  Vaping Use   Vaping Use: Never used  Substance Use Topics   Alcohol use: No   Drug use: No     Home Medications Prior to Admission medications   Medication Sig Start Date End Date Taking? Authorizing Provider  aspirin 81 MG EC tablet TAKE 1 TABLET (81 MG TOTAL) BY MOUTH DAILY. 03/25/17   Biagio Borg, MD  benzonatate (TESSALON) 100 MG capsule Take 1 capsule (100 mg total) by mouth 3 (three) times daily as needed. Patient not taking: No sig reported 08/18/20   Mar Daring, PA-C  Blood Glucose Monitoring Suppl (BAYER CONTOUR NEXT MONITOR) w/Device KIT Use as directed to check blood sugars daily Dx e11.9 04/20/15   Biagio Borg, MD  cetirizine (ZYRTEC) 10 MG tablet Take 10 mg by mouth daily.    [provider]  Continuous Blood Gluc Receiver (FREESTYLE LIBRE READER) DEVI Use as directed daily continuos E11.9 09/12/20   Biagio Borg, MD  Continuous Blood Gluc Sensor (FREESTYLE LIBRE SENSOR SYSTEM) MISC Use as directed continous daily every 14 days E11.9 09/12/20   Biagio Borg, MD  cyclobenzaprine (FLEXERIL) 5 MG tablet Take 1 tablet (5 mg total) by mouth 3 (three) times daily as needed for muscle spasms. 03/29/20   Biagio Borg, MD  famotidine (PEPCID) 20 MG tablet Take 1 tablet (20 mg total) by mouth at bedtime. 07/15/19   Biagio Borg, MD  fluticasone (FLONASE) 50 MCG/ACT nasal spray Place 2 sprays into both nostrils daily. 08/18/20   Mar Daring, PA-C  glucose blood test strip Use as instructed twice a day E 11.9 Contour Next Strips 05/25/19   Biagio Borg, MD  ketoconazole (NIZORAL) 2 % shampoo Apply 1 application  topically 2 (two) times a week.  03/23/17   [provider]  losartan (COZAAR) 25 MG tablet TAKE 1 TABLET BY MOUTH  DAILY 08/04/20   Biagio Borg, MD  metFORMIN (GLUCOPHAGE-XR) 500 MG 24 hr tablet TAKE 4 TABLETS BY MOUTH ONCE DAILY WITH BREAKFAST 08/04/20   Biagio Borg, MD  pantoprazole (PROTONIX) 40 MG tablet Take 1 tablet (40 mg total) by mouth daily. 08/04/20   Biagio Borg, MD  Probiotic Product (ALIGN PO) Take 1 capsule by mouth daily.     [provider]  rosuvastatin (CRESTOR) 10 MG tablet Take 1 tablet (10 mg total) by mouth daily. 08/04/20   Biagio Borg, MD  solifenacin (VESICARE) 5 MG tablet Take 1 tablet (5 mg total) by mouth daily. Patient not taking: Reported on 11/16/2020 08/04/20   Biagio Borg, MD  venlafaxine XR (EFFEXOR XR) 75 MG 24 hr capsule Take 3 capsules (225 mg total) by mouth daily with breakfast. 05/26/20   Biagio Borg, MD  vitamin B-12 (CYANOCOBALAMIN) 1000 MCG tablet Take 1 tablet (1,000 mcg total) by mouth daily. 07/20/19   Biagio Borg, MD     Allergies    Sulfa antibiotics   Review of Systems   Review of Systems A comprehensive review of systems was completed and negative except as noted in HPI.    Physical Exam BP 127/77   Pulse 80   Temp 98.6 F (37 C)   Resp 17   SpO2 98%   Physical Exam Vitals and nursing note reviewed.  Constitutional:      Appearance: Normal appearance.  HENT:     Head: Normocephalic and atraumatic.     Nose: Nose normal.     Mouth/Throat:     Mouth: Mucous membranes are moist.  Eyes:     Extraocular Movements: Extraocular movements intact.     Conjunctiva/sclera: Conjunctivae normal.  Cardiovascular:     Rate and Rhythm: Normal rate.  Pulmonary:     Effort: Pulmonary effort is normal.     Breath sounds: Normal breath sounds.  Abdominal:     General: Abdomen is flat.     Palpations: Abdomen is soft. There is no mass.     Tenderness: There is abdominal tenderness (epigastric). There is no guarding.      Hernia: No hernia is present.  Musculoskeletal:        General: No swelling. Normal range of motion.     Cervical back: Neck supple.     Right lower leg: No edema.     Left lower leg: No edema.  Skin:    General: Skin is warm and dry.  Neurological:     General: No focal deficit present.  Mental Status: He is alert.  Psychiatric:        Mood and Affect: Mood normal.     ED Results / Procedures / Treatments   Labs (all labs ordered are listed, but only abnormal results are displayed) Labs Reviewed  BASIC METABOLIC PANEL - Abnormal; Notable for the following components:      Result Value   Glucose, Bld 140 (*)    All other components within normal limits  CBC - Abnormal; Notable for the following components:   WBC 11.7 (*)    All other components within normal limits  LIPASE, BLOOD  HEPATIC FUNCTION PANEL  TROPONIN I (HIGH SENSITIVITY)  TROPONIN I (HIGH SENSITIVITY)    EKG EKG Interpretation  Date/Time:  Saturday November 19 2020 12:58:17 EDT Ventricular Rate:  90 PR Interval:  106 QRS Duration: 70 QT Interval:  352 QTC Calculation: 430 R Axis:   60 Text Interpretation: Sinus rhythm with short PR Cannot rule out Anterior infarct , age undetermined Abnormal ECG Confirmed by Thamas Jaegers ([id number]) on 11/19/2020 2:42:03 PM   Radiology DG Chest Portable 1 View  Result Date: 11/19/2020 CLINICAL DATA:  Epigastric pain. EXAM: PORTABLE CHEST 1 VIEW COMPARISON:  Chest x-ray 11/16/2020, CT cardiac 06/24/2018 FINDINGS: The heart and mediastinal contours are within normal limits. No focal consolidation. No pulmonary edema. No pleural effusion. No pneumothorax. No acute osseous abnormality. IMPRESSION: No active disease. Electronically Signed   By: Iven Finn M.D.   On: 11/19/2020 15:17    Procedures Procedures  Medications Ordered in the ED Medications  alum & mag hydroxide-simeth (MAALOX/MYLANTA) 200-200-20 MG/5ML suspension 30 mL (30 mLs Oral Given 11/19/20 1519)   lidocaine (XYLOCAINE) 2 % viscous mouth solution 15 mL (15 mLs Mouth/Throat Given 11/19/20 1519)  famotidine (PEPCID) tablet 20 mg (20 mg Oral Given 11/19/20 1519)     MDM Rules/Calculators/A&P MDM Patient here for reported chest pain but really is having epigastric pain, persistent for about a week. Was previously improved with GI cocktail. Cardiac workup initiated in triage had LFTs and lipase added as well.   ED Course  I have reviewed the triage vital signs and the nursing notes.  Pertinent labs & imaging results that were available during my care of the patient were reviewed by me and considered in my medical decision making (see chart for details).  Clinical Course as of 11/19/20 1652  Sat Nov 19, 2020  1520 CBC with mild leukocytosis. BMP and first Trop are normal.  [CS]  1520 CXR is clear [CS]  2229 LFTs and Lipase are normal.  [CS]  1649 Repeat Trop is neg. Patient reports symptoms improved with GI cocktail. Advised to get some maalox/mylanta to take at home as needed. Continue with plan for outpatient GI follow up.  [CS]    Clinical Course User Index [CS] Truddie Hidden, MD    Final Clinical Impression(s) / ED Diagnoses Final diagnoses:  Epigastric pain  Gastroesophageal reflux disease, unspecified whether esophagitis present    Rx / DC Orders ED Discharge Orders     None        Truddie Hidden, MD 11/19/20 1652

## 2020-11-19 NOTE — ED Triage Notes (Signed)
Centralized chest pain for 1 week. He was seen here last week and diagnosed with epigastric pain. He states the GI cocktail he received helped but the pain has returned.

## 2020-11-19 NOTE — ED Provider Notes (Signed)
Emergency Medicine Provider Triage Evaluation Note  Gregory Owens , a 50 y.o. male  was evaluated in triage.  Pt complains of epigastric pain that has been ongoing for the last week.  Was seen evaluated in the emergency department a few days ago for similar symptoms.  Cardiac enzymes at that time were negative.  GI cocktail improved symptoms.  Has an appointment with his GI doctor in 2 weeks for further evaluation.  Symptoms are not improving  Review of Systems  Positive:  Negative: See above  Physical Exam  BP (!) 149/90 (BP Location: Right Arm)   Pulse (!) 109   Temp 98.6 F (37 C)   Resp (!) 22   SpO2 98%  Gen:   Awake, no distress   Resp:  Normal effort  MSK:   Moves extremities without difficulty  Other:  Mild epigastric tenderness.  Medical Decision Making  Medically screening exam initiated at 2:31 PM.  Appropriate orders placed.  Breydan Mccauley Diehl was informed that the remainder of the evaluation will be completed by another provider, this initial triage assessment does not replace that evaluation, and the importance of remaining in the ED until their evaluation is complete.     Honor Loh Tulia, PA-C 11/19/20 1432    Cheryll Cockayne, MD 11/20/20 [phone]

## 2020-11-27 ENCOUNTER — Other Ambulatory Visit: Payer: Self-pay | Admitting: Internal Medicine

## 2020-11-28 ENCOUNTER — Encounter: Payer: Self-pay | Admitting: Internal Medicine

## 2020-11-29 MED ORDER — KETOCONAZOLE 2 % EX SHAM: 1.0000 "application " | MEDICATED_SHAMPOO | CUTANEOUS | 6 refills | Status: DC

## 2020-11-29 NOTE — Telephone Encounter (Signed)
Refilled medication

## 2020-11-29 NOTE — Addendum Note (Signed)
Addended by: Nena Polio on: 11/29/2020 11:56 AM   Modules accepted: Orders

## 2020-12-15 ENCOUNTER — Other Ambulatory Visit: Payer: Self-pay

## 2020-12-15 ENCOUNTER — Ambulatory Visit (INDEPENDENT_AMBULATORY_CARE_PROVIDER_SITE_OTHER): Payer: 59 | Admitting: Internal Medicine

## 2020-12-15 ENCOUNTER — Encounter: Payer: Self-pay | Admitting: Internal Medicine

## 2020-12-15 VITALS — BP 118/60 | HR 101 | Temp 98.6°F | Ht 67.0 in | Wt 258.0 lb

## 2020-12-15 DIAGNOSIS — R82998 Other abnormal findings in urine: Secondary | ICD-10-CM | POA: Diagnosis not present

## 2020-12-15 DIAGNOSIS — Z0001 Encounter for general adult medical examination with abnormal findings: Secondary | ICD-10-CM

## 2020-12-15 DIAGNOSIS — E538 Deficiency of other specified B group vitamins: Secondary | ICD-10-CM | POA: Diagnosis not present

## 2020-12-15 DIAGNOSIS — G4733 Obstructive sleep apnea (adult) (pediatric): Secondary | ICD-10-CM | POA: Diagnosis not present

## 2020-12-15 DIAGNOSIS — Z23 Encounter for immunization: Secondary | ICD-10-CM | POA: Diagnosis not present

## 2020-12-15 DIAGNOSIS — E7800 Pure hypercholesterolemia, unspecified: Secondary | ICD-10-CM

## 2020-12-15 DIAGNOSIS — E559 Vitamin D deficiency, unspecified: Secondary | ICD-10-CM

## 2020-12-15 DIAGNOSIS — E1165 Type 2 diabetes mellitus with hyperglycemia: Secondary | ICD-10-CM

## 2020-12-15 DIAGNOSIS — E291 Testicular hypofunction: Secondary | ICD-10-CM

## 2020-12-15 LAB — HM DIABETES EYE EXAM

## 2020-12-15 NOTE — Patient Instructions (Addendum)
We have discussed the Cardiac CT Score test to measure the calcification level (if any) in your heart arteries.  This test has been ordered in our Computer System, so please call [place] [facility] directly, as they prefer this, at [phone] to be scheduled.  You had the Shingrix shingels shot #1 today  Please return in 2 months for a NURSE VISIT for Shingles sho #2  Please take OTC Vitamin D3 at 2000 units per day, indefinitely, or 4000 units if you already take the 2000 units.    Please continue all other medications as before, and refills have been done if requested.  Please have the pharmacy call with any other refills you may need.  Please continue your efforts at being more active, low cholesterol diet, and weight control.  You are otherwise up to date with prevention measures today.  Please keep your appointments with your specialists as you may have planned - endocrinology  Please go to the LAB at the blood drawing area for the tests to be done - just the urine testing today  Please make an Appointment to return for your 1 year visit, or sooner if needed, with Lab testing by Appointment as well, to be done about 3-5 days before at the FIRST FLOOR Lab (so this is for TWO appointments - please see the scheduling desk as you leave)  Due to the ongoing Covid 19 pandemic, our lab now requires an appointment for any labs done at our office.  If you need labs done and do not have an appointment, please call our office ahead of time to schedule before presenting to the lab for your testing.

## 2020-12-15 NOTE — Progress Notes (Signed)
Patient ID: Gregory Owens, male   DOB: May 26, 1970, 50 y.o.   MRN: [id number]         Chief Complaint:: wellness exam and low vit d, dm, low testosterone, sleep apnea, dark frothy urine recent        HPI:  Gregory Owens is a 50 y.o. male here for wellness exam; declines pneumovax, ok for shingles #1, o/w up tod ate                        Also not taking Vit D,  Pt denies chest pain, increased sob or doe, wheezing, orthopnea, PND, increased LE swelling, palpitations, dizziness or syncope.   Pt denies polydipsia, polyuria, or low sugar symptoms.  No new neuro s/s.  Conts to see endo also for DM and low testosterone.  [place] for cardiac Ct score.  Needs referral for pulm OSA follow up, as has not been seen for many years, is s/p uvulectomy years ago.  Denies urinary symptoms such as dysuria, frequency, urgency, flank pain, hematuria or n/v, fever, chills, but has somewhat darker kind of frothy urine in the past week several times.   Pt denies fever, unusual wt loss, night sweats, loss of appetite, or other constitutional symptoms        Wt Readings from Last 3 Encounters:  12/15/20 258 lb (117 kg)  11/16/20 265 lb (120.2 kg)  03/29/20 252 lb (114.3 kg)   BP Readings from Last 3 Encounters:  12/15/20 118/60  11/19/20 120/68  11/16/20 109/73   Immunization History  Administered Date(s) Administered   Influenza Split 12/12/2010   Influenza Whole 11/11/2009   Influenza, Seasonal, Injecte, Preservative Fre 12/25/2011   Influenza,inj,Quad PF,6+ Mos 10/21/2012, 11/04/2013, 03/10/2015, 10/10/2016, 10/16/2017, 11/12/2018   Influenza-Unspecified 11/30/2015, 11/24/2019, 10/27/2020   PFIZER(Purple Top)SARS-COV-2 Vaccination 02/13/2019, 03/10/2019, 10/27/2020   Pneumococcal Polysaccharide-23 03/10/2015   Tdap 08/19/2012   Zoster Recombinat (Shingrix) 12/15/2020   Health Maintenance Due  Topic Date Due   HEMOGLOBIN A1C  06/13/2020      Past Medical History:  Diagnosis Date    Abnormal EKG    NS ST-T EKG changes with negative Stress cardiolite 2003   Anxiety    Dehydration 2005   [place] , [place]   Diabetes mellitus without complication ([place])    Hyperglycemia    Hyperlipidemia    IBS (irritable bowel syndrome)    OSA (obstructive sleep apnea)    resolved post surgery   Past Surgical History:  Procedure Laterality Date   CHOLECYSTECTOMY N/A 06/07/2019   Procedure: LAPAROSCOPIC CHOLECYSTECTOMY;  Surgeon: Ileana Roup, MD;  Location: [facility] OR;  Service: General;  Laterality: N/A;   TONSILLECTOMY AND ADENOIDECTOMY      with above   UVULOPALATOPHARYNGOPLASTY  2004   Post op bleeding complication;  Dr Clotilde Dieter TOOTH EXTRACTION      reports that he has never smoked. He has never used smokeless tobacco. He reports that he does not drink alcohol and does not use drugs. family history includes Cirrhosis in his brother; Colon cancer in his paternal grandfather; Diabetes in his maternal grandmother and paternal grandmother; Heart attack (age of onset: 64) in his maternal grandmother and paternal grandmother; Hyperlipidemia in his father and mother; Hypertension in his father and mother; Other in his mother. Allergies  Allergen Reactions   Sulfa Antibiotics Other (See Comments)   Current Outpatient Medications on File Prior to Visit  Medication Sig Dispense Refill   aspirin 81  MG EC tablet TAKE 1 TABLET (81 MG TOTAL) BY MOUTH DAILY. 90 tablet 3   Blood Glucose Monitoring Suppl (BAYER CONTOUR NEXT MONITOR) w/Device KIT Use as directed to check blood sugars daily Dx e11.9 1 kit 0   cetirizine (ZYRTEC) 10 MG tablet Take 10 mg by mouth daily.     Continuous Blood Gluc Receiver (FREESTYLE LIBRE READER) DEVI Use as directed daily continuos E11.9 1 each 0   Continuous Blood Gluc Sensor (FREESTYLE LIBRE SENSOR SYSTEM) MISC Use as directed continous daily every 14 days E11.9 6 each 3   cyclobenzaprine (FLEXERIL) 5 MG tablet Take 1 tablet (5 mg total) by mouth 3 (three)  times daily as needed for muscle spasms. 30 tablet 1   famotidine (PEPCID) 20 MG tablet Take 1 tablet (20 mg total) by mouth at bedtime. 90 tablet 3   fluticasone (FLONASE) 50 MCG/ACT nasal spray Place 2 sprays into both nostrils daily. 16 g 6   glucose blood test strip Use as instructed twice a day E 11.9 Contour Next Strips 100 each 2   ketoconazole (NIZORAL) 2 % shampoo Apply 1 application topically 2 (two) times a week. 120 mL 6   losartan (COZAAR) 25 MG tablet TAKE 1 TABLET BY MOUTH  DAILY 90 tablet 1   metFORMIN (GLUCOPHAGE-XR) 500 MG 24 hr tablet TAKE 4 TABLETS BY MOUTH ONCE DAILY WITH BREAKFAST 360 tablet 1   pantoprazole (PROTONIX) 40 MG tablet Take 1 tablet (40 mg total) by mouth daily. 90 tablet 1   Probiotic Product (ALIGN PO) Take 1 capsule by mouth daily.      rosuvastatin (CRESTOR) 10 MG tablet Take 1 tablet (10 mg total) by mouth daily. 90 tablet 1   solifenacin (VESICARE) 5 MG tablet Take 1 tablet (5 mg total) by mouth daily. 90 tablet 1   venlafaxine XR (EFFEXOR XR) 75 MG 24 hr capsule Take 3 capsules (225 mg total) by mouth daily with breakfast. 270 capsule 3   vitamin B-12 (CYANOCOBALAMIN) 1000 MCG tablet Take 1 tablet (1,000 mcg total) by mouth daily. 90 tablet 3   benzonatate (TESSALON) 100 MG capsule Take 1 capsule (100 mg total) by mouth 3 (three) times daily as needed. (Patient not taking: Reported on 11/16/2020) 30 capsule 0   Current Facility-Administered Medications on File Prior to Visit  Medication Dose Route Frequency Provider Last Rate Last Admin   testosterone cypionate (DEPOTESTOSTERONE CYPIONATE) injection 200 mg  200 mg Intramuscular Q14 Days Biagio Borg, MD   200 mg at 05/20/19 1600        ROS:  All others reviewed and negative.  Objective        PE:  BP 118/60 (BP Location: Right Arm, Patient Position: Sitting, Cuff Size: Large)   Pulse (!) 101   Temp 98.6 F (37 C) (Oral)   Ht '5\' 7"'  (1.702 m)   Wt 258 lb (117 kg)   SpO2 97%   BMI 40.41 kg/m                  Constitutional: Pt appears in NAD               HENT: Head: NCAT.                Right Ear: External ear normal.                 Left Ear: External ear normal.  Eyes: . Pupils are equal, round, and reactive to light. Conjunctivae and EOM are normal               Nose: without d/c or deformity               Neck: Neck supple. Gross normal ROM               Cardiovascular: Normal rate and regular rhythm.                 Pulmonary/Chest: Effort normal and breath sounds without rales or wheezing.                Abd:  Soft, NT, ND, + BS, no organomegaly               Neurological: Pt is alert. At baseline orientation, motor grossly intact               Skin: Skin is warm. No rashes, no other new lesions, LE edema - none               Psychiatric: Pt behavior is normal without agitation   Micro: none  Cardiac tracings I have personally interpreted today:  none  Pertinent Radiological findings (summarize): none   Lab Results  Component Value Date   WBC 11.7 (H) 11/19/2020   HGB 14.7 11/19/2020   HCT 45.2 11/19/2020   PLT 252 11/19/2020   GLUCOSE 140 (H) 11/19/2020   CHOL 112 12/15/2019   TRIG 130.0 12/15/2019   HDL 36.90 (L) 12/15/2019   LDLDIRECT 53.0 08/29/2017   LDLCALC 50 12/15/2019   ALT 12 11/19/2020   AST 19 11/19/2020   NA 139 11/19/2020   K 4.5 11/19/2020   CL 102 11/19/2020   CREATININE 1.03 11/19/2020   BUN 11 11/19/2020   CO2 27 11/19/2020   TSH 1.64 12/15/2019   PSA 0.64 12/15/2019   HGBA1C 6.7 (H) 12/15/2019   MICROALBUR 2.0 (H) 12/15/2019   Assessment/Plan:  Zebediah Varun Jourdan is a 50 y.o. Black or African American [2] male with  has a past medical history of Abnormal EKG, Anxiety, Dehydration (2005), Diabetes mellitus without complication ([place]), Hyperglycemia, Hyperlipidemia, IBS (irritable bowel syndrome), and OSA (obstructive sleep apnea).  Vitamin D deficiency Last vitamin D Lab Results  Component Value Date   VD25OH  26.09 (L) 12/15/2019   Low, to start oral replacement   Encounter for well adult exam with abnormal findings Age and sex appropriate education and counseling updated with regular exercise and diet Referrals for preventative services - none needed Immunizations addressed - declines pneumovax, ok for shingles #1 and f/u #2 at 2 months Smoking counseling  - none needed Evidence for depression or other mood disorder - none significant Most recent labs reviewed. I have personally reviewed and have noted: 1) the patient's medical and social history 2) The patient's current medications and supplements 3) The patient's height, weight, and BMI have been recorded in the chart   Hyperlipidemia Lab Results  Component Value Date   LDLCALC 50 12/15/2019   Stable, pt to continue current statin crestor 10, also for cardiac CT score   Obstructive sleep apnea Uncontrolled, Will need pulm referral for f/u  Diabetes [facility]) Lab Results  Component Value Date   HGBA1C 6.7 (H) 12/15/2019   Mild uncontrolled but adequate, goal < 7, for f/u a1c with labs, f/u endo as planned,, pt to continue current medical treatment metformiin   Hypogonadism in male Uncontrolled low,  declines further tx today, f/u endo as planned  Dark urine ? Clinical significance - for urine study with labs,  to f/u any worsening symptoms or concerns  Followup: Return in about 1 year (around 12/15/2021).  Cathlean Cower, MD 12/16/2020 5:00 AM [facility]

## 2020-12-15 NOTE — Assessment & Plan Note (Signed)
Last vitamin D Lab Results  Component Value Date   VD25OH 26.09 (L) 12/15/2019   Low, to start oral replacement

## 2020-12-16 ENCOUNTER — Encounter: Payer: Self-pay | Admitting: Internal Medicine

## 2020-12-16 DIAGNOSIS — R82998 Other abnormal findings in urine: Secondary | ICD-10-CM | POA: Insufficient documentation

## 2020-12-16 LAB — URINE CULTURE: Result:: NO GROWTH

## 2020-12-16 NOTE — Assessment & Plan Note (Addendum)
Lab Results  Component Value Date   LDLCALC 50 12/15/2019   Stable, pt to continue current statin crestor 10, also for cardiac CT score

## 2020-12-16 NOTE — Addendum Note (Signed)
Addended by: Corwin Levins on: 12/16/2020 05:03 AM   Modules accepted: Orders

## 2020-12-16 NOTE — Assessment & Plan Note (Signed)
?   Clinical significance - for urine study with labs,  to f/u any worsening symptoms or concerns

## 2020-12-16 NOTE — Assessment & Plan Note (Addendum)
Uncontrolled, Will need pulm referral for f/u

## 2020-12-16 NOTE — Assessment & Plan Note (Signed)
Lab Results  Component Value Date   HGBA1C 6.7 (H) 12/15/2019   Mild uncontrolled but adequate, goal < 7, for f/u a1c with labs, f/u endo as planned,, pt to continue current medical treatment metformiin

## 2020-12-16 NOTE — Assessment & Plan Note (Signed)
Age and sex appropriate education and counseling updated with regular exercise and diet Referrals for preventative services - none needed Immunizations addressed - declines pneumovax, ok for shingles #1 and f/u #2 at 2 months Smoking counseling  - none needed Evidence for depression or other mood disorder - none significant Most recent labs reviewed. I have personally reviewed and have noted: 1) the patient's medical and social history 2) The patient's current medications and supplements 3) The patient's height, weight, and BMI have been recorded in the chart

## 2020-12-16 NOTE — Assessment & Plan Note (Signed)
Uncontrolled low, declines further tx today, f/u endo as planned

## 2020-12-27 ENCOUNTER — Emergency Department (HOSPITAL_BASED_OUTPATIENT_CLINIC_OR_DEPARTMENT_OTHER)
Admission: EM | Admit: 2020-12-27 | Discharge: 2020-12-27 | Discharge status: Against Medical Advice | Payer: 59 | Source: Home / Self Care

## 2020-12-27 ENCOUNTER — Other Ambulatory Visit: Payer: Self-pay

## 2020-12-28 ENCOUNTER — Emergency Department (HOSPITAL_BASED_OUTPATIENT_CLINIC_OR_DEPARTMENT_OTHER)
Admission: EM | Admit: 2020-12-28 | Discharge: 2020-12-28 | Disposition: A | Discharge status: Home / Self Care | Payer: 59 | Attending: Emergency Medicine | Admitting: Emergency Medicine

## 2020-12-28 ENCOUNTER — Other Ambulatory Visit: Payer: Self-pay

## 2020-12-28 ENCOUNTER — Emergency Department (HOSPITAL_BASED_OUTPATIENT_CLINIC_OR_DEPARTMENT_OTHER): Payer: 59

## 2020-12-28 ENCOUNTER — Encounter (HOSPITAL_BASED_OUTPATIENT_CLINIC_OR_DEPARTMENT_OTHER): Payer: Self-pay | Admitting: Emergency Medicine

## 2020-12-28 DIAGNOSIS — Z7982 Long term (current) use of aspirin: Secondary | ICD-10-CM | POA: Diagnosis not present

## 2020-12-28 DIAGNOSIS — M79605 Pain in left leg: Secondary | ICD-10-CM | POA: Insufficient documentation

## 2020-12-28 DIAGNOSIS — Z7984 Long term (current) use of oral hypoglycemic drugs: Secondary | ICD-10-CM | POA: Insufficient documentation

## 2020-12-28 DIAGNOSIS — R000 Tachycardia, unspecified: Secondary | ICD-10-CM | POA: Insufficient documentation

## 2020-12-28 DIAGNOSIS — E119 Type 2 diabetes mellitus without complications: Secondary | ICD-10-CM | POA: Diagnosis not present

## 2020-12-28 NOTE — ED Triage Notes (Signed)
Reports warm sensation to right lower leg on and off for about a week.  No swelling or pain.

## 2020-12-28 NOTE — Discharge Instructions (Signed)
Your work-up today did not show any signs of a blood clot.  Follow-up with your primary care doctor as needed if the sensation continues.  Return to the ED if you start having chest pain or feeling short of breath.

## 2020-12-28 NOTE — ED Provider Notes (Signed)
[place] [facility] EMERGENCY DEPT Provider Note   CSN: [id number] Arrival date & time: 12/28/20  1024     History No chief complaint on file.   Gregory Owens is a 50 y.o. male.  HPI  Patient presents with right leg warmth.  This been happening intermittently for about a week, happens 4-5 times a day.  No associated pain or swelling, the leg just feels warm.  Patient is concerned about having a blood clot, no history of the same and not on any anticoagulation.  No recent travel or surgeries.  No obvious alleviating factors, no obvious aggravating factors.  Not having any chest pain or shortness of breath.  Past Medical History:  Diagnosis Date   Abnormal EKG    NS ST-T EKG changes with negative Stress cardiolite 2003   Anxiety    Dehydration 2005   [place] , [place]   Diabetes mellitus without complication ([place])    Hyperglycemia    Hyperlipidemia    IBS (irritable bowel syndrome)    OSA (obstructive sleep apnea)    resolved post surgery    Patient Active Problem List   Diagnosis Date Noted   Dark urine 12/16/2020   Body mass index (BMI) 38.0-38.9, adult 03/29/2020   Hyperglycemia due to type 2 diabetes mellitus ([place]) 96/78/9381   Metabolic syndrome 01/75/1025   Nonalcoholic steatohepatitis ([facility]) 03/29/2020   Vitamin D deficiency 03/29/2020   Low back pain 03/29/2020   Right flank pain, chronic 02/08/2020   Right lower quadrant abdominal pain 12/20/2019   Hematochezia 07/09/2019   Cholecystitis 06/07/2019   S/P laparoscopic cholecystectomy 06/07/2019   Headache 02/08/2019   Urinary urgency 11/12/2018   Hypogonadism in male 09/04/2017   Bilateral foot pain 03/08/2017   Dysphagia 08/29/2016   ADHD 03/28/2016   Morbid obesity ([place]) 12/06/2015   Achrochordon 10/11/2015   Seborrheic dermatitis 10/11/2015   Encounter for well adult exam with abnormal findings 03/10/2015   Skin lesion 03/10/2015   GERD (gastroesophageal reflux disease) 11/04/2013    Tachycardia 11/04/2013   Nonspecific abnormal electrocardiogram (ECG) (EKG) 11/04/2013   Non-compliant behavior 10/14/2013   Diabetes ([place]) 08/19/2012   PLMD (periodic limb movement disorder) 05/30/2012   SLEEP DISORDER 12/05/2009   Hyperlipidemia 08/25/2009   GAD (generalized anxiety disorder) 08/25/2009   Obstructive sleep apnea 08/25/2009   RHINITIS 05/24/2009    Past Surgical History:  Procedure Laterality Date   CHOLECYSTECTOMY N/A 06/07/2019   Procedure: LAPAROSCOPIC CHOLECYSTECTOMY;  Surgeon: Ileana Roup, MD;  Location: [facility] OR;  Service: General;  Laterality: N/A;   TONSILLECTOMY AND ADENOIDECTOMY      with above   UVULOPALATOPHARYNGOPLASTY  2004   Post op bleeding complication;  Dr Janace Hoard   WISDOM TOOTH EXTRACTION         Family History  Problem Relation Age of Onset   Hyperlipidemia Mother    Hypertension Mother    Other Mother        Inflammatory Occipital Lymphadenitis   Hypertension Father    Hyperlipidemia Father    Diabetes Maternal Grandmother    Heart attack Maternal Grandmother 29   Diabetes Paternal Grandmother    Heart attack Paternal Grandmother 65   Cirrhosis Brother        autoimmune hepatitis (twin)   Colon cancer Paternal Grandfather     Social History   Tobacco Use   Smoking status: Never   Smokeless tobacco: Never  Vaping Use   Vaping Use: Never used  Substance Use Topics   Alcohol use: No  Drug use: No    Home Medications Prior to Admission medications   Medication Sig Start Date End Date Taking? Authorizing Provider  aspirin 81 MG EC tablet TAKE 1 TABLET (81 MG TOTAL) BY MOUTH DAILY. 03/25/17  Yes Biagio Borg, MD  cetirizine (ZYRTEC) 10 MG tablet Take 10 mg by mouth daily.   Yes [provider]  cyclobenzaprine (FLEXERIL) 5 MG tablet Take 1 tablet (5 mg total) by mouth 3 (three) times daily as needed for muscle spasms. 03/29/20  Yes Biagio Borg, MD  famotidine (PEPCID) 20 MG tablet Take 1 tablet (20 mg total) by  mouth at bedtime. 07/15/19  Yes Biagio Borg, MD  fluticasone [facility]) 50 MCG/ACT nasal spray Place 2 sprays into both nostrils daily. 08/18/20  Yes Mar Daring, PA-C  ketoconazole (NIZORAL) 2 % shampoo Apply 1 application topically 2 (two) times a week. 12/01/20  Yes Biagio Borg, MD  losartan (COZAAR) 25 MG tablet TAKE 1 TABLET BY MOUTH  DAILY 08/04/20  Yes Biagio Borg, MD  metFORMIN (GLUCOPHAGE-XR) 500 MG 24 hr tablet TAKE 4 TABLETS BY MOUTH ONCE DAILY WITH BREAKFAST 08/04/20  Yes Biagio Borg, MD  pantoprazole (PROTONIX) 40 MG tablet Take 1 tablet (40 mg total) by mouth daily. 08/04/20  Yes Biagio Borg, MD  Probiotic Product (ALIGN PO) Take 1 capsule by mouth daily.    Yes [provider]  rosuvastatin (CRESTOR) 10 MG tablet Take 1 tablet (10 mg total) by mouth daily. 08/04/20  Yes Biagio Borg, MD  solifenacin (VESICARE) 5 MG tablet Take 1 tablet (5 mg total) by mouth daily. 08/04/20  Yes Biagio Borg, MD  venlafaxine XR (EFFEXOR XR) 75 MG 24 hr capsule Take 3 capsules (225 mg total) by mouth daily with breakfast. 05/26/20  Yes Biagio Borg, MD  benzonatate (TESSALON) 100 MG capsule Take 1 capsule (100 mg total) by mouth 3 (three) times daily as needed. Patient not taking: Reported on 11/16/2020 08/18/20   Mar Daring, PA-C  Blood Glucose Monitoring Suppl (BAYER CONTOUR NEXT MONITOR) w/Device KIT Use as directed to check blood sugars daily Dx e11.9 04/20/15   Biagio Borg, MD  Continuous Blood Gluc Receiver (FREESTYLE LIBRE READER) DEVI Use as directed daily continuos E11.9 09/12/20   Biagio Borg, MD  Continuous Blood Gluc Sensor ([place]) MISC Use as directed continous daily every 14 days E11.9 09/12/20   Biagio Borg, MD  glucose blood test strip Use as instructed twice a day E 11.9 Contour Next Strips 05/25/19   Biagio Borg, MD  vitamin B-12 (CYANOCOBALAMIN) 1000 MCG tablet Take 1 tablet (1,000 mcg total) by mouth daily. Patient not taking:  Reported on 12/28/2020 07/20/19   Biagio Borg, MD    Allergies    Sulfa antibiotics  Review of Systems   Review of Systems  Constitutional:  Negative for fever.  Respiratory:  Negative for shortness of breath.   Cardiovascular:  Negative for chest pain.  Musculoskeletal:        Leg warmth  Skin:  Negative for color change.  Neurological:  Negative for weakness and numbness.   Physical Exam Updated Vital Signs BP (!) 127/92 (BP Location: Right Arm)   Pulse (!) 110   Temp 98.2 F (36.8 C) (Oral)   Resp 18   Ht '5\' 7"'  (1.702 m)   Wt 117 kg   SpO2 98%   BMI 40.40 kg/m   Physical Exam  Vitals and nursing note reviewed. Exam conducted with a chaperone present.  Constitutional:      Appearance: Normal appearance.  HENT:     Head: Normocephalic and atraumatic.  Eyes:     General: No scleral icterus.       Right eye: No discharge.        Left eye: No discharge.     Extraocular Movements: Extraocular movements intact.     Pupils: Pupils are equal, round, and reactive to light.  Cardiovascular:     Rate and Rhythm: Regular rhythm. Tachycardia present.     Pulses: Normal pulses.     Heart sounds: Normal heart sounds. No murmur heard.   No friction rub. No gallop.     Comments: Regular rhythm, radial pulse 2+ equal bilaterally.  Mild tachycardia without any murmurs or gallops or rubs.  DP and PT are 2+ bilaterally Pulmonary:     Effort: Pulmonary effort is normal. No respiratory distress.     Breath sounds: Normal breath sounds.  Abdominal:     General: Abdomen is flat. Bowel sounds are normal. There is no distension.     Palpations: Abdomen is soft.     Tenderness: There is no abdominal tenderness.  Musculoskeletal:        General: No tenderness. Normal range of motion.     Comments: Lower extremities are roughly symmetric bilaterally.  Negative [place], range of motion to knee, ankle, hip fully intact.  Skin:    General: Skin is warm and dry.     Capillary Refill:  Capillary refill takes less than 2 seconds.     Coloration: Skin is not jaundiced.     Findings: No erythema or rash.     Comments: No erythema or skin discoloration or rashes to the left lower extremity.  Neurological:     Mental Status: He is alert. Mental status is at baseline.     Coordination: Coordination normal.    ED Results / Procedures / Treatments   Labs (all labs ordered are listed, but only abnormal results are displayed) Labs Reviewed - No data to display  EKG None  Radiology US Venous Img Lower Unilateral Right  Result Date: 12/28/2020 CLINICAL DATA:  Intermittent warm sensation of right lower extremity for proximally 1 week EXAM: RIGHT LOWER EXTREMITY VENOUS DOPPLER ULTRASOUND TECHNIQUE: Gray-scale sonography with compression, as well as color and duplex ultrasound, were performed to evaluate the deep venous system(s) from the level of the common femoral vein through the popliteal and proximal calf veins. COMPARISON:  None. FINDINGS: VENOUS Normal compressibility of the common femoral, superficial femoral, and popliteal veins, as well as the visualized calf veins. Visualized portions of profunda femoral vein and great saphenous vein unremarkable. No filling defects to suggest DVT on grayscale or color Doppler imaging. Doppler waveforms show normal direction of venous flow, normal respiratory plasticity and response to augmentation. Limited views of the contralateral common femoral vein are unremarkable. OTHER None. Limitations: none IMPRESSION: No right lower extremity DVT. Electronically Signed   By: Miachel Roux M.D.   On: 12/28/2020 12:49    Procedures Procedures   Medications Ordered in ED Medications - No data to display  ED Course  I have reviewed the triage vital signs and the nursing notes.  Pertinent labs & imaging results that were available during my care of the patient were reviewed by me and considered in my medical decision making (see chart for  details).    MDM Rules/Calculators/A&P  Patient is mildly tachycardic, vitals are stable otherwise.  He has strong pulses, good range of motion without any skin changes.  Doubt cellulitis, doubt musculoskeletal injury.  Denies any numbness or tingling, no history of radiculopathy without sciatica.  Compartments are soft, not compartment syndrome.  Patient is very concerned about blood clots, although patient with physical exam of the low suspicion he is tachycardic setting is reasonable to get the ultrasound.  Ultrasound is negative for any DVT.  Doubt any emergent pathology requiring additional work-up at this time.  Although he does describe some more intermittently, does not seem consistent with infectious process.  Do not think antibiotics would be beneficial.  Patient discharged in stable condition.  Final Clinical Impression(s) / ED Diagnoses Final diagnoses:  Left leg pain    Rx / DC Orders ED Discharge Orders     None        Sherrill Raring, PA-C 12/28/20 [place], DO 12/28/20 1438

## 2021-01-02 ENCOUNTER — Inpatient Hospital Stay: Admission: RE | Admit: 2021-01-02 | Payer: 59 | Source: Ambulatory Visit

## 2021-01-04 NOTE — Progress Notes (Signed)
01/05/21- 5 yoM never smoker for sleep evaluation courtesy of Dr Jenny Reichmann with concern of OSA Medical problem list includes OSA, PLMA, Rhinitis, IBS, GERD, Dysphagia, NASH, DM2, Hyperlipidemia, Anxiety, Morbid Obesity, ADHD, Obesity,  NPSG 05/24/02- AHI 87/ hr, desaturation to 70%, body weight 203 lbs Epworth score- Body weight today- Covid vax-3 Phizer Flu vax-had -----Patient reports that he had some nose surgery and uvula removed in the past and was told he may have sleep apnea Sleep study 15 years ago led to ENT surgery as above.  In recent years he is aware of persistent daytime tiredness.  Told of snoring.  Wife also says he "rides my bicycle in my sleep" with active leg movement.  There is no family history of dementia or Parkinson's disease.  No sleep medicines.  Does take Zyrtec for itching.  No lung or heart problems.  Little caffeine.  Prior to Admission medications   Medication Sig Start Date End Date Taking? Authorizing Provider  aspirin 81 MG EC tablet TAKE 1 TABLET (81 MG TOTAL) BY MOUTH DAILY. 03/25/17  Yes Biagio Borg, MD  Blood Glucose Monitoring Suppl (BAYER CONTOUR NEXT MONITOR) w/Device KIT Use as directed to check blood sugars daily Dx e11.9 04/20/15  Yes Biagio Borg, MD  cetirizine (ZYRTEC) 10 MG tablet Take 10 mg by mouth daily.   Yes [provider]  Continuous Blood Gluc Receiver (FREESTYLE LIBRE READER) DEVI Use as directed daily continuos E11.9 09/12/20  Yes Biagio Borg, MD  Continuous Blood Gluc Sensor (Pepin) MISC Use as directed continous daily every 14 days E11.9 09/12/20  Yes Biagio Borg, MD  cyclobenzaprine (FLEXERIL) 5 MG tablet Take 1 tablet (5 mg total) by mouth 3 (three) times daily as needed for muscle spasms. 03/29/20  Yes Biagio Borg, MD  famotidine (PEPCID) 20 MG tablet Take 1 tablet (20 mg total) by mouth at bedtime. 07/15/19  Yes Biagio Borg, MD  fluticasone [facility]) 50 MCG/ACT nasal spray Place 2 sprays into both  nostrils daily. 08/18/20  Yes Fenton Malling M, PA-C  glucose blood test strip Use as instructed twice a day E 11.9 Contour Next Strips 05/25/19  Yes Biagio Borg, MD  ketoconazole (NIZORAL) 2 % shampoo Apply 1 application topically 2 (two) times a week. 12/01/20  Yes Biagio Borg, MD  losartan (COZAAR) 25 MG tablet TAKE 1 TABLET BY MOUTH  DAILY 08/04/20  Yes Biagio Borg, MD  metFORMIN (GLUCOPHAGE-XR) 500 MG 24 hr tablet TAKE 4 TABLETS BY MOUTH ONCE DAILY WITH BREAKFAST 08/04/20  Yes Biagio Borg, MD  OZEMPIC, 0.25 OR 0.5 MG/DOSE, 2 MG/1.5ML SOPN Inject 0.5 mg into the skin once a week. 12/01/20  Yes [provider]  pantoprazole (PROTONIX) 40 MG tablet Take 1 tablet (40 mg total) by mouth daily. 08/04/20  Yes Biagio Borg, MD  Probiotic Product (ALIGN PO) Take 1 capsule by mouth daily.    Yes [provider]  rosuvastatin (CRESTOR) 10 MG tablet Take 1 tablet (10 mg total) by mouth daily. 08/04/20  Yes Biagio Borg, MD  solifenacin (VESICARE) 5 MG tablet Take 1 tablet (5 mg total) by mouth daily. 08/04/20  Yes Biagio Borg, MD  venlafaxine XR (EFFEXOR XR) 75 MG 24 hr capsule Take 3 capsules (225 mg total) by mouth daily with breakfast. 05/26/20  Yes Biagio Borg, MD   Past Medical History:  Diagnosis Date   Abnormal EKG    NS ST-T EKG  changes with negative Stress cardiolite 2003   Anxiety    Dehydration 2005   [place] , [place]   Diabetes mellitus without complication ([place])    Hyperglycemia    Hyperlipidemia    IBS (irritable bowel syndrome)    OSA (obstructive sleep apnea)    resolved post surgery   Past Surgical History:  Procedure Laterality Date   CHOLECYSTECTOMY N/A 06/07/2019   Procedure: LAPAROSCOPIC CHOLECYSTECTOMY;  Surgeon: Ileana Roup, MD;  Location: [facility];  Service: General;  Laterality: N/A;   TONSILLECTOMY AND ADENOIDECTOMY      with above   UVULOPALATOPHARYNGOPLASTY  2004   Post op bleeding complication;  Dr Clotilde Dieter TOOTH EXTRACTION      Family History  Problem Relation Age of Onset   Hyperlipidemia Mother    Hypertension Mother    Other Mother        Inflammatory Occipital Lymphadenitis   Hypertension Father    Hyperlipidemia Father    Diabetes Maternal Grandmother    Heart attack Maternal Grandmother 7   Diabetes Paternal Grandmother    Heart attack Paternal Grandmother 40   Cirrhosis Brother        autoimmune hepatitis (twin)   Colon cancer Paternal Grandfather    Social History   Socioeconomic History   Marital status: Married    Spouse name: Not on file   Number of children: Not on file   Years of education: Not on file   Highest education level: Not on file  Occupational History   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  Tobacco Use   Smoking status: Never   Smokeless tobacco: Never  Vaping Use   Vaping Use: Never used  Substance and Sexual Activity   Alcohol use: No   Drug use: No   Sexual activity: Not on file  Other Topics Concern   Not on file  Social History Narrative   Regular exercise: no            Social Determinants of Health   Financial Resource Strain: Not on file  Food Insecurity: Not on file  Transportation Needs: Not on file  Physical Activity: Not on file  Stress: Not on file  Social Connections: Not on file  Intimate Partner Violence: Not on file   ROS-see HPI   + = positive Constitutional:    weight loss, night sweats, fevers, chills, +fatigue, lassitude. HEENT:    headaches, difficulty swallowing, tooth/dental problems, sore throat,       sneezing, itching, ear ache, nasal congestion, post nasal drip, snoring CV:    chest pain, orthopnea, PND, swelling in lower extremities, anasarca,                                  dizziness, palpitations Resp:   shortness of breath with exertion or at rest.                productive cough,   non-productive cough, coughing up of blood.              change in color of mucus.  wheezing.   Skin:    rash or  lesions. GI:  No-   heartburn, indigestion, abdominal pain, nausea, vomiting, diarrhea,                 change in bowel habits, loss of appetite GU: dysuria, change in color of urine, no urgency  or frequency.   flank pain. MS:   joint pain, stiffness, decreased range of motion, back pain. Neuro-     nothing unusual Psych:  change in mood or affect.  depression or anxiety.   memory loss.   OBJ- Physical Exam General- Alert, Oriented, Affect-appropriate, Distress- none acute, + morbidly obese Skin- rash-none, lesions- none, excoriation- none Lymphadenopathy- none Head- atraumatic            Eyes- Gross vision intact, PERRLA, conjunctivae and secretions clear            Ears- Hearing, canals-normal            Nose- Clear, no-Septal dev, mucus, polyps, erosion, perforation             Throat- +LAUP , mucosa clear , drainage- none, tonsils- atrophic, + teeth Neck- flexible , trachea midline, no stridor , thyroid nl, carotid no bruit Chest - symmetrical excursion , unlabored           Heart/CV- RRR , no murmur , no gallop  , no rub, nl s1 s2                           - JVD- none , edema- none, stasis changes- none, varices- none           Lung- clear to P&A, wheeze- none, cough- none , dullness-none, rub- none           Chest wall-  Abd-  Br/ Gen/ Rectal- Not done, not indicated Extrem- cyanosis- none, clubbing, none, atrophy- none, strength- nl Neuro- grossly intact to observation

## 2021-01-05 ENCOUNTER — Ambulatory Visit (INDEPENDENT_AMBULATORY_CARE_PROVIDER_SITE_OTHER): Payer: 59 | Admitting: Internal Medicine

## 2021-01-05 ENCOUNTER — Other Ambulatory Visit: Payer: Self-pay

## 2021-01-05 ENCOUNTER — Encounter: Payer: Self-pay | Admitting: Internal Medicine

## 2021-01-05 DIAGNOSIS — G4733 Obstructive sleep apnea (adult) (pediatric): Secondary | ICD-10-CM | POA: Diagnosis not present

## 2021-01-05 DIAGNOSIS — G4761 Periodic limb movement disorder: Secondary | ICD-10-CM

## 2021-01-05 NOTE — Assessment & Plan Note (Signed)
He is not anemic.  There may be some connection to diabetic peripheral neuropathy if relevant.  If necessary we can offer medication.

## 2021-01-05 NOTE — Assessment & Plan Note (Addendum)
Likely still has significant obstructive sleep apnea and also describes disruptive limb movement and sleep. Plan-sleep study.  Home sleep tests are delayed and his limb movement should be recorded so I would prefer to do this as a split-night study at the sleep disorder center.  He is agreeable.

## 2021-01-05 NOTE — Patient Instructions (Signed)
Order- schedule split night sleep study at sleep center     dx OSA, Limb movement sleep disorder  Please call for results and recommendations about 2 weeks after your sleep test

## 2021-01-17 ENCOUNTER — Other Ambulatory Visit: Payer: Self-pay | Admitting: Internal Medicine

## 2021-01-18 NOTE — Telephone Encounter (Signed)
Please refill as per office routine med refill policy (all routine meds to be refilled for 3 mo or monthly (per pt preference) up to one year from last visit, then month to month grace period for 3 mo, then further med refills will have to be denied) ? ?

## 2021-01-24 ENCOUNTER — Encounter: Payer: Self-pay | Admitting: Internal Medicine

## 2021-01-24 NOTE — Telephone Encounter (Signed)
Pt will need ROV for multiple concerns in one mychart message    thanks

## 2021-01-25 NOTE — Telephone Encounter (Signed)
Pt is sched to been seen at one of our other offices for 12/29 with Shirline Frees, NP

## 2021-01-26 ENCOUNTER — Other Ambulatory Visit: Payer: Self-pay | Admitting: Adult Health

## 2021-01-26 ENCOUNTER — Encounter: Payer: Self-pay | Admitting: Adult Health

## 2021-01-26 ENCOUNTER — Ambulatory Visit (INDEPENDENT_AMBULATORY_CARE_PROVIDER_SITE_OTHER): Payer: 59 | Admitting: Adult Health

## 2021-01-26 VITALS — BP 126/74 | HR 100 | Temp 99.0°F | Ht 67.0 in | Wt 250.0 lb

## 2021-01-26 DIAGNOSIS — R748 Abnormal levels of other serum enzymes: Secondary | ICD-10-CM | POA: Diagnosis not present

## 2021-01-26 LAB — HEPATIC FUNCTION PANEL
ALT: 118 U/L — ABNORMAL HIGH (ref 0–53)
AST: 37 U/L (ref 0–37)
Albumin: 4 g/dL (ref 3.5–5.2)
Alkaline Phosphatase: 230 U/L — ABNORMAL HIGH (ref 39–117)
Bilirubin, Direct: 0.2 mg/dL (ref 0.0–0.3)
Total Bilirubin: 0.6 mg/dL (ref 0.2–1.2)
Total Protein: 7.8 g/dL (ref 6.0–8.3)

## 2021-01-26 LAB — AMYLASE: Amylase: 53 U/L (ref 27–131)

## 2021-01-26 LAB — LIPASE: Lipase: 97 U/L — ABNORMAL HIGH (ref 11.0–59.0)

## 2021-01-26 NOTE — Progress Notes (Signed)
Subjective:    Patient ID: Gregory Owens, male    DOB: [birth year], 50 y.o.   MRN: [id number]  HPI 50 year old male who  has a past medical history of Abnormal EKG, Anxiety, Dehydration (2005), Diabetes mellitus without complication ([place]), Hyperglycemia, Hyperlipidemia, IBS (irritable bowel syndrome), and OSA (obstructive sleep apnea).  He is a patient of Dr. Cathlean Cower, who I am seeing today for the first time.   Per patient report he was seen by his Endocrinologist last week and had blood work done which showed elevated liver enzymes Alk Phos 330, ALT 552, AST 408   He was advised by endocrinology to stop Clomid, metformin, ozempic, and crestor.   Does not use tylenol daily, does not drink alcohol, or smoke.   Reports being anxious and would like to recheck his labs  Does report some increase in GERD symptoms over the last few weeks.   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  Tobacco Use   Smoking status: Never   Smokeless tobacco: Never  Vaping Use   Vaping Use: Never used  Substance and Sexual Activity   Alcohol use: No   Drug use: No   Sexual activity: Not on file  Other Topics Concern   Not on file  Social History Narrative   Regular exercise: no            Social Determinants of Health   Financial Resource Strain: Not on file  Food  Insecurity: Not on file  Transportation Needs: Not on file  Physical Activity: Not on file  Stress: Not on file  Social Connections: Not on file  Intimate Partner Violence: Not on file    Past Surgical History:  Procedure Laterality Date   CHOLECYSTECTOMY N/A 06/07/2019   Procedure: LAPAROSCOPIC CHOLECYSTECTOMY;  Surgeon: Ileana Roup, MD;  Location: [facility];  Service: General;  Laterality: N/A;   TONSILLECTOMY AND ADENOIDECTOMY      with above   UVULOPALATOPHARYNGOPLASTY  2004   Post op bleeding complication;  Dr Janace Hoard   WISDOM TOOTH EXTRACTION      Family History  Problem Relation Age of Onset   Hyperlipidemia Mother    Hypertension Mother    Other Mother        Inflammatory Occipital Lymphadenitis   Hypertension Father    Hyperlipidemia Father    Diabetes Maternal Grandmother    Heart attack Maternal Grandmother 60   Diabetes Paternal Grandmother    Heart attack Paternal Grandmother 59   Cirrhosis Brother        autoimmune hepatitis (twin)   Colon cancer Paternal Grandfather     Allergies  Allergen Reactions   Sulfa Antibiotics Other (See Comments)    Patient is not sure  what reaction.     Current Outpatient Medications on File Prior to Visit  Medication Sig Dispense Refill   aspirin 81 MG EC tablet TAKE 1 TABLET (81 MG TOTAL) BY MOUTH DAILY. 90 tablet 3   Blood Glucose Monitoring Suppl (BAYER CONTOUR NEXT MONITOR) w/Device KIT Use as directed to check blood sugars daily Dx e11.9 1 kit 0   cetirizine (ZYRTEC) 10 MG tablet Take 10 mg by mouth daily.     Continuous Blood Gluc Receiver (FREESTYLE LIBRE READER) DEVI Use as directed daily continuos E11.9 1 each 0   Continuous Blood Gluc Sensor (FREESTYLE LIBRE SENSOR SYSTEM) MISC Use as directed continous daily every 14 days E11.9 6 each 3   cyclobenzaprine (FLEXERIL) 5 MG tablet Take 1 tablet (5 mg total) by mouth 3 (three) times daily as needed for muscle spasms. 30 tablet 1   famotidine (PEPCID) 20 MG tablet  Take 1 tablet (20 mg total) by mouth at bedtime. 90 tablet 3   fluticasone (FLONASE) 50 MCG/ACT nasal spray Place 2 sprays into both nostrils daily. 16 g 6   glucose blood test strip Use as instructed twice a day E 11.9 Contour Next Strips 100 each 2   ketoconazole (NIZORAL) 2 % shampoo Apply 1 application topically 2 (two) times a week. 120 mL 6   losartan (COZAAR) 25 MG tablet TAKE 1 TABLET BY MOUTH  DAILY 90 tablet 3   pantoprazole (PROTONIX) 40 MG tablet TAKE 1 TABLET BY MOUTH  DAILY 90 tablet 3   Probiotic Product (ALIGN PO) Take 1 capsule by mouth daily.      solifenacin (VESICARE) 5 MG tablet TAKE 1 TABLET BY MOUTH  DAILY 90 tablet 3   venlafaxine XR (EFFEXOR XR) 75 MG 24 hr capsule Take 3 capsules (225 mg total) by mouth daily with breakfast. 270 capsule 3   metFORMIN (GLUCOPHAGE-XR) 500 MG 24 hr tablet TAKE 4 TABLETS BY MOUTH  ONCE DAILY WITH BREAKFAST (Patient not taking: Reported on 01/26/2021) 360 tablet 3   OZEMPIC, 0.25 OR 0.5 MG/DOSE, 2 MG/1.5ML SOPN Inject 0.5 mg into the skin once a week. (Patient not taking: Reported on 01/26/2021)     rosuvastatin (CRESTOR) 10 MG tablet TAKE 1 TABLET BY MOUTH  DAILY (Patient not taking: Reported on 01/26/2021) 90 tablet 3   Current Facility-Administered Medications on File Prior to Visit  Medication Dose Route Frequency Provider Last Rate Last Admin   testosterone cypionate (DEPOTESTOSTERONE CYPIONATE) injection 200 mg  200 mg Intramuscular Q14 Days Biagio Borg, MD   200 mg at 05/20/19 1600    BP 126/74    Pulse 100    Temp 99 F (37.2 C) (Oral)    Ht '5\' 7"'  (1.702 m)    Wt 250 lb (113.4 kg)    SpO2 96%    BMI 39.16 kg/m       Objective:   Physical Exam Vitals and nursing note reviewed.  Constitutional:      Appearance: Normal appearance. He is well-developed. He is obese.  Pulmonary:     Effort: Pulmonary effort is normal.     Breath sounds: Normal breath sounds.  Abdominal:     General: Abdomen is flat. Bowel sounds are normal.      Palpations: Abdomen is soft.     Tenderness: There is abdominal tenderness in the epigastric area.  Skin:    General: Skin is warm and dry.     Capillary Refill: Capillary refill takes less than 2 seconds.  Neurological:  General: No focal deficit present.     Mental Status: He is alert and oriented to person, place, and time.  Psychiatric:        Mood and Affect: Mood normal.        Behavior: Behavior normal.        Thought Content: Thought content normal.        Judgment: Judgment normal.      Assessment & Plan:  1. Elevated liver enzymes - Advised that it can take upwards of a month to show improvement in liver enzymes.  - Follow up with PCP and/or endocrinology for future workup  - Hepatic function panel; Future - Lipase; Future - Hepatitis Acute Panel; Future - Amylase; Future - Amylase - Hepatitis Acute Panel - Lipase - Hepatic function panel  Dorothyann Peng, NP

## 2021-01-26 NOTE — Patient Instructions (Signed)
It was great meeting you today  Please follow up with your PCP in 3-4 weeks for repeat testing

## 2021-01-27 LAB — HEPATITIS PANEL, ACUTE
Hep A IgM: NONREACTIVE
Hep B C IgM: NONREACTIVE
Hepatitis B Surface Ag: NONREACTIVE
Hepatitis C Ab: NONREACTIVE
SIGNAL TO CUT-OFF: <0.02 (ref <1.00)

## 2021-01-31 ENCOUNTER — Other Ambulatory Visit: Payer: Self-pay | Admitting: Gastroenterology

## 2021-01-31 ENCOUNTER — Ambulatory Visit
Admission: RE | Admit: 2021-01-31 | Discharge: 2021-01-31 | Disposition: A | Discharge status: Home / Self Care | Payer: 59 | Source: Ambulatory Visit | Attending: Adult Health | Admitting: Adult Health

## 2021-01-31 DIAGNOSIS — R748 Abnormal levels of other serum enzymes: Secondary | ICD-10-CM

## 2021-02-02 ENCOUNTER — Other Ambulatory Visit: Payer: 59

## 2021-02-02 ENCOUNTER — Ambulatory Visit (HOSPITAL_BASED_OUTPATIENT_CLINIC_OR_DEPARTMENT_OTHER): Payer: 59 | Attending: Internal Medicine | Admitting: Internal Medicine

## 2021-02-02 ENCOUNTER — Telehealth: Payer: 59 | Admitting: Family Medicine

## 2021-02-02 ENCOUNTER — Other Ambulatory Visit: Payer: Self-pay

## 2021-02-02 DIAGNOSIS — G4733 Obstructive sleep apnea (adult) (pediatric): Secondary | ICD-10-CM | POA: Insufficient documentation

## 2021-02-02 DIAGNOSIS — R109 Unspecified abdominal pain: Secondary | ICD-10-CM

## 2021-02-02 DIAGNOSIS — G4761 Periodic limb movement disorder: Secondary | ICD-10-CM | POA: Insufficient documentation

## 2021-02-02 NOTE — Progress Notes (Signed)
Needs in person eval, questioning pancreatitis

## 2021-02-05 DIAGNOSIS — G4733 Obstructive sleep apnea (adult) (pediatric): Secondary | ICD-10-CM | POA: Diagnosis not present

## 2021-02-05 NOTE — Procedures (Signed)
° ° °  Patient Name: Gregory Owens, Gregory Owens Study Date: 02/02/2021 Gender: Male D.O.B: 10/14/1970 Age (years): 26 Referring Provider: Jetty Duhamel MD, ABSM Height (inches): 60 Interpreting Physician: Jetty Duhamel MD, ABSM Weight (lbs): 250 RPSGT: Cherylann Parr BMI: 49 MRN: [id number] Neck Size: 18.00  CLINICAL INFORMATION Sleep Study Type: NPSG Indication for sleep study: Obesity, Witnesses Apnea / Gasping During Sleep Epworth Sleepiness Score: 7  SLEEP STUDY TECHNIQUE As per the AASM Manual for the Scoring of Sleep and Associated Events v2.3 (April 2016) with a hypopnea requiring 4% desaturations.  The channels recorded and monitored were frontal, central and occipital EEG, electrooculogram (EOG), submentalis EMG (chin), nasal and oral airflow, thoracic and abdominal wall motion, anterior tibialis EMG, snore microphone, electrocardiogram, and pulse oximetry.  MEDICATIONS Medications self-administered by patient taken the night of the study : PEPCID, COZAAR, PROTONIX, ALIGN PO, EFFEXOR XR  SLEEP ARCHITECTURE The study was initiated at 10:50:55 PM and ended at 4:55:49 AM.  Sleep onset time was 52.2 minutes and the sleep efficiency was 51.7%%. The total sleep time was 188.7 minutes.  Stage REM latency was N/A minutes.  The patient spent 8.0%% of the night in stage N1 sleep, 92.0%% in stage N2 sleep, 0.0%% in stage N3 and 0% in REM.  Alpha intrusion was absent.  Supine sleep was 0.00%.  RESPIRATORY PARAMETERS The overall apnea/hypopnea index (AHI) was 8.0 per hour. There were 2 total apneas, including 2 obstructive, 0 central and 0 mixed apneas. There were 23 hypopneas and 0 RERAs.  The AHI during Stage REM sleep was N/A per hour.  AHI while supine was N/A per hour.  The mean oxygen saturation was 92.8%. The minimum SpO2 during sleep was 84.0%.  soft snoring was noted during this study.  CARDIAC DATA The 2 lead EKG demonstrated sinus rhythm, atrial fibrillation. The mean  heart rate was 77.7 beats per minute. Other EKG findings include: None.  LEG MOVEMENT DATA The total PLMS were 0 with a resulting PLMS index of 0.0. Associated arousal with leg movement index was 1.0 .  IMPRESSIONS - Mild obstructive sleep apnea occurred during this study (AHI = 8.0/h). - Mild oxygen desaturation was noted during this study (Min O2 = 84.0%).  Mean O2 saturation 92.8%. - Insufficient early sleep and events to meet protocol requirements for split CPAP titration. - The patient snored with soft snoring volume. - Sleep was markedly fragmented by frequent waking and arousals, with absent stages N3 and REM. - No cardiac abnormalities were noted during this study - Frequent limb movements with total 366 (116.4/ hr), although only 3 limb movements (1/ hr) were associated with arousal or awakening on this study night.  DIAGNOSIS - Obstructive Sleep Apnea (G47.33)  RECOMMENDATIONS - Treatment for mild OSA may be considered in conjunction with management decisons directed at Limb Movement and difficulty maintaining sleep. - Sleep hygiene should be reviewed to assess factors that may improve sleep quality. - Weight management and regular exercise should be initiated or continued if appropriate.  [Electronically signed] 02/05/2021 12:25 PM  Jetty Duhamel MD, ABSM Diplomate, American Board of Sleep Medicine   NPI: [id number]                         Jetty Duhamel Diplomate, American Board of Sleep Medicine  ELECTRONICALLY SIGNED ON:  02/05/2021, 12:15 PM [facility] PH: [phone]   FX: [phone] ACCREDITED BY THE AMERICAN ACADEMY OF SLEEP MEDICINE

## 2021-02-17 ENCOUNTER — Ambulatory Visit (INDEPENDENT_AMBULATORY_CARE_PROVIDER_SITE_OTHER): Payer: 59 | Admitting: Internal Medicine

## 2021-02-17 ENCOUNTER — Other Ambulatory Visit (INDEPENDENT_AMBULATORY_CARE_PROVIDER_SITE_OTHER): Payer: 59

## 2021-02-17 ENCOUNTER — Other Ambulatory Visit: Payer: Self-pay

## 2021-02-17 ENCOUNTER — Encounter: Payer: Self-pay | Admitting: Internal Medicine

## 2021-02-17 VITALS — BP 118/70 | HR 112 | Temp 98.9°F | Ht 68.0 in | Wt 254.0 lb

## 2021-02-17 DIAGNOSIS — E1165 Type 2 diabetes mellitus with hyperglycemia: Secondary | ICD-10-CM

## 2021-02-17 DIAGNOSIS — R748 Abnormal levels of other serum enzymes: Secondary | ICD-10-CM

## 2021-02-17 DIAGNOSIS — E559 Vitamin D deficiency, unspecified: Secondary | ICD-10-CM | POA: Diagnosis not present

## 2021-02-17 DIAGNOSIS — R7989 Other specified abnormal findings of blood chemistry: Secondary | ICD-10-CM | POA: Diagnosis not present

## 2021-02-17 DIAGNOSIS — G4733 Obstructive sleep apnea (adult) (pediatric): Secondary | ICD-10-CM | POA: Diagnosis not present

## 2021-02-17 LAB — CBC WITH DIFFERENTIAL/PLATELET
Basophils Absolute: 0 10*3/uL (ref 0.0–0.1)
Basophils Relative: 0.4 % (ref 0.0–3.0)
Eosinophils Absolute: 0.2 10*3/uL (ref 0.0–0.7)
Eosinophils Relative: 1.2 % (ref 0.0–5.0)
HCT: 43.5 % (ref 39.0–52.0)
Hemoglobin: 13.8 g/dL (ref 13.0–17.0)
Lymphocytes Relative: 15.6 % (ref 12.0–46.0)
Lymphs Abs: 2 10*3/uL (ref 0.7–4.0)
MCHC: 31.8 g/dL (ref 30.0–36.0)
MCV: 94 fl (ref 78.0–100.0)
Monocytes Absolute: 0.7 10*3/uL (ref 0.1–1.0)
Monocytes Relative: 5.8 % (ref 3.0–12.0)
Neutro Abs: 9.9 10*3/uL — ABNORMAL HIGH (ref 1.4–7.7)
Neutrophils Relative %: 77 % (ref 43.0–77.0)
Platelets: 237 10*3/uL (ref 150.0–400.0)
RBC: 4.63 Mil/uL (ref 4.22–5.81)
RDW: 14 % (ref 11.5–15.5)
WBC: 12.9 10*3/uL — ABNORMAL HIGH (ref 4.0–10.5)

## 2021-02-17 LAB — HEPATIC FUNCTION PANEL
ALT: 125 U/L — ABNORMAL HIGH (ref 0–53)
AST: 37 U/L (ref 0–37)
Albumin: 3.9 g/dL (ref 3.5–5.2)
Alkaline Phosphatase: 145 U/L — ABNORMAL HIGH (ref 39–117)
Bilirubin, Direct: 0.1 mg/dL (ref 0.0–0.3)
Total Bilirubin: 0.4 mg/dL (ref 0.2–1.2)
Total Protein: 7.1 g/dL (ref 6.0–8.3)

## 2021-02-17 LAB — BASIC METABOLIC PANEL
BUN: 18 mg/dL (ref 6–23)
CO2: 30 mEq/L (ref 19–32)
Calcium: 8.8 mg/dL (ref 8.4–10.5)
Chloride: 102 mEq/L (ref 96–112)
Creatinine, Ser: 0.92 mg/dL (ref 0.40–1.50)
GFR: 96.2 mL/min (ref >60.00)
Glucose, Bld: 155 mg/dL — ABNORMAL HIGH (ref 70–99)
Potassium: 3.8 mEq/L (ref 3.5–5.1)
Sodium: 138 mEq/L (ref 135–145)

## 2021-02-17 LAB — LIPASE: Lipase: 61 U/L — ABNORMAL HIGH (ref 11.0–59.0)

## 2021-02-17 NOTE — Progress Notes (Signed)
Patient ID: Gregory Owens, male   DOB: April 21, 1970, 51 y.o.   MRN: [id number]        Chief Complaint: follow up recent elevated LFTs and lipase       HPI:  Gregory Owens is a 51 y.o. male here after recent labs per endo with markedly elevated AST/ALT > 500, and multiple meds stopped including clomiphene (? The main issue) but also ozempic, crestor, metformin stopped; f/u LFTs per NP later with improved AST/ALT but also mild increased lipase 97 on dec 29, now for CT abd/pelvis scheduled jan 25;  has had some GI upset symptoms but none today - Denies worsening reflux, abd pain, dysphagia, n/v, bowel change or blood.  Pt hoping for f/u LFTs/lipase today.  Also had recent sleep apnea testing per Dr young, and found to have mild OSA with evidence for PLMD as well, plans to f/u with him regarding recommendations if any for tx.  Pt denies chest pain, increased sob or doe, wheezing, orthopnea, PND, increased LE swelling, palpitations, dizziness or syncope.   Pt denies polydipsia, polyuria, or low sugar symptoms.   Pt denies fever, wt loss, night sweats, loss of appetite, or other constitutional symptoms    Denies worsening depressive symptoms, suicidal ideation, or panic; has ongoing anxiety       Wt Readings from Last 3 Encounters:  02/17/21 254 lb (115.2 kg)  02/02/21 250 lb (113.4 kg)  01/26/21 250 lb (113.4 kg)   BP Readings from Last 3 Encounters:  02/17/21 118/70  01/26/21 126/74  01/05/21 112/70         Past Medical History:  Diagnosis Date   Abnormal EKG    NS ST-T EKG changes with negative Stress cardiolite 2003   Anxiety    Dehydration 2005   [place] , [place]   Diabetes mellitus without complication ([place])    Hyperglycemia    Hyperlipidemia    IBS (irritable bowel syndrome)    OSA (obstructive sleep apnea)    resolved post surgery   Past Surgical History:  Procedure Laterality Date   CHOLECYSTECTOMY N/A 06/07/2019   Procedure: LAPAROSCOPIC CHOLECYSTECTOMY;  Surgeon:  Ileana Roup, MD;  Location: [facility];  Service: General;  Laterality: N/A;   TONSILLECTOMY AND ADENOIDECTOMY      with above   UVULOPALATOPHARYNGOPLASTY  2004   Post op bleeding complication;  Dr Clotilde Dieter TOOTH EXTRACTION      reports that he has never smoked. He has never used smokeless tobacco. He reports that he does not drink alcohol and does not use drugs. family history includes Cirrhosis in his brother; Colon cancer in his paternal grandfather; Diabetes in his maternal grandmother and paternal grandmother; Heart attack (age of onset: 66) in his maternal grandmother and paternal grandmother; Hyperlipidemia in his father and mother; Hypertension in his father and mother; Other in his mother. Allergies  Allergen Reactions   Clomiphene Citrate     Other reaction(s): LFT elevation of  AST/ALT into 500's   Sulfa Antibiotics Other (See Comments)    Patient is not sure what reaction.    Current Outpatient Medications on File Prior to Visit  Medication Sig Dispense Refill   aspirin 81 MG EC tablet TAKE 1 TABLET (81 MG TOTAL) BY MOUTH DAILY. 90 tablet 3   Blood Glucose Monitoring Suppl (BAYER CONTOUR NEXT MONITOR) w/Device KIT Use as directed to check blood sugars daily Dx e11.9 1 kit 0   cetirizine (ZYRTEC) 10 MG tablet Take 10 mg  by mouth daily.     Continuous Blood Gluc Receiver (FREESTYLE LIBRE READER) DEVI Use as directed daily continuos E11.9 1 each 0   Continuous Blood Gluc Sensor (FREESTYLE LIBRE SENSOR SYSTEM) MISC Use as directed continous daily every 14 days E11.9 6 each 3   cyclobenzaprine (FLEXERIL) 5 MG tablet Take 1 tablet (5 mg total) by mouth 3 (three) times daily as needed for muscle spasms. 30 tablet 1   famotidine (PEPCID) 20 MG tablet Take 1 tablet (20 mg total) by mouth at bedtime. 90 tablet 3   fluticasone (FLONASE) 50 MCG/ACT nasal spray Place 2 sprays into both nostrils daily. 16 g 6   glucose blood test strip Use as instructed twice a day E 11.9 Contour  Next Strips 100 each 2   ketoconazole (NIZORAL) 2 % shampoo Apply 1 application topically 2 (two) times a week. 120 mL 6   losartan (COZAAR) 25 MG tablet TAKE 1 TABLET BY MOUTH  DAILY 90 tablet 3   metFORMIN (GLUCOPHAGE-XR) 500 MG 24 hr tablet TAKE 4 TABLETS BY MOUTH  ONCE DAILY WITH BREAKFAST 360 tablet 3   OZEMPIC, 0.25 OR 0.5 MG/DOSE, 2 MG/1.5ML SOPN Inject 0.5 mg into the skin once a week.     pantoprazole (PROTONIX) 40 MG tablet TAKE 1 TABLET BY MOUTH  DAILY 90 tablet 3   Probiotic Product (ALIGN PO) Take 1 capsule by mouth daily.      rosuvastatin (CRESTOR) 10 MG tablet TAKE 1 TABLET BY MOUTH  DAILY 90 tablet 3   solifenacin (VESICARE) 5 MG tablet TAKE 1 TABLET BY MOUTH  DAILY 90 tablet 3   venlafaxine XR (EFFEXOR XR) 75 MG 24 hr capsule Take 3 capsules (225 mg total) by mouth daily with breakfast. 270 capsule 3   Current Facility-Administered Medications on File Prior to Visit  Medication Dose Route Frequency Provider Last Rate Last Admin   testosterone cypionate (DEPOTESTOSTERONE CYPIONATE) injection 200 mg  200 mg Intramuscular Q14 Days Biagio Borg, MD   200 mg at 05/20/19 1600        ROS:  All others reviewed and negative.  Objective        PE:  BP 118/70 (BP Location: Right Arm, Patient Position: Sitting, Cuff Size: Large)    Pulse (!) 112    Temp 98.9 F (37.2 C) (Oral)    Ht 5' 8" (1.727 m)    Wt 254 lb (115.2 kg)    SpO2 96%    BMI 38.62 kg/m                 Constitutional: Pt appears in NAD               HENT: Head: NCAT.                Right Ear: External ear normal.                 Left Ear: External ear normal.                Eyes: . Pupils are equal, round, and reactive to light. Conjunctivae and EOM are normal               Nose: without d/c or deformity               Neck: Neck supple. Gross normal ROM               Cardiovascular: Normal rate and regular rhythm.  Pulmonary/Chest: Effort normal and breath sounds without rales or wheezing.                 Abd:  Soft, NT, ND, + BS, no organomegaly               Neurological: Pt is alert. At baseline orientation, motor grossly intact               Skin: Skin is warm. No rashes, no other new lesions, LE edema - none               Psychiatric: Pt behavior is normal without agitation , mild nervous  Micro: none  Cardiac tracings I have personally interpreted today:  none  Pertinent Radiological findings (summarize): none   Lab Results  Component Value Date   WBC 12.9 (H) 02/17/2021   HGB 13.8 02/17/2021   HCT 43.5 02/17/2021   PLT 237.0 02/17/2021   GLUCOSE 155 (H) 02/17/2021   CHOL 112 12/15/2019   TRIG 130.0 12/15/2019   HDL 36.90 (L) 12/15/2019   LDLDIRECT 53.0 08/29/2017   LDLCALC 50 12/15/2019   ALT 125 (H) 02/17/2021   AST 37 02/17/2021   NA 138 02/17/2021   K 3.8 02/17/2021   CL 102 02/17/2021   CREATININE 0.92 02/17/2021   BUN 18 02/17/2021   CO2 30 02/17/2021   TSH 1.64 12/15/2019   PSA 0.64 12/15/2019   HGBA1C 6.7 (H) 12/15/2019   MICROALBUR 2.0 (H) 12/15/2019   Assessment/Plan:  Gregory Owens is a 51 y.o. Black or African American [2] male with  has a past medical history of Abnormal EKG, Anxiety, Dehydration (2005), Diabetes mellitus without complication ([place]), Hyperglycemia, Hyperlipidemia, IBS (irritable bowel syndrome), and OSA (obstructive sleep apnea).  Abnormal LFTs Improved after multiple meds stopped but ? Due to clomiphene, for f/u lfts today  Abnormal serum lipase level asympt today, for f/u lipase, also has f/u CT abd for jan 25  Vitamin D deficiency Last vitamin D Lab Results  Component Value Date   VD25OH 26.09 (L) 12/15/2019   Low, reminded to start oral replacement   Obstructive sleep apnea Mild now s/p uvulopalatoplasty after prior exam severe in 2004; cont f/u with Dr Annamaria Boots, may need tx for PLMD as well such as gabapentin or other  Diabetes [facility]) Lab Results  Component Value Date   HGBA1C 6.7 (H) 12/15/2019    Stable, pt to continue current medical treatment  - DM diet and f/u endo as planned  Followup: Return if symptoms worsen or fail to improve.  Cathlean Cower, MD 02/18/2021 6:54 AM [facility]

## 2021-02-17 NOTE — Patient Instructions (Signed)
Please continue all other medications as before, and refills have been done if requested.  Please have the pharmacy call with any other refills you may need.  Please continue your efforts at being more active, low cholesterol diet, and weight control.  Please keep your appointments with your specialists as you may have planned  Please be sure to follow up with Dr Maple Hudson regarding the abormal sleep testing and PLMD  Please go to the LAB at the blood drawing area for the tests to be done - at the [facility] labs  You will be contacted by phone if any changes need to be made immediately.  Otherwise, you will receive a letter about your results with an explanation, but please check with MyChart first.  Please remember to sign up for MyChart if you have not done so, as this will be important to you in the future with finding out test results, communicating by private email, and scheduling acute appointments online when needed.

## 2021-02-18 ENCOUNTER — Encounter: Payer: Self-pay | Admitting: Internal Medicine

## 2021-02-18 NOTE — Assessment & Plan Note (Signed)
Lab Results  Component Value Date   HGBA1C 6.7 (H) 12/15/2019   Stable, pt to continue current medical treatment  - DM diet and f/u endo as planned

## 2021-02-18 NOTE — Assessment & Plan Note (Signed)
Mild now s/p uvulopalatoplasty after prior exam severe in 2004; cont f/u with Dr Maple Hudson, may need tx for PLMD as well such as gabapentin or other

## 2021-02-18 NOTE — Assessment & Plan Note (Signed)
asympt today, for f/u lipase, also has f/u CT abd for jan 25

## 2021-02-18 NOTE — Assessment & Plan Note (Signed)
Last vitamin D Lab Results  Component Value Date   VD25OH 26.09 (L) 12/15/2019   Low, reminded to start oral replacement

## 2021-02-18 NOTE — Assessment & Plan Note (Signed)
Improved after multiple meds stopped but ? Due to clomiphene, for f/u lfts today

## 2021-02-22 ENCOUNTER — Other Ambulatory Visit: Payer: Self-pay

## 2021-02-22 ENCOUNTER — Ambulatory Visit
Admission: RE | Admit: 2021-02-22 | Discharge: 2021-02-22 | Disposition: A | Discharge status: Home / Self Care | Payer: 59 | Source: Ambulatory Visit | Attending: Gastroenterology | Admitting: Gastroenterology

## 2021-02-22 DIAGNOSIS — R748 Abnormal levels of other serum enzymes: Secondary | ICD-10-CM

## 2021-02-22 MED ORDER — IOPAMIDOL (ISOVUE-300) INJECTION 61%
125.0000 mL | Freq: Once | INTRAVENOUS | Status: AC | PRN
  Administered 2021-02-22: 125 mL via INTRAVENOUS

## 2021-03-17 ENCOUNTER — Other Ambulatory Visit: Payer: 59

## 2021-03-17 ENCOUNTER — Encounter: Payer: Self-pay | Admitting: Internal Medicine

## 2021-03-17 DIAGNOSIS — R7989 Other specified abnormal findings of blood chemistry: Secondary | ICD-10-CM

## 2021-03-17 DIAGNOSIS — R748 Abnormal levels of other serum enzymes: Secondary | ICD-10-CM

## 2021-03-17 NOTE — Telephone Encounter (Signed)
Patient is requesting labs . Concern about his liver and pancreas levels. Please advise

## 2021-03-17 NOTE — Addendum Note (Signed)
Addended by: Susy Manor on: 0000000 05:06 PM   Modules accepted: Orders

## 2021-03-18 LAB — BASIC METABOLIC PANEL
BUN: 15 mg/dL (ref 7–25)
CO2: 25 mmol/L (ref 20–32)
Calcium: 9.1 mg/dL (ref 8.6–10.3)
Chloride: 102 mmol/L (ref 98–110)
Creat: 1.06 mg/dL (ref 0.70–1.30)
Glucose, Bld: 111 mg/dL — ABNORMAL HIGH (ref 65–99)
Potassium: 3.9 mmol/L (ref 3.5–5.3)
Sodium: 141 mmol/L (ref 135–146)

## 2021-03-18 LAB — LIPASE: Lipase: 47 U/L (ref 7–60)

## 2021-03-18 LAB — CBC WITH DIFFERENTIAL/PLATELET
Absolute Monocytes: 778 cells/uL (ref 200–950)
Basophils Absolute: 40 cells/uL (ref 0–200)
Basophils Relative: 0.4 %
Eosinophils Absolute: 253 cells/uL (ref 15–500)
Eosinophils Relative: 2.5 %
HCT: 45.6 % (ref 38.5–50.0)
Hemoglobin: 15 g/dL (ref 13.2–17.1)
Lymphs Abs: 3081 cells/uL (ref 850–3900)
MCH: 30.2 pg (ref 27.0–33.0)
MCHC: 32.9 g/dL (ref 32.0–36.0)
MCV: 91.9 fL (ref 80.0–100.0)
MPV: 10.2 fL (ref 7.5–12.5)
Monocytes Relative: 7.7 %
Neutro Abs: 5949 cells/uL (ref 1500–7800)
Neutrophils Relative %: 58.9 %
Platelets: 243 10*3/uL (ref 140–400)
RBC: 4.96 10*6/uL (ref 4.20–5.80)
RDW: 11.8 % (ref 11.0–15.0)
Total Lymphocyte: 30.5 %
WBC: 10.1 10*3/uL (ref 3.8–10.8)

## 2021-03-18 LAB — HEPATIC FUNCTION PANEL
AG Ratio: 1.3 (calc) (ref 1.0–2.5)
ALT: 42 U/L (ref 9–46)
AST: 17 U/L (ref 10–35)
Albumin: 4 g/dL (ref 3.6–5.1)
Alkaline phosphatase (APISO): 88 U/L (ref 35–144)
Bilirubin, Direct: 0.1 mg/dL (ref 0.0–0.2)
Globulin: 3.1 g/dL (calc) (ref 1.9–3.7)
Indirect Bilirubin: 0.2 mg/dL (calc) (ref 0.2–1.2)
Total Bilirubin: 0.3 mg/dL (ref 0.2–1.2)
Total Protein: 7.1 g/dL (ref 6.1–8.1)

## 2021-05-03 ENCOUNTER — Encounter: Payer: Self-pay | Admitting: Internal Medicine

## 2021-05-03 ENCOUNTER — Ambulatory Visit (INDEPENDENT_AMBULATORY_CARE_PROVIDER_SITE_OTHER): Payer: 59 | Admitting: Internal Medicine

## 2021-05-03 VITALS — BP 118/70 | HR 92 | Resp 18 | Ht 68.0 in | Wt 261.8 lb

## 2021-05-03 DIAGNOSIS — E7800 Pure hypercholesterolemia, unspecified: Secondary | ICD-10-CM | POA: Diagnosis not present

## 2021-05-03 DIAGNOSIS — E559 Vitamin D deficiency, unspecified: Secondary | ICD-10-CM | POA: Diagnosis not present

## 2021-05-03 DIAGNOSIS — E291 Testicular hypofunction: Secondary | ICD-10-CM

## 2021-05-03 DIAGNOSIS — Z0001 Encounter for general adult medical examination with abnormal findings: Secondary | ICD-10-CM | POA: Diagnosis not present

## 2021-05-03 DIAGNOSIS — Z23 Encounter for immunization: Secondary | ICD-10-CM | POA: Diagnosis not present

## 2021-05-03 DIAGNOSIS — E1165 Type 2 diabetes mellitus with hyperglycemia: Secondary | ICD-10-CM

## 2021-05-03 DIAGNOSIS — E538 Deficiency of other specified B group vitamins: Secondary | ICD-10-CM | POA: Diagnosis not present

## 2021-05-03 NOTE — Progress Notes (Signed)
Patient ID: Gregory Owens, male   DOB: 01-22-1971, 51 y.o.   MRN: [id number] ? ? ? ?     Chief Complaint:: wellness exam and Follow-up (He would like to have his liver enzymes checked. ) ? And DM, hypogonadism, hld, low vit d ? ?     HPI:  Gregory Owens is a 51 y.o. male here for wellness exam; due for shingles shot #2; declines covid booster, o/w up to date ?         ?              Also Pt denies chest pain, increased sob or doe, wheezing, orthopnea, PND, increased LE swelling, palpitations, dizziness or syncope.   Pt denies polydipsia, polyuria, or new focal neuro s/s.    Pt denies fever, wt loss, night sweats, loss of appetite, or other constitutional symptoms  Not taking Vit D  Denies worsening depressive symptoms, suicidal ideation, or panic; has ongoing anxiety ?  ?Wt Readings from Last 3 Encounters:  ?05/03/21 261 lb 12.8 oz (118.8 kg)  ?02/17/21 254 lb (115.2 kg)  ?02/02/21 250 lb (113.4 kg)  ? ?BP Readings from Last 3 Encounters:  ?05/03/21 118/70  ?02/17/21 118/70  ?01/26/21 126/74  ? ?Immunization History  ?Administered Date(s) Administered  ? Influenza Split 12/12/2010  ? Influenza Whole 11/11/2009  ? Influenza, Seasonal, Injecte, Preservative Fre 12/25/2011  ? Influenza,inj,Quad PF,6+ Mos 10/21/2012, 11/04/2013, 03/10/2015, 10/10/2016, 10/16/2017, 11/12/2018  ? Influenza-Unspecified 11/30/2015, 11/24/2019, 10/27/2020  ? PFIZER(Purple Top)SARS-COV-2 Vaccination 02/13/2019, 03/10/2019, 10/27/2020  ? Pneumococcal Polysaccharide-23 03/10/2015  ? Tdap 08/19/2012  ? Zoster Recombinat (Shingrix) 12/15/2020, 05/03/2021  ? ?There are no preventive care reminders to display for this patient. ? ?  ? ?Past Medical History:  ?Diagnosis Date  ? Abnormal EKG   ? NS ST-T EKG changes with negative Stress cardiolite 2003  ? Anxiety   ? Dehydration 2005  ? [place] , [place]  ? Diabetes mellitus without complication ([place])   ? Hyperglycemia   ? Hyperlipidemia   ? IBS (irritable bowel syndrome)   ? OSA  (obstructive sleep apnea)   ? resolved post surgery  ? ?Past Surgical History:  ?Procedure Laterality Date  ? CHOLECYSTECTOMY N/A 06/07/2019  ? Procedure: LAPAROSCOPIC CHOLECYSTECTOMY;  Surgeon: Ileana Roup, MD;  Location: [place];  Service: General;  Laterality: N/A;  ? TONSILLECTOMY AND ADENOIDECTOMY    ?  with above  ? UVULOPALATOPHARYNGOPLASTY  2004  ? Post op bleeding complication;  Dr Janace Hoard  ? WISDOM TOOTH EXTRACTION    ? ? reports that he has never smoked. He has never used smokeless tobacco. He reports that he does not drink alcohol and does not use drugs. ?family history includes Cirrhosis in his brother; Colon cancer in his paternal grandfather; Diabetes in his maternal grandmother and paternal grandmother; Heart attack (age of onset: 32) in his maternal grandmother and paternal grandmother; Hyperlipidemia in his father and mother; Hypertension in his father and mother; Other in his mother. ?Allergies  ?Allergen Reactions  ? Clomiphene Citrate   ?  Other reaction(s): LFT elevation of  AST/ALT into 500's  ? Sulfa Antibiotics Other (See Comments)  ?  Patient is not sure what reaction.   ? ?Current Outpatient Medications on File Prior to Visit  ?Medication Sig Dispense Refill  ? aspirin 81 MG EC tablet TAKE 1 TABLET (81 MG TOTAL) BY MOUTH DAILY. 90 tablet 3  ? Blood Glucose Monitoring Suppl (BAYER CONTOUR NEXT MONITOR) w/Device KIT Use as directed  to check blood sugars daily ?Dx e11.9 1 kit 0  ? cetirizine (ZYRTEC) 10 MG tablet Take 10 mg by mouth daily.    ? Continuous Blood Gluc Receiver (FREESTYLE LIBRE READER) DEVI Use as directed daily continuos E11.9 1 each 0  ? Continuous Blood Gluc Sensor (FREESTYLE LIBRE SENSOR SYSTEM) MISC Use as directed continous daily every 14 days E11.9 6 each 3  ? cyclobenzaprine (FLEXERIL) 5 MG tablet Take 1 tablet (5 mg total) by mouth 3 (three) times daily as needed for muscle spasms. 30 tablet 1  ? famotidine (PEPCID) 20 MG tablet Take 1 tablet (20 mg total) by mouth  at bedtime. 90 tablet 3  ? fluticasone (FLONASE) 50 MCG/ACT nasal spray Place 2 sprays into both nostrils daily. 16 g 6  ? glucose blood test strip Use as instructed twice a day E 11.9 Contour Next Strips 100 each 2  ? ketoconazole (NIZORAL) 2 % shampoo Apply 1 application topically 2 (two) times a week. 120 mL 6  ? losartan (COZAAR) 25 MG tablet TAKE 1 TABLET BY MOUTH  DAILY 90 tablet 3  ? metFORMIN (GLUCOPHAGE-XR) 500 MG 24 hr tablet TAKE 4 TABLETS BY MOUTH  ONCE DAILY WITH BREAKFAST 360 tablet 3  ? pantoprazole (PROTONIX) 40 MG tablet TAKE 1 TABLET BY MOUTH  DAILY 90 tablet 3  ? Probiotic Product (ALIGN PO) Take 1 capsule by mouth daily.     ? rosuvastatin (CRESTOR) 10 MG tablet TAKE 1 TABLET BY MOUTH  DAILY 90 tablet 3  ? solifenacin (VESICARE) 5 MG tablet TAKE 1 TABLET BY MOUTH  DAILY 90 tablet 3  ? venlafaxine XR (EFFEXOR XR) 75 MG 24 hr capsule Take 3 capsules (225 mg total) by mouth daily with breakfast. 270 capsule 3  ? ?Current Facility-Administered Medications on File Prior to Visit  ?Medication Dose Route Frequency Provider Last Rate Last Admin  ? testosterone cypionate (DEPOTESTOSTERONE CYPIONATE) injection 200 mg  200 mg Intramuscular Q14 Days Biagio Borg, MD   200 mg at 05/20/19 1600  ? ?     ROS:  All others reviewed and negative. ? ?Objective  ? ?     PE:  BP 118/70   Pulse 92   Resp 18   Ht _0  (1.727 m)   Wt 261 lb 12.8 oz (118.8 kg)   SpO2 97%   BMI 39.81 kg/m?  ? ?              Constitutional: Pt appears in NAD ?              HENT: Head: NCAT.  ?              Right Ear: External ear normal.   ?              Left Ear: External ear normal.  ?              Eyes: . Pupils are equal, round, and reactive to light. Conjunctivae and EOM are normal ?              Nose: without d/c or deformity ?              Neck: Neck supple. Gross normal ROM ?              Cardiovascular: Normal rate and regular rhythm.   ?              Pulmonary/Chest: Effort normal and breath sounds without rales  or  wheezing.  ?              Abd:  Soft, NT, ND, + BS, no organomegaly ?              Neurological: Pt is alert. At baseline orientation, motor grossly intact ?              Skin: Skin is warm. No rashes, no other new lesions, LE edema - none ?              Psychiatric: Pt behavior is normal without agitation  ? ?Micro: none ? ?Cardiac tracings I have personally interpreted today:  none ? ?Pertinent Radiological findings (summarize): none  ? ?Lab Results  ?Component Value Date  ? WBC 8.5 05/03/2021  ? HGB 14.0 05/03/2021  ? HCT 42.4 05/03/2021  ? PLT 277.0 05/03/2021  ? GLUCOSE 157 (H) 05/03/2021  ? CHOL 131 05/03/2021  ? TRIG 142.0 05/03/2021  ? HDL 38.20 (L) 05/03/2021  ? LDLDIRECT 53.0 08/29/2017  ? [place] 64 05/03/2021  ? ALT 104 (H) 05/03/2021  ? AST 23 05/03/2021  ? NA 143 05/03/2021  ? K 4.2 05/03/2021  ? CL 103 05/03/2021  ? CREATININE 1.08 05/03/2021  ? BUN 13 05/03/2021  ? CO2 29 05/03/2021  ? TSH 1.68 05/03/2021  ? PSA 0.54 05/03/2021  ? HGBA1C 7.7 (H) 05/03/2021  ? MICROALBUR 0.8 05/04/2021  ? ?Assessment/Plan:  ?Gregory Owens is a 51 y.o. Black or African American [2] male with  has a past medical history of Abnormal EKG, Anxiety, Dehydration (2005), Diabetes mellitus without complication ([place]), Hyperglycemia, Hyperlipidemia, IBS (irritable bowel syndrome), and OSA (obstructive sleep apnea). ? ?Vitamin D deficiency ?Last vitamin D ?Lab Results  ?Component Value Date  ? VD25OH 26.09 (L) 12/15/2019  ? ?Low, to start oral replacement ? ? ?Encounter for well adult exam with abnormal findings ?Age and sex appropriate education and counseling updated with regular exercise and diet ?Referrals for preventative services - none needed ?Immunizations addressed - shingles shot #2 today, declines covid booster ?Smoking counseling  - none needed ?Evidence for depression or other mood disorder - chronic anxiety no change ?Most recent labs reviewed. ?I have personally reviewed and have noted: ?1) the patient's  medical and social history ?2) The patient's current medications and supplements ?3) The patient's height, weight, and BMI have been recorded in the chart ? ? ?Diabetes ([place]) ?Lab Results  ?Component Value Date  ? HGBA1

## 2021-05-03 NOTE — Assessment & Plan Note (Signed)
Last vitamin D Lab Results  Component Value Date   VD25OH 26.09 (L) 12/15/2019   Low, to start oral replacement  

## 2021-05-03 NOTE — Patient Instructions (Signed)
You had the Shingles shot #2 today ? ?Please continue all other medications as before, and refills have been done if requested. ? ?Please have the pharmacy call with any other refills you may need. ? ?Please continue your efforts at being more active, low cholesterol diet, and weight control. ? ?You are otherwise up to date with prevention measures today. ? ?Please keep your appointments with your specialists as you may have planned ? ?Please go to the LAB at the blood drawing area for the tests to be done ? ?You will be contacted by phone if any changes need to be made immediately.  Otherwise, you will receive a letter about your results with an explanation, but please check with MyChart first. ? ?Please remember to sign up for MyChart if you have not done so, as this will be important to you in the future with finding out test results, communicating by private email, and scheduling acute appointments online when needed. ? ?Please make an Appointment to return in 6 months, or sooner if needed ?

## 2021-05-04 ENCOUNTER — Other Ambulatory Visit (INDEPENDENT_AMBULATORY_CARE_PROVIDER_SITE_OTHER): Payer: 59

## 2021-05-04 ENCOUNTER — Other Ambulatory Visit: Payer: Self-pay | Admitting: Internal Medicine

## 2021-05-04 DIAGNOSIS — E1165 Type 2 diabetes mellitus with hyperglycemia: Secondary | ICD-10-CM

## 2021-05-04 LAB — HEPATIC FUNCTION PANEL
ALT: 104 U/L — ABNORMAL HIGH (ref 0–53)
AST: 23 U/L (ref 0–37)
Albumin: 4 g/dL (ref 3.5–5.2)
Alkaline Phosphatase: 159 U/L — ABNORMAL HIGH (ref 39–117)
Bilirubin, Direct: 0.1 mg/dL (ref 0.0–0.3)
Total Bilirubin: 0.3 mg/dL (ref 0.2–1.2)
Total Protein: 6.9 g/dL (ref 6.0–8.3)

## 2021-05-04 LAB — CBC WITH DIFFERENTIAL/PLATELET
Basophils Absolute: 0.1 10*3/uL (ref 0.0–0.1)
Basophils Relative: 0.8 % (ref 0.0–3.0)
Eosinophils Absolute: 0.2 10*3/uL (ref 0.0–0.7)
Eosinophils Relative: 2.1 % (ref 0.0–5.0)
HCT: 42.4 % (ref 39.0–52.0)
Hemoglobin: 14 g/dL (ref 13.0–17.0)
Lymphocytes Relative: 30.1 % (ref 12.0–46.0)
Lymphs Abs: 2.6 10*3/uL (ref 0.7–4.0)
MCHC: 32.9 g/dL (ref 30.0–36.0)
MCV: 93 fl (ref 78.0–100.0)
Monocytes Absolute: 0.7 10*3/uL (ref 0.1–1.0)
Monocytes Relative: 8.2 % (ref 3.0–12.0)
Neutro Abs: 5 10*3/uL (ref 1.4–7.7)
Neutrophils Relative %: 58.8 % (ref 43.0–77.0)
Platelets: 277 10*3/uL (ref 150.0–400.0)
RBC: 4.56 Mil/uL (ref 4.22–5.81)
RDW: 13.3 % (ref 11.5–15.5)
WBC: 8.5 10*3/uL (ref 4.0–10.5)

## 2021-05-04 LAB — BASIC METABOLIC PANEL
BUN: 13 mg/dL (ref 6–23)
CO2: 29 mEq/L (ref 19–32)
Calcium: 9.2 mg/dL (ref 8.4–10.5)
Chloride: 103 mEq/L (ref 96–112)
Creatinine, Ser: 1.08 mg/dL (ref 0.40–1.50)
GFR: 79.25 mL/min (ref >60.00)
Glucose, Bld: 157 mg/dL — ABNORMAL HIGH (ref 70–99)
Potassium: 4.2 mEq/L (ref 3.5–5.1)
Sodium: 143 mEq/L (ref 135–145)

## 2021-05-04 LAB — URINALYSIS, ROUTINE W REFLEX MICROSCOPIC
Bilirubin Urine: NEGATIVE
Hgb urine dipstick: NEGATIVE
Ketones, ur: NEGATIVE
Leukocytes,Ua: NEGATIVE
Nitrite: NEGATIVE
Specific Gravity, Urine: 1.025 (ref 1.000–1.030)
Total Protein, Urine: NEGATIVE
Urine Glucose: 250 — AB
Urobilinogen, UA: 1 (ref 0.0–1.0)
pH: 6.5 (ref 5.0–8.0)

## 2021-05-04 LAB — LIPID PANEL
Cholesterol: 131 mg/dL (ref 0–200)
HDL: 38.2 mg/dL — ABNORMAL LOW (ref >39.00)
LDL Cholesterol: 64 mg/dL (ref 0–99)
NonHDL: 92.76
Total CHOL/HDL Ratio: 3
Triglycerides: 142 mg/dL (ref 0.0–149.0)
VLDL: 28.4 mg/dL (ref 0.0–40.0)

## 2021-05-04 LAB — MICROALBUMIN / CREATININE URINE RATIO
Creatinine,U: 217.3 mg/dL
Microalb Creat Ratio: 0.3 mg/g (ref 0.0–30.0)
Microalb, Ur: 0.8 mg/dL (ref 0.0–1.9)

## 2021-05-04 LAB — HEMOGLOBIN A1C: Hgb A1c MFr Bld: 7.7 % — ABNORMAL HIGH (ref 4.6–6.5)

## 2021-05-04 LAB — TESTOSTERONE: Testosterone: 251.04 ng/dL — ABNORMAL LOW (ref 300.00–890.00)

## 2021-05-04 LAB — TSH: TSH: 1.68 u[IU]/mL (ref 0.35–5.50)

## 2021-05-04 LAB — VITAMIN B12: Vitamin B-12: 353 pg/mL (ref 211–911)

## 2021-05-04 LAB — VITAMIN D 25 HYDROXY (VIT D DEFICIENCY, FRACTURES): VITD: 20.01 ng/mL — ABNORMAL LOW (ref 30.00–100.00)

## 2021-05-04 LAB — PSA: PSA: 0.54 ng/mL (ref 0.10–4.00)

## 2021-05-04 MED ORDER — OZEMPIC (0.25 OR 0.5 MG/DOSE) 2 MG/1.5ML ~~LOC~~ SOPN: 0.5000 mg | PEN_INJECTOR | SUBCUTANEOUS | 3 refills | Status: DC

## 2021-05-06 ENCOUNTER — Encounter: Payer: Self-pay | Admitting: Internal Medicine

## 2021-05-06 NOTE — Assessment & Plan Note (Signed)
Lab Results  ?Component Value Date  ? LDLCALC 64 05/03/2021  ? ?Stable, pt to continue current statin crestor 10 ? ?

## 2021-05-06 NOTE — Assessment & Plan Note (Signed)
Lab Results  ?Component Value Date  ? HGBA1C 7.7 (H) 05/03/2021  ? ?uncontrolled, pt to start ozempic. pt to continue current medical treatment metformin ? ?

## 2021-05-06 NOTE — Assessment & Plan Note (Signed)
Age and sex appropriate education and counseling updated with regular exercise and diet ?Referrals for preventative services - none needed ?Immunizations addressed - shingles shot #2 today, declines covid booster ?Smoking counseling  - none needed ?Evidence for depression or other mood disorder - chronic anxiety no change ?Most recent labs reviewed. ?I have personally reviewed and have noted: ?1) the patient's medical and social history ?2) The patient's current medications and supplements ?3) The patient's height, weight, and BMI have been recorded in the chart ? ?

## 2021-05-06 NOTE — Assessment & Plan Note (Signed)
Also for testosterone level °

## 2021-06-14 ENCOUNTER — Encounter: Payer: Self-pay | Admitting: Internal Medicine

## 2021-06-14 MED ORDER — VENLAFAXINE HCL ER 75 MG PO CP24: 225.0000 mg | ORAL_CAPSULE | Freq: Every day | ORAL | 3 refills | Status: DC

## 2021-08-02 ENCOUNTER — Ambulatory Visit (INDEPENDENT_AMBULATORY_CARE_PROVIDER_SITE_OTHER): Payer: No Typology Code available for payment source | Admitting: Internal Medicine

## 2021-08-02 ENCOUNTER — Other Ambulatory Visit (INDEPENDENT_AMBULATORY_CARE_PROVIDER_SITE_OTHER): Payer: 59

## 2021-08-02 ENCOUNTER — Encounter: Payer: Self-pay | Admitting: Internal Medicine

## 2021-08-02 VITALS — BP 112/78 | HR 107 | Temp 98.9°F | Ht 68.0 in | Wt 263.5 lb

## 2021-08-02 DIAGNOSIS — R1084 Generalized abdominal pain: Secondary | ICD-10-CM | POA: Diagnosis not present

## 2021-08-02 DIAGNOSIS — E1165 Type 2 diabetes mellitus with hyperglycemia: Secondary | ICD-10-CM | POA: Diagnosis not present

## 2021-08-02 DIAGNOSIS — E559 Vitamin D deficiency, unspecified: Secondary | ICD-10-CM

## 2021-08-02 DIAGNOSIS — K219 Gastro-esophageal reflux disease without esophagitis: Secondary | ICD-10-CM

## 2021-08-02 LAB — BASIC METABOLIC PANEL
BUN: 15 mg/dL (ref 6–23)
CO2: 29 mEq/L (ref 19–32)
Calcium: 9 mg/dL (ref 8.4–10.5)
Chloride: 101 mEq/L (ref 96–112)
Creatinine, Ser: 1.02 mg/dL (ref 0.40–1.50)
GFR: 84.73 mL/min (ref >60.00)
Glucose, Bld: 184 mg/dL — ABNORMAL HIGH (ref 70–99)
Potassium: 3.8 mEq/L (ref 3.5–5.1)
Sodium: 137 mEq/L (ref 135–145)

## 2021-08-02 LAB — HEPATIC FUNCTION PANEL
ALT: 39 U/L (ref 0–53)
AST: 15 U/L (ref 0–37)
Albumin: 4.1 g/dL (ref 3.5–5.2)
Alkaline Phosphatase: 116 U/L (ref 39–117)
Bilirubin, Direct: 0.1 mg/dL (ref 0.0–0.3)
Total Bilirubin: 0.3 mg/dL (ref 0.2–1.2)
Total Protein: 7.4 g/dL (ref 6.0–8.3)

## 2021-08-02 LAB — LIPASE: Lipase: 43 U/L (ref 11.0–59.0)

## 2021-08-02 MED ORDER — SUCRALFATE 1 G PO TABS: 1.0000 g | ORAL_TABLET | Freq: Three times a day (TID) | ORAL | 0 refills | Status: DC

## 2021-08-02 NOTE — Patient Instructions (Signed)
Please take all new medication as prescribed - the carafate for the stomach  Please continue all other medications as before, and refills have been done if requested.  Please have the pharmacy call with any other refills you may need.  Please keep your appointments with your specialists as you may have planned  You will be contacted regarding the referral for: Gastroenterology  Please go to the LAB at the blood drawing area for the tests to be done - at the [facility] lab  You will be contacted by phone if any changes need to be made immediately.  Otherwise, you will receive a letter about your results with an explanation, but please check with MyChart first.  Please remember to sign up for MyChart if you have not done so, as this will be important to you in the future with finding out test results, communicating by private email, and scheduling acute appointments online when needed.

## 2021-08-02 NOTE — Progress Notes (Signed)
Patient ID: Gregory Owens, male   DOB: 1970-02-11, 51 y.o.   MRN: [id number]        Chief Complaint: follow up abd pain        HPI:  Gregory Owens is a 51 y.o. male here with hx of elevated LFTs on clomid, gerd, anxiety and s/p CCX here with 1 wk worsening epigastric bubbling discomfort and mild bloating without minor nausea, no vomiting or fever, chills and Denies worsening reflux, other abd pain, dysphagia, other bowel change or blood.   Pt denies fever, wt loss, night sweats, loss of appetite, or other constitutional symptoms  Pt denies chest pain, increased sob or doe, wheezing, orthopnea, PND, increased LE swelling, palpitations, dizziness or syncope.  Pt denies polydipsia, polyuria, or new focal neuro s/s.  Denies worsening depressive symptoms, suicidal ideation, or panic; has ongoing anxiety.  Admits to only taking metformin 500 mg -- 2 in the PM for some reason and not taking ozempic.         Wt Readings from Last 3 Encounters:  08/02/21 263 lb 8 oz (119.5 kg)  05/03/21 261 lb 12.8 oz (118.8 kg)  02/17/21 254 lb (115.2 kg)   BP Readings from Last 3 Encounters:  08/02/21 112/78  05/03/21 118/70  02/17/21 118/70         Past Medical History:  Diagnosis Date   Abnormal EKG    NS ST-T EKG changes with negative Stress cardiolite 2003   Anxiety    Dehydration 2005   [place] , [place]   Diabetes mellitus without complication (HCC)    Hyperglycemia    Hyperlipidemia    IBS (irritable bowel syndrome)    OSA (obstructive sleep apnea)    resolved post surgery   Past Surgical History:  Procedure Laterality Date   CHOLECYSTECTOMY N/A 06/07/2019   Procedure: LAPAROSCOPIC CHOLECYSTECTOMY;  Surgeon: Andria Meuse, MD;  Location: [facility] OR;  Service: General;  Laterality: N/A;   TONSILLECTOMY AND ADENOIDECTOMY      with above   UVULOPALATOPHARYNGOPLASTY  2004   Post op bleeding complication;  Dr Marinda Elk TOOTH EXTRACTION      reports that he has never smoked.  He has never used smokeless tobacco. He reports that he does not drink alcohol and does not use drugs. family history includes Cirrhosis in his brother; Colon cancer in his paternal grandfather; Diabetes in his maternal grandmother and paternal grandmother; Heart attack (age of onset: 16) in his maternal grandmother and paternal grandmother; Hyperlipidemia in his father and mother; Hypertension in his father and mother; Other in his mother. Allergies  Allergen Reactions   Clomiphene Citrate     Other reaction(s): LFT elevation of  AST/ALT into 500's   Sulfa Antibiotics Other (See Comments)    Patient is not sure what reaction.    Current Outpatient Medications on File Prior to Visit  Medication Sig Dispense Refill   aspirin 81 MG EC tablet TAKE 1 TABLET (81 MG TOTAL) BY MOUTH DAILY. 90 tablet 3   cetirizine (ZYRTEC) 10 MG tablet Take 10 mg by mouth daily.     Continuous Blood Gluc Receiver (FREESTYLE LIBRE READER) DEVI Use as directed daily continuos E11.9 1 each 0   Continuous Blood Gluc Sensor (FREESTYLE LIBRE SENSOR SYSTEM) MISC Use as directed continous daily every 14 days E11.9 6 each 3   famotidine (PEPCID) 20 MG tablet Take 1 tablet (20 mg total) by mouth at bedtime. 90 tablet 3   fluticasone (FLONASE) 50  MCG/ACT nasal spray Place 2 sprays into both nostrils daily. 16 g 6   glucose blood test strip Use as instructed twice a day E 11.9 Contour Next Strips 100 each 2   ketoconazole (NIZORAL) 2 % shampoo Apply 1 application topically 2 (two) times a week. 120 mL 6   losartan (COZAAR) 25 MG tablet TAKE 1 TABLET BY MOUTH  DAILY 90 tablet 3   metFORMIN (GLUCOPHAGE-XR) 500 MG 24 hr tablet TAKE 4 TABLETS BY MOUTH  ONCE DAILY WITH BREAKFAST 360 tablet 3   pantoprazole (PROTONIX) 40 MG tablet TAKE 1 TABLET BY MOUTH  DAILY 90 tablet 3   Probiotic Product (ALIGN PO) Take 1 capsule by mouth daily.      rosuvastatin (CRESTOR) 10 MG tablet TAKE 1 TABLET BY MOUTH  DAILY 90 tablet 3   Semaglutide,0.25  or 0.5MG /DOS, (OZEMPIC, 0.25 OR 0.5 MG/DOSE,) 2 MG/1.5ML SOPN Inject 0.5 mg into the skin once a week. 3 mL 3   solifenacin (VESICARE) 5 MG tablet TAKE 1 TABLET BY MOUTH  DAILY 90 tablet 3   venlafaxine XR (EFFEXOR XR) 75 MG 24 hr capsule Take 3 capsules (225 mg total) by mouth daily with breakfast. 270 capsule 3   cyclobenzaprine (FLEXERIL) 5 MG tablet Take 1 tablet (5 mg total) by mouth 3 (three) times daily as needed for muscle spasms. (Patient not taking: Reported on 08/02/2021) 30 tablet 1   Current Facility-Administered Medications on File Prior to Visit  Medication Dose Route Frequency Provider Last Rate Last Admin   testosterone cypionate (DEPOTESTOSTERONE CYPIONATE) injection 200 mg  200 mg Intramuscular Q14 Days Corwin Levins, MD   200 mg at 05/20/19 1600        ROS:  All others reviewed and negative.  Objective        PE:  BP 112/78   Pulse (!) 107   Temp 98.9 F (37.2 C) (Oral)   Ht 5\' 8"  (1.727 m)   Wt 263 lb 8 oz (119.5 kg)   SpO2 97%   BMI 40.07 kg/m                 Constitutional: Pt appears in NAD               HENT: Head: NCAT.                Right Ear: External ear normal.                 Left Ear: External ear normal.                Eyes: . Pupils are equal, round, and reactive to light. Conjunctivae and EOM are normal               Nose: without d/c or deformity               Neck: Neck supple. Gross normal ROM               Cardiovascular: Normal rate and regular rhythm.                 Pulmonary/Chest: Effort normal and breath sounds without rales or wheezing.                Abd:  Soft, NT, ND, + BS, no organomegaly               Neurological: Pt is alert. At baseline orientation, motor grossly intact  Skin: Skin is warm. No rashes, no other new lesions, LE edema - none               Psychiatric: Pt behavior is normal without agitation ; 1+ nervous  Micro: none  Cardiac tracings I have personally interpreted today:  none  Pertinent  Radiological findings (summarize): none   Lab Results  Component Value Date   WBC 8.5 05/03/2021   HGB 14.0 05/03/2021   HCT 42.4 05/03/2021   PLT 277.0 05/03/2021   GLUCOSE 157 (H) 05/03/2021   CHOL 131 05/03/2021   TRIG 142.0 05/03/2021   HDL 38.20 (L) 05/03/2021   LDLDIRECT 53.0 08/29/2017   LDLCALC 64 05/03/2021   ALT 104 (H) 05/03/2021   AST 23 05/03/2021   NA 143 05/03/2021   K 4.2 05/03/2021   CL 103 05/03/2021   CREATININE 1.08 05/03/2021   BUN 13 05/03/2021   CO2 29 05/03/2021   TSH 1.68 05/03/2021   PSA 0.54 05/03/2021   HGBA1C 7.7 (H) 05/03/2021   MICROALBUR 0.8 05/04/2021   Assessment/Plan:  Tahjir Arren Laminack is a 51 y.o. Black or African American [2] male with  has a past medical history of Abnormal EKG, Anxiety, Dehydration (2005), Diabetes mellitus without complication (HCC), Hyperglycemia, Hyperlipidemia, IBS (irritable bowel syndrome), and OSA (obstructive sleep apnea).  Abdominal pain, generalized Etiology unclear, ? Gastritis vs other -for trial carafate 1 gm qid x 1 mo, labs as ordered incluidng cbc, lfts, and refer GI per pt reqeust  Diabetes (HCC) With some non compliance with med, for a1c with labs, continue metformin 500 mg - 2 in pm for now pending results  GERD (gastroesophageal reflux disease) Stable, continue protonix 40 mg qd  Vitamin D deficiency Last vitamin D Lab Results  Component Value Date   VD25OH 20.01 (L) 05/03/2021   Low, reminded to start oral replacement  Followup: Return if symptoms worsen or fail to improve.  Oliver Barre, MD 08/02/2021 6:51 PM [facility]

## 2021-08-02 NOTE — Assessment & Plan Note (Signed)
Last vitamin D Lab Results  Component Value Date   VD25OH 20.01 (L) 05/03/2021   Low, reminded to start oral replacement

## 2021-08-02 NOTE — Assessment & Plan Note (Signed)
Etiology unclear, ? Gastritis vs other -for trial carafate 1 gm qid x 1 mo, labs as ordered incluidng cbc, lfts, and refer GI per pt reqeust

## 2021-08-02 NOTE — Assessment & Plan Note (Signed)
Stable, continue protonix 40 mg qd 

## 2021-08-02 NOTE — Assessment & Plan Note (Signed)
With some non compliance with med, for a1c with labs, continue metformin 500 mg - 2 in pm for now pending results

## 2021-08-03 LAB — CBC WITH DIFFERENTIAL/PLATELET
Basophils Absolute: 0.1 10*3/uL (ref 0.0–0.1)
Basophils Relative: 0.8 % (ref 0.0–3.0)
Eosinophils Absolute: 0.2 10*3/uL (ref 0.0–0.7)
Eosinophils Relative: 1.6 % (ref 0.0–5.0)
HCT: 43 % (ref 39.0–52.0)
Hemoglobin: 14 g/dL (ref 13.0–17.0)
Lymphocytes Relative: 25.7 % (ref 12.0–46.0)
Lymphs Abs: 2.9 10*3/uL (ref 0.7–4.0)
MCHC: 32.6 g/dL (ref 30.0–36.0)
MCV: 94.2 fl (ref 78.0–100.0)
Monocytes Absolute: 0.9 10*3/uL (ref 0.1–1.0)
Monocytes Relative: 8.2 % (ref 3.0–12.0)
Neutro Abs: 7.3 10*3/uL (ref 1.4–7.7)
Neutrophils Relative %: 63.7 % (ref 43.0–77.0)
Platelets: 271 10*3/uL (ref 150.0–400.0)
RBC: 4.57 Mil/uL (ref 4.22–5.81)
RDW: 12.9 % (ref 11.5–15.5)
WBC: 11.5 10*3/uL — ABNORMAL HIGH (ref 4.0–10.5)

## 2021-08-03 LAB — URINALYSIS, ROUTINE W REFLEX MICROSCOPIC
Bilirubin Urine: NEGATIVE
Hgb urine dipstick: NEGATIVE
Leukocytes,Ua: NEGATIVE
Nitrite: NEGATIVE
RBC / HPF: NONE SEEN (ref 0–?)
Specific Gravity, Urine: >=1.03 — AB (ref 1.000–1.030)
Total Protein, Urine: NEGATIVE
Urine Glucose: 500 — AB
Urobilinogen, UA: 0.2 (ref 0.0–1.0)
WBC, UA: NONE SEEN (ref 0–?)
pH: 6 (ref 5.0–8.0)

## 2021-08-03 LAB — HEMOGLOBIN A1C: Hgb A1c MFr Bld: 7.9 % — ABNORMAL HIGH (ref 4.6–6.5)

## 2021-08-09 ENCOUNTER — Encounter: Payer: Self-pay | Admitting: Internal Medicine

## 2021-08-09 MED ORDER — VENLAFAXINE HCL ER 75 MG PO CP24: 225.0000 mg | ORAL_CAPSULE | Freq: Every day | ORAL | 3 refills | Status: DC

## 2021-09-04 NOTE — Progress Notes (Deleted)
09/04/2021 Gregory Owens Lennette Bihari [id number] [birth year]  Referring provider: Corwin Levins, MD Primary GI doctor: {acdocs:27040}  ASSESSMENT AND PLAN:   There are no diagnoses linked to this encounter.   Patient Care Team: Corwin Levins, MD as PCP - General (Internal Medicine)  HISTORY OF PRESENT ILLNESS: 51 y.o. male with a past medical history of OSA, type 2 diabetes, morbid obesity, anxiety, nonalcoholic steatohepatitis, status post cholecystectomy 2021, and others listed below presents for evaluation of abdominal pain.  02/22/2021 CT abdomen pelvis with contrast for epigastric and upper abdominal pain showed no acute findings, small local hernia containing fat 01/31/2021 abnormal liver function right upper quadrant status post cholecystectomy no dilatation of bile ducts.  Fatty liver  Has metformin and Ozempic listed. Appears to be on testosterone injections.  Current Medications:   Current Outpatient Medications (Endocrine & Metabolic):    metFORMIN (GLUCOPHAGE-XR) 500 MG 24 hr tablet, TAKE 4 TABLETS BY MOUTH  ONCE DAILY WITH BREAKFAST   Semaglutide,0.25 or 0.5MG /DOS, (OZEMPIC, 0.25 OR 0.5 MG/DOSE,) 2 MG/1.5ML SOPN, Inject 0.5 mg into the skin once a week.  Current Facility-Administered Medications (Endocrine & Metabolic):    testosterone cypionate (DEPOTESTOSTERONE CYPIONATE) injection 200 mg  Current Outpatient Medications (Cardiovascular):    losartan (COZAAR) 25 MG tablet, TAKE 1 TABLET BY MOUTH  DAILY   rosuvastatin (CRESTOR) 10 MG tablet, TAKE 1 TABLET BY MOUTH  DAILY   Current Outpatient Medications (Respiratory):    cetirizine (ZYRTEC) 10 MG tablet, Take 10 mg by mouth daily.   fluticasone (FLONASE) 50 MCG/ACT nasal spray, Place 2 sprays into both nostrils daily.   Current Outpatient Medications (Analgesics):    aspirin 81 MG EC tablet, TAKE 1 TABLET (81 MG TOTAL) BY MOUTH DAILY.     Current Outpatient Medications (Other):    Continuous Blood Gluc  Receiver (FREESTYLE LIBRE READER) DEVI, Use as directed daily continuos E11.9   Continuous Blood Gluc Sensor (FREESTYLE LIBRE SENSOR SYSTEM) MISC, Use as directed continous daily every 14 days E11.9   cyclobenzaprine (FLEXERIL) 5 MG tablet, Take 1 tablet (5 mg total) by mouth 3 (three) times daily as needed for muscle spasms. (Patient not taking: Reported on 08/02/2021)   famotidine (PEPCID) 20 MG tablet, Take 1 tablet (20 mg total) by mouth at bedtime.   glucose blood test strip, Use as instructed twice a day E 11.9 Contour Next Strips   ketoconazole (NIZORAL) 2 % shampoo, Apply 1 application topically 2 (two) times a week.   pantoprazole (PROTONIX) 40 MG tablet, TAKE 1 TABLET BY MOUTH  DAILY   Probiotic Product (ALIGN PO), Take 1 capsule by mouth daily.    solifenacin (VESICARE) 5 MG tablet, TAKE 1 TABLET BY MOUTH  DAILY   sucralfate (CARAFATE) 1 g tablet, Take 1 tablet (1 g total) by mouth 4 (four) times daily -  with meals and at bedtime.   venlafaxine XR (EFFEXOR XR) 75 MG 24 hr capsule, Take 3 capsules (225 mg total) by mouth daily with breakfast.   Medical History:  Past Medical History:  Diagnosis Date   Abnormal EKG    NS ST-T EKG changes with negative Stress cardiolite 2003   Anxiety    Dehydration 2005   [place] , [place]   Diabetes mellitus without complication (HCC)    Hyperglycemia    Hyperlipidemia    IBS (irritable bowel syndrome)    OSA (obstructive sleep apnea)    resolved post surgery   Allergies:  Allergies  Allergen Reactions  Clomiphene Citrate     Other reaction(s): LFT elevation of  AST/ALT into 500's   Sulfa Antibiotics Other (See Comments)    Patient is not sure what reaction.      Surgical History:  He  has a past surgical history that includes Uvulopalatopharyngoplasty (2004); Tonsillectomy and adenoidectomy; Wisdom tooth extraction; and Cholecystectomy (N/A, 06/07/2019). Family History:  His family history includes Cirrhosis in his brother; Colon cancer  in his paternal grandfather; Diabetes in his maternal grandmother and paternal grandmother; Heart attack (age of onset: 71) in his maternal grandmother and paternal grandmother; Hyperlipidemia in his father and mother; Hypertension in his father and mother; Other in his mother. Social History:   reports that he has never smoked. He has never used smokeless tobacco. He reports that he does not drink alcohol and does not use drugs.  REVIEW OF SYSTEMS  : All other systems reviewed and negative except where noted in the History of Present Illness.   PHYSICAL EXAM: There were no vitals taken for this visit. General:   Pleasant, well developed male in no acute distress Head:   Normocephalic and atraumatic. Eyes:  {sclerae:26738},conjunctive {conjuctiva:26739}  Heart:   {HEART EXAM HEM/ONC:21750} Pulm:  Clear anteriorly; no wheezing Abdomen:   {BlankSingle:19197::"Distended","Ridged","Soft"}, {BlankSingle:19197::"Flat","Obese","Non-distended"} AB, {BlankSingle:19197::"Absent","Hyperactive, tinkling","Hypoactive","Sluggish","Active"} bowel sounds. {actendernessAB:27319} tenderness {anatomy; site abdomen:5010}. {BlankMultiple:19196::"Without guarding","With guarding","Without rebound","With rebound"}, No organomegaly appreciated. Rectal: {acrectalexam:27461} Extremities:  {With/Without:304960234} edema. Msk: Symmetrical without gross deformities. Peripheral pulses intact.  Neurologic:  Alert and  oriented x4;  No focal deficits.  Skin:   Dry and intact without significant lesions or rashes. Psychiatric:  Cooperative. Normal mood and affect.    Doree Albee, PA-C 10:25 AM

## 2021-09-05 ENCOUNTER — Encounter (HOSPITAL_COMMUNITY): Payer: Self-pay

## 2021-09-05 ENCOUNTER — Ambulatory Visit: Payer: 59 | Admitting: Physician Assistant

## 2021-09-05 ENCOUNTER — Ambulatory Visit (HOSPITAL_COMMUNITY)
Admission: EM | Admit: 2021-09-05 | Discharge: 2021-09-05 | Disposition: A | Discharge status: Home / Self Care | Payer: No Typology Code available for payment source | Attending: Family Medicine | Admitting: Family Medicine

## 2021-09-05 DIAGNOSIS — R221 Localized swelling, mass and lump, neck: Secondary | ICD-10-CM | POA: Diagnosis present

## 2021-09-05 LAB — CBC WITH DIFFERENTIAL/PLATELET
Abs Immature Granulocytes: 0.04 10*3/uL (ref 0.00–0.07)
Basophils Absolute: 0 10*3/uL (ref 0.0–0.1)
Basophils Relative: 0 %
Eosinophils Absolute: 0.2 10*3/uL (ref 0.0–0.5)
Eosinophils Relative: 2 %
HCT: 43.2 % (ref 39.0–52.0)
Hemoglobin: 14.4 g/dL (ref 13.0–17.0)
Immature Granulocytes: 0 %
Lymphocytes Relative: 30 %
Lymphs Abs: 2.9 10*3/uL (ref 0.7–4.0)
MCH: 31.2 pg (ref 26.0–34.0)
MCHC: 33.3 g/dL (ref 30.0–36.0)
MCV: 93.7 fL (ref 80.0–100.0)
Monocytes Absolute: 0.6 10*3/uL (ref 0.1–1.0)
Monocytes Relative: 7 %
Neutro Abs: 5.8 10*3/uL (ref 1.7–7.7)
Neutrophils Relative %: 61 %
Platelets: 247 10*3/uL (ref 150–400)
RBC: 4.61 MIL/uL (ref 4.22–5.81)
RDW: 12.1 % (ref 11.5–15.5)
WBC: 9.5 10*3/uL (ref 4.0–10.5)
nRBC: 0 % (ref 0.0–0.2)

## 2021-09-05 LAB — TSH: TSH: 2.115 u[IU]/mL (ref 0.350–4.500)

## 2021-09-05 NOTE — ED Triage Notes (Addendum)
Neck pain onset of today. Pain in the bottom left side of the neck.   Patient states it feels like there is a lump on the left side. Patient does have seasonal allergies and diabetes.   No new foods, medications, or exercises. Patient recently had a baby and has been lifting them and moving more.  No known injuries or falls. No difficulty swallowing, no reduced range of motion.

## 2021-09-05 NOTE — Discharge Instructions (Addendum)
You have had labs (blood work) sent this evening. We will call you with any significant abnormalities or if there is need to begin or nge treatment or pursue further follow up.  You may also review your test results online through MyChart. If you do not have a MyChart account, instructions to sign up should be on your discharge paperwork.

## 2021-09-06 ENCOUNTER — Encounter: Payer: Self-pay | Admitting: Family

## 2021-09-06 ENCOUNTER — Ambulatory Visit (INDEPENDENT_AMBULATORY_CARE_PROVIDER_SITE_OTHER): Payer: No Typology Code available for payment source | Admitting: Family

## 2021-09-06 ENCOUNTER — Encounter: Payer: Self-pay | Admitting: Nurse Practitioner

## 2021-09-06 VITALS — BP 138/85 | HR 110 | Temp 97.7°F | Ht 68.0 in | Wt 264.2 lb

## 2021-09-06 DIAGNOSIS — R221 Localized swelling, mass and lump, neck: Secondary | ICD-10-CM | POA: Diagnosis not present

## 2021-09-06 NOTE — ED Provider Notes (Signed)
[name]   [id number] 09/05/21 Arrival Time: 1901  ASSESSMENT & PLAN:  1. Localized swelling, mass and lump, neck    Recommend he call his PCP to discuss U/S of anterior neck.  CBC and TSH normal. Reassuring. No swallowing/breathing difficulties.   Follow-up Information     Call  Corwin Levins, MD.   Specialties: Internal Medicine, Radiology Why: To discuss imaging of your neck. Contact information: [address] [place] [address] [phone]                Agrees to ED eval should he develop any swallowing/resp difficulties.  Reviewed expectations re: course of current medical issues. Questions answered. Outlined signs and symptoms indicating need for more acute intervention. Patient verbalized understanding. After Visit Summary given.  SUBJECTIVE: History from: patient. Gregory Owens is a 51 y.o. male who feels like there is swelling present over LEFT anterior neck; noted today. "Feels like a lump to me". No pain. Normal PO intake/swallowing. Normal breathing. Otherwise well. Afebrile. Weight stable.   OBJECTIVE:  Vitals:   09/05/21 1908 09/05/21 1911  BP: (!) 144/82   Pulse: (!) 108   Resp: 16   Temp: 97.7 F (36.5 C)   TempSrc: Oral   SpO2: 95%   Weight:  120.2 kg  Height:  5\' 8"  (1.727 m)    Recheck P: 94  General appearance: alert; no distress; obese HEENT: normocephalic; atraumatic; conjunctivae normal; PERRLA; EOMI Neck: supple with FROM; agree with him regarding slight swelling of LEFT lower anterior neck; slight firmness; no specific nodules appreciated; no overlying skin changes; no cervical LAD Lungs: clear to auscultation bilaterally; unlabored Heart: regular rate and rhythm Extremities: moves all extremities normally; no edema; symmetrical with no gross deformities Skin: warm and dry Neurologic: gait normal; normal sensation and strength of bilateral UE Psychological: alert and cooperative; normal mood  and affect  Results for orders placed or performed during the hospital encounter of 09/05/21  CBC with Differential/Platelet  Result Value Ref Range   WBC 9.5 4.0 - 10.5 K/uL   RBC 4.61 4.22 - 5.81 MIL/uL   Hemoglobin 14.4 13.0 - 17.0 g/dL   HCT 11/05/21 71.6 - 96.7 %   MCV 93.7 80.0 - 100.0 fL   MCH 31.2 26.0 - 34.0 pg   MCHC 33.3 30.0 - 36.0 g/dL   RDW 89.3 81.0 - 17.5 %   Platelets 247 150 - 400 K/uL   nRBC 0.0 0.0 - 0.2 %   Neutrophils Relative % 61 %   Neutro Abs 5.8 1.7 - 7.7 K/uL   Lymphocytes Relative 30 %   Lymphs Abs 2.9 0.7 - 4.0 K/uL   Monocytes Relative 7 %   Monocytes Absolute 0.6 0.1 - 1.0 K/uL   Eosinophils Relative 2 %   Eosinophils Absolute 0.2 0.0 - 0.5 K/uL   Basophils Relative 0 %   Basophils Absolute 0.0 0.0 - 0.1 K/uL   Immature Granulocytes 0 %   Abs Immature Granulocytes 0.04 0.00 - 0.07 K/uL  TSH  Result Value Ref Range   TSH 2.115 0.350 - 4.500 uIU/mL    Labs Reviewed  CBC WITH DIFFERENTIAL/PLATELET  TSH   Allergies  Allergen Reactions   Clomiphene Citrate     Other reaction(s): LFT elevation of  AST/ALT into 500's   Sulfa Antibiotics Other (See Comments)    Patient is not sure what reaction.    Past Medical History:  Diagnosis Date   Abnormal EKG  NS ST-T EKG changes with negative Stress cardiolite 2003   Anxiety    Dehydration 2005   [place] , [place]   Diabetes mellitus without complication (HCC)    Hyperglycemia    Hyperlipidemia    IBS (irritable bowel syndrome)    OSA (obstructive sleep apnea)    resolved post surgery   Past Surgical History:  Procedure Laterality Date   CHOLECYSTECTOMY N/A 06/07/2019   Procedure: LAPAROSCOPIC CHOLECYSTECTOMY;  Surgeon: Andria Meuse, MD;  Location: [facility] OR;  Service: General;  Laterality: N/A;   TONSILLECTOMY AND ADENOIDECTOMY      with above   UVULOPALATOPHARYNGOPLASTY  2004   Post op bleeding complication;  Dr Marinda Elk TOOTH EXTRACTION     Family History  Problem Relation Age  of Onset   Hyperlipidemia Mother    Hypertension Mother    Other Mother        Inflammatory Occipital Lymphadenitis   Hypertension Father    Hyperlipidemia Father    Diabetes Maternal Grandmother    Heart attack Maternal Grandmother 44   Diabetes Paternal Grandmother    Heart attack Paternal Grandmother 53   Cirrhosis Brother        autoimmune hepatitis (twin)   Colon cancer Paternal Grandfather    Social History   Socioeconomic History   Marital status: Married    Spouse name: Not on file   Number of children: Not on file   Years of education: Not on file   Highest education level: Not on file  Occupational History   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

## 2021-09-06 NOTE — Patient Instructions (Signed)
It was very nice to see you today!    I have sent an order for an ultrasound for your neck. The radiology office will call you directly to schedule.        PLEASE NOTE:  If you had any lab tests please let us know if you have not heard back within a few days. You may see your results on MyChart before we have a chance to review them but we will give you a call once they are reviewed by [place]. If we ordered any referrals today, please let us know if you have not heard from their office within the next week.

## 2021-09-06 NOTE — Progress Notes (Signed)
Patient ID: Gregory Owens, male    DOB: 1970-04-28, 51 y.o.   MRN: [id number]  Chief Complaint  Patient presents with   Neck Pain    Pt c/o noticed swelling in his neck on his left side. Pt states he also received labs but they did not go over labs with him. Noticed yesterday, No pain.     HPI: Left neck swelling:  pt reports he woke up yesterday and noticed the left side of his lower neck was more swollen, denies any pain, no discoloration, no dysphagia or irritated throat. Pt denies ever having problem before. Seen in UC, CBC & TSH both negative.   Assessment & Plan:  1. Localized swelling, mass and lump, neck extra tissue vs. actual mass palpable just above sternal notch. Pt has a large neck with a lot of skin folds and difficult to assess properly. Ordering U/S to rule out more serious etiology.  - US Soft Tissue Head/Neck (NON-THYROID); Future    Subjective:    Outpatient Medications Prior to Visit  Medication Sig Dispense Refill   cetirizine (ZYRTEC) 10 MG tablet Take 10 mg by mouth daily.     Continuous Blood Gluc Receiver (FREESTYLE LIBRE READER) DEVI Use as directed daily continuos E11.9 1 each 0   Continuous Blood Gluc Sensor (FREESTYLE LIBRE SENSOR SYSTEM) MISC Use as directed continous daily every 14 days E11.9 6 each 3   cyclobenzaprine (FLEXERIL) 5 MG tablet Take 1 tablet (5 mg total) by mouth 3 (three) times daily as needed for muscle spasms. 30 tablet 1   famotidine (PEPCID) 20 MG tablet Take 1 tablet (20 mg total) by mouth at bedtime. 90 tablet 3   fluticasone (FLONASE) 50 MCG/ACT nasal spray Place 2 sprays into both nostrils daily. 16 g 6   glucose blood test strip Use as instructed twice a day E 11.9 Contour Next Strips 100 each 2   ketoconazole (NIZORAL) 2 % shampoo Apply 1 application topically 2 (two) times a week. 120 mL 6   losartan (COZAAR) 25 MG tablet TAKE 1 TABLET BY MOUTH  DAILY 90 tablet 3   metFORMIN (GLUCOPHAGE-XR) 500 MG 24 hr tablet TAKE  4 TABLETS BY MOUTH  ONCE DAILY WITH BREAKFAST 360 tablet 3   pantoprazole (PROTONIX) 40 MG tablet TAKE 1 TABLET BY MOUTH  DAILY 90 tablet 3   Probiotic Product (ALIGN PO) Take 1 capsule by mouth daily.      rosuvastatin (CRESTOR) 10 MG tablet TAKE 1 TABLET BY MOUTH  DAILY 90 tablet 3   solifenacin (VESICARE) 5 MG tablet TAKE 1 TABLET BY MOUTH  DAILY 90 tablet 3   venlafaxine XR (EFFEXOR XR) 75 MG 24 hr capsule Take 3 capsules (225 mg total) by mouth daily with breakfast. 270 capsule 3   testosterone cypionate (DEPOTESTOSTERONE CYPIONATE) injection 200 mg      No facility-administered medications prior to visit.   Past Medical History:  Diagnosis Date   Abnormal EKG    NS ST-T EKG changes with negative Stress cardiolite 2003   Anxiety    Dehydration 2005   [place] , [place]   Diabetes mellitus without complication (HCC)    Hyperglycemia    Hyperlipidemia    IBS (irritable bowel syndrome)    OSA (obstructive sleep apnea)    resolved post surgery   Past Surgical History:  Procedure Laterality Date   CHOLECYSTECTOMY N/A 06/07/2019   Procedure: LAPAROSCOPIC CHOLECYSTECTOMY;  Surgeon: Andria Meuse, MD;  Location: [facility] OR;  Service:  General;  Laterality: N/A;   TONSILLECTOMY AND ADENOIDECTOMY      with above   UVULOPALATOPHARYNGOPLASTY  2004   Post op bleeding complication;  Dr Marinda Elk TOOTH EXTRACTION     Allergies  Allergen Reactions   Clomiphene Citrate     Other reaction(s): LFT elevation of  AST/ALT into 500's   Sulfa Antibiotics Other (See Comments)    Patient is not sure what reaction.       Objective:    Physical Exam Vitals and nursing note reviewed.  Constitutional:      General: He is not in acute distress.    Appearance: Normal appearance. He is obese.  HENT:     Head: Normocephalic.  Neck:   Cardiovascular:     Rate and Rhythm: Normal rate and regular rhythm.  Pulmonary:     Effort: Pulmonary effort is normal.     Breath sounds: Normal breath  sounds.  Musculoskeletal:        General: Normal range of motion.     Cervical back: Normal range of motion. Edema present. No erythema, signs of trauma or rigidity. No pain with movement or muscular tenderness. Normal range of motion.  Lymphadenopathy:     Cervical: No cervical adenopathy.  Skin:    General: Skin is warm and dry.  Neurological:     Mental Status: He is alert and oriented to person, place, and time.  Psychiatric:        Mood and Affect: Mood normal.    BP 138/85 (BP Location: Left Arm, Patient Position: Sitting, Cuff Size: Large)   Pulse (!) 110   Temp 97.7 F (36.5 C) (Temporal)   Ht 5\' 8"  (1.727 m)   Wt 264 lb 3.2 oz (119.8 kg)   SpO2 98%   BMI 40.17 kg/m  Wt Readings from Last 3 Encounters:  09/06/21 264 lb 3.2 oz (119.8 kg)  09/05/21 265 lb (120.2 kg)  08/02/21 263 lb 8 oz (119.5 kg)       10/03/21, NP

## 2021-09-07 ENCOUNTER — Ambulatory Visit
Admission: RE | Admit: 2021-09-07 | Discharge: 2021-09-07 | Disposition: A | Discharge status: Home / Self Care | Payer: No Typology Code available for payment source | Source: Ambulatory Visit | Attending: Family | Admitting: Family

## 2021-09-07 DIAGNOSIS — R221 Localized swelling, mass and lump, neck: Secondary | ICD-10-CM

## 2021-09-08 NOTE — Progress Notes (Signed)
The ultrasound was negative for any abnormality. Great news!   Have a good weekend!

## 2021-09-12 ENCOUNTER — Ambulatory Visit: Payer: 59 | Admitting: Internal Medicine

## 2021-09-18 ENCOUNTER — Other Ambulatory Visit: Payer: Self-pay

## 2021-09-18 MED ORDER — FREESTYLE LIBRE SENSOR SYSTEM MISC: 3 refills | Status: DC

## 2021-10-09 ENCOUNTER — Encounter: Payer: Self-pay | Admitting: Nurse Practitioner

## 2021-10-09 ENCOUNTER — Ambulatory Visit (INDEPENDENT_AMBULATORY_CARE_PROVIDER_SITE_OTHER): Payer: No Typology Code available for payment source | Admitting: Nurse Practitioner

## 2021-10-09 VITALS — BP 132/90 | HR 111 | Ht 68.0 in | Wt 267.0 lb

## 2021-10-09 DIAGNOSIS — R079 Chest pain, unspecified: Secondary | ICD-10-CM

## 2021-10-09 DIAGNOSIS — R1084 Generalized abdominal pain: Secondary | ICD-10-CM

## 2021-10-09 DIAGNOSIS — K219 Gastro-esophageal reflux disease without esophagitis: Secondary | ICD-10-CM | POA: Diagnosis not present

## 2021-10-09 MED ORDER — HYOSCYAMINE SULFATE 0.125 MG SL SUBL: 0.1250 mg | SUBLINGUAL_TABLET | Freq: Three times a day (TID) | SUBLINGUAL | 0 refills | Status: DC | PRN

## 2021-10-09 NOTE — Patient Instructions (Addendum)
If you are age 52 or older, your body mass index should be between 23-30. Your Body mass index is 40.6 kg/m. If this is out of the aforementioned range listed, please consider follow up with your Primary Care Provider.  If you are age 12 or younger, your body mass index should be between 19-25. Your Body mass index is 40.6 kg/m. If this is out of the aformentioned range listed, please consider follow up with your Primary Care Provider.   ________________________________________________________  The [facility] providers would like to encourage you to use [facility] to communicate with providers for non-urgent requests or questions.  Due to long hold times on the telephone, sending your provider a message by [facility] may be a faster and more efficient way to get a response.  Please allow 48 business hours for a response.  Please remember that this is for non-urgent requests.  _______________________________________________________   We have sent over a referral to [facility]. Someone from their office will contact you regarding an appointment.  Follow up with your cardiologist, Dr. Katrinka Blazing.  We have sent the following medications to your pharmacy for you to pick up at your convenience: Hyoscyamine one tablet under tongue every 8 hrs  You have been scheduled for a Barium Esophogram at [facility] (1st floor of the hospital) on 10/16/21 at 10 am. Please arrive 30 minutes prior to your appointment for registration. Make certain not to have anything to eat or drink 4 hours prior to your test. If you need to reschedule for any reason, please contact radiology at [phone] to do so. __________________________________________________________________ A barium swallow is an examination that concentrates on views of the esophagus. This tends to be a double contrast exam (barium and two liquids which, when combined, create a gas to distend the wall of the oesophagus) or single contrast  (non-ionic iodine based). The study is usually tailored to your symptoms so a good history is essential. Attention is paid during the study to the form, structure and configuration of the esophagus, looking for functional disorders (such as aspiration, dysphagia, achalasia, motility and reflux) EXAMINATION You may be asked to change into a gown, depending on the type of swallow being performed. A radiologist and radiographer will perform the procedure. The radiologist will advise you of the type of contrast selected for your procedure and direct you during the exam. You will be asked to stand, sit or lie in several different positions and to hold a small amount of fluid in your mouth before being asked to swallow while the imaging is performed .In some instances you may be asked to swallow barium coated marshmallows to assess the motility of a solid food bolus. The exam can be recorded as a digital or video fluoroscopy procedure. POST PROCEDURE It will take 1-2 days for the barium to pass through your system. To facilitate this, it is important, unless otherwise directed, to increase your fluids for the next 24-48hrs and to resume your normal diet.  This test typically takes about 30 minutes to perform.  Due to recent changes in healthcare laws, you may see the results of your imaging and laboratory studies on MyChart before your provider has had a chance to review them.  We understand that in some cases there may be results that are confusing or concerning to you. Not all laboratory results come back in the same time frame and the provider may be waiting for multiple results in order to interpret others.  Please  give [place] 48 hours in order for your provider to thoroughly review all the results before contacting the office for clarification of your results.    It was a pleasure to see you today!  Thank you for trusting me with your gastrointestinal care!      __________________________________________________________________________________

## 2021-10-09 NOTE — Progress Notes (Unsigned)
10/09/2021 Leemon [name] Juarbe [id number] 1970-11-22   CHIEF COMPLAINT: Epigastrici pain   HISTORY OF PRESENT ILLNESS: 51 year old male with a past medical history of anxiety, hyperlipidemia, diabetes mellitus type 2, vitamin D deficiency. S/P cholecystectomy 05/2019.  He presents to our office today as referred by Dr. Oliver Barre for further evaluation regarding generalized abdominal pain. He is taking Pantoprazole 40mg  bid and Pepcid Q HS. On PPI 1 to 2 years. Famotidine bid x 1 year.   He has mid chest pain, he takes TUMS which gives some relief. CP first started about one year ago. Almost daily discomfort which comes goes, triggered by eating or if belt is too tight. Lasts until he takes TUMs within 30 minute to 1 hours. If he doesn't take tums he takes lasts a few hours. No SOB etc.   He regurgitated food, gags to expel out the food, no episodes past 6 months, prior episode 3 to4 times first 6 months. ED. Stress level was high, anxiety high. Last year emotional year. Daughter 4 months. Father in law massive CVA died 3 weeks later. Financial stress.   He saw a cardiologist, few  Dr. 2020.   Bowel normal formed stools   # 2 generalized abd pain discomfort, not pain stomach always feels funny     He has gained 15lbs past 6 months.  Sits down moves belt line below  He sleeps on his side If he tries to sleep on his right side is uncomfortable  CC[facility]  EGD and colonoscopy  [facility]  EGD Normal esophagus 02/21/2021 Dr. 02/23/2021 Gastritis, biopsied Normal examined duodenum  Path report: Reactive gastropathy, No evidence of H. Pylori   Colonoscopy 07/2019 One 49mm tubular adenomatous polyp in the cecum removed with a host snamre Diverticulosis sigmoid colon Internal hemorrhoids  Bowel prep was adequate and good     Latest Ref Rng & Units 09/05/2021    7:30 PM 08/02/2021    5:02 PM 05/03/2021    4:30 PM  CBC  WBC 4.0 - 10.5 K/uL 9.5  11.5  8.5   Hemoglobin  13.0 - 17.0 g/dL 07/03/2021  84.1  66.0   Hematocrit 39.0 - 52.0 % 43.2  43.0  42.4   Platelets 150 - 400 K/uL 247  271.0  277.0        Latest Ref Rng & Units 08/02/2021    5:02 PM 05/03/2021    4:30 PM 03/17/2021    5:06 PM  CMP  Glucose 70 - 99 mg/dL 03/19/2021  160  109   BUN 6 - 23 mg/dL 15  13  15    Creatinine 0.40 - 1.50 mg/dL 323   5.57   Sodium 135 - 145 mEq/L 137  143  141   Potassium 3.5 - 5.1 mEq/L 3.8  4.2  3.9   Chloride 96 - 112 mEq/L 101  103  102   CO2 19 - 32 mEq/L 29  29  25    Calcium 8.4 - 10.5 mg/dL 9.0  9.2  9.1   Total Protein 6.0 - 8.3 g/dL 7.4  6.9  7.1   Total Bilirubin 0.2 - 1.2 mg/dL 0.3  0.3  0.3   Alkaline Phos 39 - 117 U/L 116  159    AST 0 - 37 U/L 15  23  17    ALT 0 - 53 U/L 39  104  42      CTAP 02/13/2023: COMPARISON:  06/06/2019   FINDINGS: Lower chest:  No acute abnormality   Hepatobiliary: No focal liver abnormality is seen. Status post cholecystectomy. No biliary dilatation.   Pancreas: No focal abnormality or ductal dilatation.   Spleen: No focal abnormality.  Normal size.   Adrenals/Urinary Tract: No adrenal abnormality. No focal renal abnormality. No stones or hydronephrosis. Urinary bladder is unremarkable.   Stomach/Bowel: Stomach, large and small bowel grossly unremarkable.   Vascular/Lymphatic: No evidence of aneurysm or adenopathy.   Reproductive: No visible focal abnormality.   Other: No free fluid or free air. Small umbilical hernia containing fat.   Musculoskeletal: No acute bony abnormality.   IMPRESSION: No acute findings in the abdomen or pelvis.   Small umbilical hernia containing fat.   Past Medical History:  Diagnosis Date   Abnormal EKG    NS ST-T EKG changes with negative Stress cardiolite 2003   Anxiety    Dehydration 2005   [place] , [place]   Diabetes mellitus without complication (HCC)    Hyperglycemia    Hyperlipidemia    IBS (irritable bowel syndrome)    OSA (obstructive sleep apnea)    resolved post  surgery   Past Surgical History:  Procedure Laterality Date   CHOLECYSTECTOMY N/A 06/07/2019   Procedure: LAPAROSCOPIC CHOLECYSTECTOMY;  Surgeon: Andria Meuse, MD;  Location: [facility] OR;  Service: General;  Laterality: N/A;   TONSILLECTOMY AND ADENOIDECTOMY      with above   UVULOPALATOPHARYNGOPLASTY  2004   Post op bleeding complication;  Dr Marinda Elk TOOTH EXTRACTION     Social History: Nonsmoker. No alcohol use. No drug use.   Family History: family history includes Cirrhosis in his brother; Colon cancer in his paternal grandfather; Diabetes in his maternal grandmother and paternal grandmother; Heart attack (age of onset: 23) in his maternal grandmother and paternal grandmother; Hyperlipidemia in his father and mother; Hypertension in his father and mother; Other in his mother.  Brother had autoimmune hepatitis died age 40. Identical twin.  Mother had head/neck cancer.     Outpatient Encounter Medications as of 10/09/2021  Medication Sig   cetirizine (ZYRTEC) 10 MG tablet Take 10 mg by mouth daily.   Continuous Blood Gluc Receiver (FREESTYLE LIBRE READER) DEVI Use as directed daily continuos E11.9   Continuous Blood Gluc Sensor (FREESTYLE LIBRE SENSOR SYSTEM) MISC Use as directed continous daily every 14 days E11.9   cyclobenzaprine (FLEXERIL) 5 MG tablet Take 1 tablet (5 mg total) by mouth 3 (three) times daily as needed for muscle spasms.   famotidine (PEPCID) 20 MG tablet Take 1 tablet (20 mg total) by mouth at bedtime.   fluticasone (FLONASE) 50 MCG/ACT nasal spray Place 2 sprays into both nostrils daily.   glucose blood test strip Use as instructed twice a day E 11.9 Contour Next Strips   ketoconazole (NIZORAL) 2 % shampoo Apply 1 application topically 2 (two) times a week.   losartan (COZAAR) 25 MG tablet TAKE 1 TABLET BY MOUTH  DAILY   metFORMIN (GLUCOPHAGE-XR) 500 MG 24 hr tablet TAKE 4 TABLETS BY MOUTH  ONCE DAILY WITH BREAKFAST   pantoprazole (PROTONIX) 40 MG tablet  TAKE 1 TABLET BY MOUTH  DAILY   Probiotic Product (ALIGN PO) Take 1 capsule by mouth daily.    rosuvastatin (CRESTOR) 10 MG tablet TAKE 1 TABLET BY MOUTH  DAILY   solifenacin (VESICARE) 5 MG tablet TAKE 1 TABLET BY MOUTH  DAILY   No facility-administered encounter medications on file as of 10/09/2021.     REVIEW OF SYSTEMS: All  other systems reviewed and negative except where noted in the History of Present Illness. Gen: Denies fever, sweats or chills. No weight loss.  CV: Denies chest pain, palpitations or edema. Resp: Denies cough, shortness of breath of hemoptysis.  GI: Denies heartburn, dysphagia, stomach or lower abdominal pain. No diarrhea or constipation.  GU : Denies urinary burning, blood in urine, increased urinary frequency or incontinence. MS: Denies joint pain, muscles aches or weakness. Derm: Denies rash, itchiness, skin lesions or unhealing ulcers. Psych: Denies depression, anxiety, memory loss, suicidal ideation and confusion. Heme: Denies bruising, easy bleeding. Neuro:  Denies headaches, dizziness or paresthesias. Endo:  Denies any problems with DM, thyroid or adrenal function.  PHYSICAL EXAM: There were no vitals taken for this visit. General: Well developed ... in no acute distress. Head: Normocephalic and atraumatic. Eyes:  Sclerae non-icteric, conjunctive pink. Ears: Normal auditory acuity. Mouth: Dentition intact. No ulcers or lesions.  Neck: Supple, no lymphadenopathy or thyromegaly.  Lungs: Clear bilaterally to auscultation without wheezes, crackles or rhonchi. Heart: Regular rate and rhythm. No murmur, rub or gallop appreciated.  Abdomen: Soft, nontender, non distended. No masses. No hepatosplenomegaly. Normoactive bowel sounds x 4 quadrants.  Rectal:  Musculoskeletal: Symmetrical with no gross deformities. Skin: Warm and dry. No rash or lesions on visible extremities. Extremities: No edema. Neurological: Alert oriented x 4, no focal deficits.   Psychological:  Alert and cooperative. Normal mood and affect.  ASSESSMENT AND PLAN:    CC:  Corwin Levins, MD

## 2021-10-15 NOTE — Progress Notes (Signed)
Reviewed and agree with management plans. Surveillance colonoscopy recommended in 2024 given the 66mm tubular adenoma removed on colonoscopy in 2021.   Rishi Vicario L. Tarri Glenn, MD, MPH

## 2021-10-16 ENCOUNTER — Ambulatory Visit (HOSPITAL_COMMUNITY)
Admission: RE | Admit: 2021-10-16 | Discharge: 2021-10-16 | Disposition: A | Discharge status: Home / Self Care | Payer: No Typology Code available for payment source | Source: Ambulatory Visit | Attending: Nurse Practitioner | Admitting: Nurse Practitioner

## 2021-10-16 DIAGNOSIS — R079 Chest pain, unspecified: Secondary | ICD-10-CM | POA: Insufficient documentation

## 2021-10-16 DIAGNOSIS — R1084 Generalized abdominal pain: Secondary | ICD-10-CM | POA: Insufficient documentation

## 2021-10-16 DIAGNOSIS — K219 Gastro-esophageal reflux disease without esophagitis: Secondary | ICD-10-CM | POA: Insufficient documentation

## 2021-10-28 ENCOUNTER — Other Ambulatory Visit: Payer: Self-pay | Admitting: Internal Medicine

## 2021-10-28 NOTE — Telephone Encounter (Signed)
Please refill as per office routine med refill policy (all routine meds to be refilled for 3 mo or monthly (per pt preference) up to one year from last visit, then month to month grace period for 3 mo, then further med refills will have to be denied) ? ?

## 2021-11-07 ENCOUNTER — Ambulatory Visit: Payer: No Typology Code available for payment source | Admitting: Internal Medicine

## 2021-11-14 NOTE — Telephone Encounter (Signed)
Pt stated that he recently had an office visit on 10/09/2021   had a Barium Swallow which was normal and is still having Bad indigestion and right sided abdominal pian: Pt wanting to know next steps: Please advise

## 2021-11-16 ENCOUNTER — Other Ambulatory Visit: Payer: Self-pay

## 2021-11-16 DIAGNOSIS — K219 Gastro-esophageal reflux disease without esophagitis: Secondary | ICD-10-CM

## 2021-11-16 MED ORDER — RABEPRAZOLE SODIUM 20 MG PO TBEC: 20.0000 mg | DELAYED_RELEASE_TABLET | Freq: Two times a day (BID) | ORAL | 1 refills | Status: DC

## 2021-11-16 NOTE — Telephone Encounter (Signed)
Pt contacted  Pt was made aware of  all Carl Best NP recommendations:  Pt made aware of prescription that was sent in to pharmacy: Pt scheduled for an OV with Dr. Tarri Glenn on 11/27/2021 at 8:50: Pt made aware:  Pt verbalized understanding with all questions answered.

## 2021-11-16 NOTE — Telephone Encounter (Signed)
Gregory Owens, patient has RUQ sono, CTAP and EGD since 01/2021. Past cholecystectomy.  He is on PPI and H2 blocker  bid.  Pls have him stop Pantoprazole. Send in RX for Rabeprazole 20mg  one po bid # 60, 1 refill. He can continue Famotidine 20mg  bid for now. Take Gaviscon 1 tablespoon tid PRN.  He was previously prescribed Hyoscyamine which he can take tid for abdominal pain. Pls schedule him for a follow up appt with Dr. Tarri Glenn. Thx

## 2021-11-20 ENCOUNTER — Other Ambulatory Visit (HOSPITAL_COMMUNITY): Payer: Self-pay

## 2021-11-20 ENCOUNTER — Telehealth: Payer: Self-pay

## 2021-11-20 NOTE — Telephone Encounter (Signed)
Pharmacy Patient Advocate Encounter   Received notification from [employer] that prior authorization for RABEprazole Sodium 20MG  dr tablets is required/requested.  Per Test Claim: 1 tablet daily is covered   PA submitted on 11/20/2021 to [employer] via CoverMyMeds Key [id number] Status is pending

## 2021-11-21 ENCOUNTER — Telehealth: Payer: Self-pay | Admitting: Nurse Practitioner

## 2021-11-21 NOTE — Telephone Encounter (Signed)
Pt pharmacy was contacted and stated that the medication is  requiring a high dose PA from pt insurance: Please advise

## 2021-11-21 NOTE — Telephone Encounter (Signed)
Patient is returning your call.  

## 2021-11-22 ENCOUNTER — Other Ambulatory Visit (HOSPITAL_COMMUNITY): Payer: Self-pay

## 2021-11-22 NOTE — Telephone Encounter (Signed)
Pt was made aware of the PA approval for the Rabeprazole   Pt verbalized understanding with all questions answered.

## 2021-11-22 NOTE — Telephone Encounter (Signed)
Pt was made aware of PA approval for medication Rabeprazole  Pt verbalized understanding with all questions answered.

## 2021-11-22 NOTE — Telephone Encounter (Signed)
Left message for pt to call back  °

## 2021-11-22 NOTE — Telephone Encounter (Signed)
Patient Advocate Encounter  Prior Authorization for RABEprazole Sodium 20MG  dr tablets has been approved.    PA# [id number] Key: [id number]  Effective dates: 11/21/2021 through 11/21/2022

## 2021-11-27 ENCOUNTER — Encounter: Payer: Self-pay | Admitting: Gastroenterology

## 2021-11-27 ENCOUNTER — Ambulatory Visit (INDEPENDENT_AMBULATORY_CARE_PROVIDER_SITE_OTHER): Payer: No Typology Code available for payment source | Admitting: Gastroenterology

## 2021-11-27 VITALS — BP 140/86 | HR 109 | Ht 68.0 in | Wt 264.0 lb

## 2021-11-27 DIAGNOSIS — K429 Umbilical hernia without obstruction or gangrene: Secondary | ICD-10-CM | POA: Diagnosis not present

## 2021-11-27 DIAGNOSIS — R1084 Generalized abdominal pain: Secondary | ICD-10-CM

## 2021-11-27 MED ORDER — HYOSCYAMINE SULFATE 0.125 MG SL SUBL: 0.1250 mg | SUBLINGUAL_TABLET | Freq: Three times a day (TID) | SUBLINGUAL | 3 refills | Status: DC | PRN

## 2021-11-27 NOTE — Progress Notes (Signed)
Referring Provider: Corwin Levins, MD Primary Care Physician:  Gregory Levins, MD  Chief Complaint:  Abdominal pain   IMPRESSION:  Primarily post-prandial ongoing abdominal pain not explained by ultrasound, CT, EGD, or esophagram.  Worsened by anxiety.  Fat-containing umbilical hernia seen on prior imaging. Not thought to be contributing to his symptoms.   History of colon polyps. Surveillance colonoscopy recommended in 2024 given the 40mm tubular adenoma removed on colonoscopy in 2021.   Fatty liver on imaging.  Liver enzymes have been normal.  Anxiety.  Follow-up with PCP as this may be contributing to symptoms. His primary concern today if that he might have cancer.   PLAN: - Follow-up with Cardiology - Continue famotidine 20 mg BID - Switch pantoprazole with rabeprazole 40 mg QAM - Switch hyoscyamine to prior to meals - Consider esophageal manometry +/- EGD with esophageal biopsies if symptoms persist despite these medication changes - Referral to healthy weight and wellness - Colonoscopy due 2024  HPI: Gregory Owens is a 51 y.o. male who returns in follow-up.  He was last seen in the office by Gregory Owens 10/09/2021 for generalized abdominal pain, reflux, chest pain associated with eating, and intermittent food regurgitation.  This is my first office visit with Gregory Owens.  He was previously followed at [facility].  He has anxiety, hyperlipidemia, diabetes mellitus type 2, vitamin D deficiency. He had a cholecystectomy 05/2019 for different symptoms at that time. 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   He has had frequent heartburn with mid esophageal pain intermittently over the last couple of years that did not been relieved with pantoprazole, Pepcid, or Tums.  Esophageal pain is triggered by eating but also occurs if his belt is too tight.  He has had episodes of further regurgitation 3-4 times over the last year.  He has gained 15 pounds.  Stress level is  high as his father-in-law died from a stroke recently, the birth of his daughter, and some financial challenges.  He acknowledges having significant anxiety.  Particularly worse if he lies on his right side. Tolerates lying on the left side. He will wear his belt low to minimize the symptoms.  It feels like acid reflex. He wondered if this could be due to a bile leak after his cholecystectomy.  Evaluation has included: - Right upper quadrant ultrasound 01/31/2021 showed fatty liver and changes of prior cholecystectomy - CT of the abdomen and pelvis 01/2021: Fat-containing umbilical hernia otherwise unremarkable - EGD with Dr. Bosie Clos 02/21/2021 showed a normal esophagus and gastritis.  Pathology reports showed reactive gastropathy without evidence of H. pylori. No esophageal biopsies obtained.  - Esophagram 10/16/21: Normal, no source of symptoms identified - Cardiac evaluation with Dr. Katrinka Blazing negative in the distant past (not recently)  Previously treated with hyoscyamine, Gaviscon, famotidine, and recent switch from pantoprazole to rabeprazole. Although he has not yet started the rabeprazole.  The hyoscyamine will help with the symptoms more than anything else. He has required it 2-3 times a week.   Colon cancer screening history: - Colonoscopy with Dr. Bosie Clos 07/2019 showed a 16 mm tubular adenomatous polyp in the cecum and diverticulosis of the sigmoid colon. Surveillance recommended in 2024.  Paternal grandfather had colon cancer.  His identical twin brother died at the age of 54 from autoimmune hepatitis.  Mother had head neck cancer.  There is no other known family history of GI malignancy.   Past Medical History:  Diagnosis Date   Abnormal  EKG    NS ST-T EKG changes with negative Stress cardiolite 2003   Anxiety    Dehydration 01/30/2003   [place] , [place]   Diabetes mellitus without complication (HCC)    GERD (gastroesophageal reflux disease)    Hyperglycemia    Hyperlipidemia     IBS (irritable bowel syndrome)    Obesity    OSA (obstructive sleep apnea)    resolved post surgery    Past Surgical History:  Procedure Laterality Date   CHOLECYSTECTOMY N/A 06/07/2019   Procedure: LAPAROSCOPIC CHOLECYSTECTOMY;  Surgeon: Andria Meuse, MD;  Location: [facility] OR;  Service: General;  Laterality: N/A;   TONSILLECTOMY AND ADENOIDECTOMY      with above   UVULOPALATOPHARYNGOPLASTY  2004   Post op bleeding complication;  Dr Marinda Elk TOOTH EXTRACTION     Current Outpatient Medications  Medication Sig Dispense Refill   cetirizine (ZYRTEC) 10 MG tablet Take 10 mg by mouth daily.     Continuous Blood Gluc Receiver (FREESTYLE LIBRE READER) DEVI Use as directed daily continuos E11.9 1 each 0   Continuous Blood Gluc Sensor (FREESTYLE LIBRE SENSOR SYSTEM) MISC Use as directed continous daily every 14 days E11.9 6 each 3   cyclobenzaprine (FLEXERIL) 5 MG tablet Take 1 tablet (5 mg total) by mouth 3 (three) times daily as needed for muscle spasms. 30 tablet 1   famotidine (PEPCID) 20 MG tablet Take 1 tablet (20 mg total) by mouth at bedtime. 90 tablet 3   fluticasone (FLONASE) 50 MCG/ACT nasal spray Place 2 sprays into both nostrils daily. 16 g 6   glucose blood test strip Use as instructed twice a day E 11.9 Contour Next Strips 100 each 2   ketoconazole (NIZORAL) 2 % shampoo Apply 1 application topically 2 (two) times a week. 120 mL 6   losartan (COZAAR) 25 MG tablet TAKE 1 TABLET BY MOUTH  DAILY 90 tablet 3   metFORMIN (GLUCOPHAGE-XR) 500 MG 24 hr tablet TAKE 4 TABLETS BY MOUTH  ONCE DAILY WITH BREAKFAST 360 tablet 3   Probiotic Product (ALIGN PO) Take 1 capsule by mouth daily.      rosuvastatin (CRESTOR) 10 MG tablet TAKE 1 TABLET BY MOUTH  DAILY 90 tablet 3   solifenacin (VESICARE) 5 MG tablet TAKE 1 TABLET BY MOUTH  DAILY 90 tablet 3   venlafaxine XR (EFFEXOR-XR) 75 MG 24 hr capsule Take 225 mg by mouth daily.     hyoscyamine (LEVSIN SL) 0.125 MG SL tablet Place 1 tablet  (0.125 mg total) under the tongue every 8 (eight) hours as needed (Esophageal pain or abdominal pain). 90 tablet 3   RABEprazole (ACIPHEX) 20 MG tablet Take 1 tablet (20 mg total) by mouth 2 (two) times daily. (Patient not taking: Reported on 11/27/2021) 60 tablet 1   No current facility-administered medications for this visit.    Allergies as of 11/27/2021 - Review Complete 11/27/2021  Allergen Reaction Noted   Clomiphene citrate  02/17/2021   Sulfa antibiotics Other (See Comments) 03/16/2020     Physical Exam: General:   Alert,  well-nourished, pleasant and cooperative in NAD Head:  Normocephalic and atraumatic. Eyes:  Sclera clear, no icterus.   Conjunctiva pink. Abdomen:  Soft, nontender, nondistended, normal bowel sounds, no rebound or guarding. No hepatosplenomegaly.   Extremities:  No clubbing or edema. Neurologic:  Alert and  oriented x4;  grossly nonfocal Skin:  Intact without significant lesions or rashes. Psych:  Alert and cooperative. Normal mood and affect.  Yetzali Weld L. Tarri Glenn, MD, MPH 12/10/2021, 8:14 PM

## 2021-11-27 NOTE — Patient Instructions (Addendum)
Continue famotidine twice daily.  Switch pantoprazole to rabeprazole.  Start taking hyoscyamine prior to meals. Hopefully this will provide some increased relief.   Please keep a food diary to try to identify food triggers.   We discussed a referral to health weight and wellness.  Weight loss is likely to improve your symptoms.   Please let me know if you aren't feeling better with this strategy.  You should have a colonoscopy in 2024.   _______________________________________________________  If you are age 2 or older, your body mass index should be between 23-30. Your Body mass index is 40.14 kg/m. If this is out of the aforementioned range listed, please consider follow up with your Primary Care Provider.  If you are age 68 or younger, your body mass index should be between 19-25. Your Body mass index is 40.14 kg/m. If this is out of the aformentioned range listed, please consider follow up with your Primary Care Provider.   ________________________________________________________  The [facility] providers would like to encourage you to use [facility] to communicate with providers for non-urgent requests or questions.  Due to long hold times on the telephone, sending your provider a message by [facility] may be a faster and more efficient way to get a response.  Please allow 48 business hours for a response.  Please remember that this is for non-urgent requests.  _______________________________________________________

## 2021-12-10 ENCOUNTER — Encounter: Payer: Self-pay | Admitting: Gastroenterology

## 2021-12-18 ENCOUNTER — Encounter: Payer: No Typology Code available for payment source | Admitting: Internal Medicine

## 2021-12-19 ENCOUNTER — Encounter: Payer: No Typology Code available for payment source | Admitting: Internal Medicine

## 2021-12-20 ENCOUNTER — Other Ambulatory Visit: Payer: Self-pay | Admitting: Internal Medicine

## 2021-12-21 ENCOUNTER — Other Ambulatory Visit: Payer: Self-pay | Admitting: Internal Medicine

## 2021-12-21 NOTE — Telephone Encounter (Signed)
Please refill as per office routine med refill policy (all routine meds to be refilled for 3 mo or monthly (per pt preference) up to one year from last visit, then month to month grace period for 3 mo, then further med refills will have to be denied) ? ?

## 2021-12-22 ENCOUNTER — Other Ambulatory Visit: Payer: Self-pay | Admitting: Internal Medicine

## 2021-12-24 NOTE — Telephone Encounter (Signed)
For metformin only  - Please refill as per office routine med refill policy (all routine meds to be refilled for 3 mo or monthly (per pt preference) up to one year from last visit, then month to month grace period for 3 mo, then further med refills will have to be denied)

## 2021-12-27 ENCOUNTER — Other Ambulatory Visit: Payer: Self-pay | Admitting: Internal Medicine

## 2021-12-27 NOTE — Telephone Encounter (Signed)
Please refill as per office routine med refill policy (all routine meds to be refilled for 3 mo or monthly (per pt preference) up to one year from last visit, then month to month grace period for 3 mo, then further med refills will have to be denied) ? ?

## 2022-01-03 ENCOUNTER — Ambulatory Visit (INDEPENDENT_AMBULATORY_CARE_PROVIDER_SITE_OTHER): Payer: No Typology Code available for payment source

## 2022-01-03 ENCOUNTER — Ambulatory Visit (INDEPENDENT_AMBULATORY_CARE_PROVIDER_SITE_OTHER): Payer: No Typology Code available for payment source | Admitting: Internal Medicine

## 2022-01-03 VITALS — BP 130/80 | HR 100 | Temp 98.2°F | Ht 68.0 in | Wt 266.0 lb

## 2022-01-03 DIAGNOSIS — Z125 Encounter for screening for malignant neoplasm of prostate: Secondary | ICD-10-CM

## 2022-01-03 DIAGNOSIS — E7800 Pure hypercholesterolemia, unspecified: Secondary | ICD-10-CM | POA: Diagnosis not present

## 2022-01-03 DIAGNOSIS — M25561 Pain in right knee: Secondary | ICD-10-CM

## 2022-01-03 DIAGNOSIS — E538 Deficiency of other specified B group vitamins: Secondary | ICD-10-CM

## 2022-01-03 DIAGNOSIS — E559 Vitamin D deficiency, unspecified: Secondary | ICD-10-CM

## 2022-01-03 DIAGNOSIS — E1165 Type 2 diabetes mellitus with hyperglycemia: Secondary | ICD-10-CM

## 2022-01-03 LAB — BASIC METABOLIC PANEL
BUN: 12 mg/dL (ref 6–23)
CO2: 30 mEq/L (ref 19–32)
Calcium: 8.8 mg/dL (ref 8.4–10.5)
Chloride: 101 mEq/L (ref 96–112)
Creatinine, Ser: 0.94 mg/dL (ref 0.40–1.50)
GFR: 93.18 mL/min (ref >60.00)
Glucose, Bld: 263 mg/dL — ABNORMAL HIGH (ref 70–99)
Potassium: 3.6 mEq/L (ref 3.5–5.1)
Sodium: 137 mEq/L (ref 135–145)

## 2022-01-03 LAB — LIPID PANEL
Cholesterol: 144 mg/dL (ref 0–200)
HDL: 40.2 mg/dL (ref >39.00)
LDL Cholesterol: 70 mg/dL (ref 0–99)
NonHDL: 103.38
Total CHOL/HDL Ratio: 4
Triglycerides: 167 mg/dL — ABNORMAL HIGH (ref 0.0–149.0)
VLDL: 33.4 mg/dL (ref 0.0–40.0)

## 2022-01-03 LAB — HEPATIC FUNCTION PANEL
ALT: 17 U/L (ref 0–53)
AST: 13 U/L (ref 0–37)
Albumin: 3.9 g/dL (ref 3.5–5.2)
Alkaline Phosphatase: 70 U/L (ref 39–117)
Bilirubin, Direct: 0.1 mg/dL (ref 0.0–0.3)
Total Bilirubin: 0.3 mg/dL (ref 0.2–1.2)
Total Protein: 7.2 g/dL (ref 6.0–8.3)

## 2022-01-03 LAB — HEMOGLOBIN A1C: Hgb A1c MFr Bld: 8.3 % — ABNORMAL HIGH (ref 4.6–6.5)

## 2022-01-03 LAB — VITAMIN D 25 HYDROXY (VIT D DEFICIENCY, FRACTURES): VITD: 23.37 ng/mL — ABNORMAL LOW (ref 30.00–100.00)

## 2022-01-03 MED ORDER — TIRZEPATIDE 2.5 MG/0.5ML ~~LOC~~ SOAJ: 2.5000 mg | SUBCUTANEOUS | 3 refills | Status: DC

## 2022-01-03 NOTE — Patient Instructions (Addendum)
Please remember to call for your yearly eye exam  Please take all new medication as prescribed  - the mounjaro  Please continue all other medications as before, including the OTC Voltaren gel as needed for pain  Please have the pharmacy call with any other refills you may need.  Please continue your efforts at being more active, low cholesterol diet, and weight control..  Please keep your appointments with your specialists as you may have planned  You will be contacted regarding the referral for: Sports medicine  Please go to the XRAY Department in the first floor for the x-ray testing  Please go to the LAB at the blood drawing area for the tests to be done  You will be contacted by phone if any changes need to be made immediately.  Otherwise, you will receive a letter about your results with an explanation, but please check with MyChart first.  Please remember to sign up for MyChart if you have not done so, as this will be important to you in the future with finding out test results, communicating by private email, and scheduling acute appointments online when needed.

## 2022-01-03 NOTE — Progress Notes (Signed)
Patient ID: Gregory Owens, male   DOB: [birth year], 51 y.o.   MRN: [id number]        Chief Complaint: follow up HTN, HLD, DM and right knee pain swelling       HPI:  Gregory Owens is a 51 y.o. male here overall doing ok, difficult to lose wt recently and sugars have been mildly higher, Pt denies chest pain, increased sob or doe, wheezing, orthopnea, PND, increased LE swelling, palpitations, dizziness or syncope.   Pt denies polydipsia, polyuria, or new focal neuro s/s.    Pt denies fever, wt loss, night sweats, loss of appetite, or other constitutional symptoms  Also has1 mo onset right knee lateral pain and localized swelling, denies other significant trauma, fall or giveaways,  No prior hx same.         Wt Readings from Last 3 Encounters:  01/03/22 266 lb (120.7 kg)  11/27/21 264 lb (119.7 kg)  10/09/21 267 lb (121.1 kg)   BP Readings from Last 3 Encounters:  01/03/22 130/80  11/27/21 (!) 140/86  10/09/21 (!) 132/90         Past Medical History:  Diagnosis Date   Abnormal EKG    NS ST-T EKG changes with negative Stress cardiolite 2003   Anxiety    Dehydration 01/30/2003   [place] , [place]   Diabetes mellitus without complication (HCC)    GERD (gastroesophageal reflux disease)    Hyperglycemia    Hyperlipidemia    IBS (irritable bowel syndrome)    Obesity    OSA (obstructive sleep apnea)    resolved post surgery   Past Surgical History:  Procedure Laterality Date   CHOLECYSTECTOMY N/A 06/07/2019   Procedure: LAPAROSCOPIC CHOLECYSTECTOMY;  Surgeon: Andria Meuse, MD;  Location: [facility] OR;  Service: General;  Laterality: N/A;   TONSILLECTOMY AND ADENOIDECTOMY      with above   UVULOPALATOPHARYNGOPLASTY  2004   Post op bleeding complication;  Dr Marinda Elk TOOTH EXTRACTION      reports that he has never smoked. He has never used smokeless tobacco. He reports that he does not drink alcohol and does not use drugs. family history includes Cirrhosis in his  brother; Colon cancer in his paternal grandfather; Diabetes in his maternal grandmother and paternal grandmother; Heart attack (age of onset: 59) in his maternal grandmother and paternal grandmother; Hyperlipidemia in his father and mother; Hypertension in his father and mother; Other in his mother. Allergies  Allergen Reactions   Clomiphene Citrate     Other reaction(s): LFT elevation of  AST/ALT into 500's   Sulfa Antibiotics Other (See Comments)    Patient is not sure what reaction.    Current Outpatient Medications on File Prior to Visit  Medication Sig Dispense Refill   cetirizine (ZYRTEC) 10 MG tablet Take 10 mg by mouth daily.     Continuous Blood Gluc Receiver (FREESTYLE LIBRE READER) DEVI Use as directed daily continuos E11.9 1 each 0   Continuous Blood Gluc Sensor (FREESTYLE LIBRE SENSOR SYSTEM) MISC Use as directed continous daily every 14 days E11.9 6 each 3   cyclobenzaprine (FLEXERIL) 5 MG tablet Take 1 tablet (5 mg total) by mouth 3 (three) times daily as needed for muscle spasms. 30 tablet 1   famotidine (PEPCID) 20 MG tablet Take 1 tablet (20 mg total) by mouth at bedtime. 90 tablet 3   fluticasone (FLONASE) 50 MCG/ACT nasal spray Place 2 sprays into both nostrils daily. 16 g 6  glucose blood test strip Use as instructed twice a day E 11.9 Contour Next Strips 100 each 2   hyoscyamine (LEVSIN SL) 0.125 MG SL tablet Place 1 tablet (0.125 mg total) under the tongue every 8 (eight) hours as needed (Esophageal pain or abdominal pain). 90 tablet 3   ketoconazole (NIZORAL) 2 % shampoo Apply 1 application topically 2 (two) times a week. 120 mL 6   losartan (COZAAR) 25 MG tablet TAKE 1 TABLET BY MOUTH  DAILY 90 tablet 3   metFORMIN (GLUCOPHAGE-XR) 500 MG 24 hr tablet TAKE 4 TABLETS BY MOUTH ONCE  DAILY WITH BREAKFAST 360 tablet 3   Probiotic Product (ALIGN PO) Take 1 capsule by mouth daily.      RABEprazole (ACIPHEX) 20 MG tablet Take 1 tablet (20 mg total) by mouth 2 (two) times  daily. 60 tablet 1   rosuvastatin (CRESTOR) 10 MG tablet TAKE 1 TABLET BY MOUTH DAILY 90 tablet 1   solifenacin (VESICARE) 5 MG tablet TAKE 1 TABLET BY MOUTH DAILY 90 tablet 1   venlafaxine XR (EFFEXOR-XR) 75 MG 24 hr capsule Take 225 mg by mouth daily.     No current facility-administered medications on file prior to visit.        ROS:  All others reviewed and negative.  Objective        PE:  BP 130/80 (BP Location: Right Arm, Patient Position: Sitting, Cuff Size: Large)   Pulse 100   Temp 98.2 F (36.8 C) (Oral)   Ht 5\' 8"  (1.727 m)   Wt 266 lb (120.7 kg)   SpO2 95%   BMI 40.45 kg/m                 Constitutional: Pt appears in NAD               HENT: Head: NCAT.                Right Ear: External ear normal.                 Left Ear: External ear normal.                Eyes: . Pupils are equal, round, and reactive to light. Conjunctivae and EOM are normal               Nose: without d/c or deformity               Neck: Neck supple. Gross normal ROM               Cardiovascular: Normal rate and regular rhythm.                 Pulmonary/Chest: Effort normal and breath sounds without rales or wheezing.                Abd:  Soft, NT, ND, + BS, no organomegaly               Neurological: Pt is alert. At baseline orientation, motor grossly intact               Skin: Skin is warm. No rashes, no other new lesions, LE edema - none               Right knee with localized tender swelling at the lateral joint line               Psychiatric: Pt behavior is normal without agitation   Micro: none  Cardiac tracings I have personally interpreted today:  none  Pertinent Radiological findings (summarize): none   Lab Results  Component Value Date   WBC 9.5 09/05/2021   HGB 14.4 09/05/2021   HCT 43.2 09/05/2021   PLT 247 09/05/2021   GLUCOSE 263 (H) 01/03/2022   CHOL 144 01/03/2022   TRIG 167.0 (H) 01/03/2022   HDL 40.20 01/03/2022   LDLDIRECT 53.0 08/29/2017   LDLCALC 70  01/03/2022   ALT 17 01/03/2022   AST 13 01/03/2022   NA 137 01/03/2022   K 3.6 01/03/2022   CL 101 01/03/2022   CREATININE 0.94 01/03/2022   BUN 12 01/03/2022   CO2 30 01/03/2022   TSH 2.115 09/05/2021   PSA 0.54 05/03/2021   HGBA1C 8.3 (H) 01/03/2022   MICROALBUR 0.8 05/04/2021   Assessment/Plan:  Gregory Owens is a 51 y.o. Black or African American [2] male with  has a past medical history of Abnormal EKG, Anxiety, Dehydration (01/30/2003), Diabetes mellitus without complication (HCC), GERD (gastroesophageal reflux disease), Hyperglycemia, Hyperlipidemia, IBS (irritable bowel syndrome), Obesity, and OSA (obstructive sleep apnea).  Diabetes (HCC) Lab Results  Component Value Date   HGBA1C 8.3 (H) 01/03/2022   Worsening uncontrolled,, pt to continue current medical treatment metformin ER 500 mg - 4 qd but also start mounjaro for sugar and wt control 2.5 mg weekly   Hyperlipidemia Lab Results  Component Value Date   LDLCALC 70 01/03/2022   Uncontrolled, goal ldl < 70, pt to continue current statin crestor 10 mg as declines change for now   Vitamin D deficiency Last vitamin D Lab Results  Component Value Date   VD25OH 23.37 (L) 01/03/2022   Low, to start oral replacement   Right knee pain Recent onset, suspect cartilage damage, for xray, tylenol prn, refer sport medicine  Followup: Return in about 6 months (around 07/05/2022).  Oliver Barre, MD 01/06/2022 2:29 PM [facility]

## 2022-01-04 ENCOUNTER — Telehealth: Payer: Self-pay | Admitting: *Deleted

## 2022-01-04 NOTE — Telephone Encounter (Signed)
Pt need PA on Mounjaro.[name] Submitted w/(Key: [id number]) PA sent to [employer].Marland KitchenRaechel Chute

## 2022-01-05 NOTE — Telephone Encounter (Signed)
Rec'd determination " Request Reference Number: [id number]. MOUNJARO INJ 2.5/0.5 is approved through 01/05/2023. " Faxed approval to [facility].Marland KitchenRaechel Chute

## 2022-01-06 ENCOUNTER — Encounter: Payer: Self-pay | Admitting: Internal Medicine

## 2022-01-06 DIAGNOSIS — M25561 Pain in right knee: Secondary | ICD-10-CM | POA: Insufficient documentation

## 2022-01-06 NOTE — Assessment & Plan Note (Signed)
Lab Results  Component Value Date   LDLCALC 70 01/03/2022   Uncontrolled, goal ldl < 70, pt to continue current statin crestor 10 mg as declines change for now

## 2022-01-06 NOTE — Assessment & Plan Note (Signed)
Lab Results  Component Value Date   HGBA1C 8.3 (H) 01/03/2022   Worsening uncontrolled,, pt to continue current medical treatment metformin ER 500 mg - 4 qd but also start mounjaro for sugar and wt control 2.5 mg weekly

## 2022-01-06 NOTE — Assessment & Plan Note (Signed)
Recent onset, suspect cartilage damage, for xray, tylenol prn, refer sport medicine

## 2022-01-06 NOTE — Assessment & Plan Note (Signed)
Last vitamin D Lab Results  Component Value Date   VD25OH 23.37 (L) 01/03/2022   Low, to start oral replacement

## 2022-01-08 NOTE — Progress Notes (Deleted)
   I, Philbert Riser, LAT, ATC acting as a scribe for [name], MD.  Subjective:    CC: R knee pain  HPI: Pt is a 51 y/o male c/o R knee pain ongoing for about 1+ months. Pt locates pain to   R Knee swelling: Mechanical symptoms: Radiates: Aggravates: Treatments tried:  Dx imaging: 01/03/22 R knee XR  Pertinent review of Systems: ***  Relevant historical information: ***   Objective:   There were no vitals filed for this visit. General: Well Developed, well nourished, and in no acute distress.   MSK: ***  Lab and Radiology Results  EXAM: RIGHT KNEE - COMPLETE 4+ VIEW   COMPARISON:  None Available.   FINDINGS: Minimal chronic enthesopathic change at the patellar insertion on the tibial tubercle. Mild chronic enthesopathic change at the quadriceps insertion on the superior patella. Minimal superior patellar degenerative spur. No joint effusion. No acute fracture or dislocation.   IMPRESSION: Minimal superior patellar degenerative spur.   Mild chronic enthesopathic change at the quadriceps insertion on the patella.     Electronically Signed   By: Neita Garnet M.D.   On: 01/04/2022 10:04 I, [name], personally (independently) visualized and performed the interpretation of the images attached in this note.    Impression and Recommendations:    Assessment and Plan: 51 y.o. male with ***.  PDMP not reviewed this encounter. No orders of the defined types were placed in this encounter.  No orders of the defined types were placed in this encounter.   Discussed warning signs or symptoms. Please see discharge instructions. Patient expresses understanding.   ***

## 2022-01-09 ENCOUNTER — Ambulatory Visit: Payer: No Typology Code available for payment source | Admitting: Family Medicine

## 2022-01-09 DIAGNOSIS — G8929 Other chronic pain: Secondary | ICD-10-CM

## 2022-01-25 ENCOUNTER — Encounter: Payer: Self-pay | Admitting: Internal Medicine

## 2022-01-25 ENCOUNTER — Ambulatory Visit (INDEPENDENT_AMBULATORY_CARE_PROVIDER_SITE_OTHER): Payer: No Typology Code available for payment source | Admitting: Internal Medicine

## 2022-01-25 VITALS — BP 128/78 | HR 90 | Temp 98.2°F | Resp 16 | Ht 68.0 in | Wt 261.0 lb

## 2022-01-25 DIAGNOSIS — H6502 Acute serous otitis media, left ear: Secondary | ICD-10-CM | POA: Diagnosis not present

## 2022-01-25 MED ORDER — AMOXICILLIN-POT CLAVULANATE 875-125 MG PO TABS: 1.0000 | ORAL_TABLET | Freq: Two times a day (BID) | ORAL | 0 refills | Status: AC

## 2022-01-25 MED ORDER — HYDROCODONE-ACETAMINOPHEN 5-325 MG PO TABS: 1.0000 | ORAL_TABLET | Freq: Four times a day (QID) | ORAL | 0 refills | Status: DC | PRN

## 2022-01-25 MED ORDER — HYDROCODONE-ACETAMINOPHEN 5-325 MG PO TABS: 1.0000 | ORAL_TABLET | Freq: Four times a day (QID) | ORAL | 0 refills | Status: AC | PRN

## 2022-01-25 NOTE — Patient Instructions (Signed)

## 2022-01-25 NOTE — Progress Notes (Signed)
Subjective:  Patient ID: Gregory Owens, male    DOB: 1970/04/15  Age: 51 y.o. MRN: [id number]  CC: Sore Throat   HPI Gregory Owens presents for sore throat -  He complains of a 4 day history of ST and left earache. He has not gotten much symptom relief with motrin. COVID test was negative.  Outpatient Medications Prior to Visit  Medication Sig Dispense Refill   cetirizine (ZYRTEC) 10 MG tablet Take 10 mg by mouth daily.     Continuous Blood Gluc Receiver (FREESTYLE LIBRE READER) DEVI Use as directed daily continuos E11.9 1 each 0   Continuous Blood Gluc Sensor (FREESTYLE LIBRE SENSOR SYSTEM) MISC Use as directed continous daily every 14 days E11.9 6 each 3   cyclobenzaprine (FLEXERIL) 5 MG tablet Take 1 tablet (5 mg total) by mouth 3 (three) times daily as needed for muscle spasms. 30 tablet 1   famotidine (PEPCID) 20 MG tablet Take 1 tablet (20 mg total) by mouth at bedtime. 90 tablet 3   fluticasone (FLONASE) 50 MCG/ACT nasal spray Place 2 sprays into both nostrils daily. 16 g 6   glucose blood test strip Use as instructed twice a day E 11.9 Contour Next Strips 100 each 2   hyoscyamine (LEVSIN SL) 0.125 MG SL tablet Place 1 tablet (0.125 mg total) under the tongue every 8 (eight) hours as needed (Esophageal pain or abdominal pain). 90 tablet 3   ketoconazole (NIZORAL) 2 % shampoo Apply 1 application topically 2 (two) times a week. 120 mL 6   losartan (COZAAR) 25 MG tablet TAKE 1 TABLET BY MOUTH  DAILY 90 tablet 3   metFORMIN (GLUCOPHAGE-XR) 500 MG 24 hr tablet TAKE 4 TABLETS BY MOUTH ONCE  DAILY WITH BREAKFAST 360 tablet 3   Probiotic Product (ALIGN PO) Take 1 capsule by mouth daily.      RABEprazole (ACIPHEX) 20 MG tablet Take 1 tablet (20 mg total) by mouth 2 (two) times daily. 60 tablet 1   rosuvastatin (CRESTOR) 10 MG tablet TAKE 1 TABLET BY MOUTH DAILY 90 tablet 1   solifenacin (VESICARE) 5 MG tablet TAKE 1 TABLET BY MOUTH DAILY 90 tablet 1   tirzepatide  (MOUNJARO) 2.5 MG/0.5ML Pen Inject 2.5 mg into the skin once a week. 6 mL 3   venlafaxine XR (EFFEXOR-XR) 75 MG 24 hr capsule Take 225 mg by mouth daily.     No facility-administered medications prior to visit.    ROS Review of Systems  Constitutional: Negative.  Negative for chills, diaphoresis, fatigue and fever.  HENT:  Positive for ear pain and sore throat. Negative for hearing loss, sinus pressure, trouble swallowing and voice change.   Eyes: Negative.   Respiratory: Negative.  Negative for cough, chest tightness, shortness of breath and wheezing.   Cardiovascular:  Negative for chest pain, palpitations and leg swelling.  Gastrointestinal:  Negative for abdominal pain, constipation, diarrhea, nausea and vomiting.  Endocrine: Negative.   Genitourinary: Negative.  Negative for difficulty urinating and dysuria.  Musculoskeletal: Negative.  Negative for arthralgias and myalgias.  Skin: Negative.   Allergic/Immunologic: Negative.   Neurological: Negative.  Negative for dizziness and weakness.  Hematological:  Negative for adenopathy. Does not bruise/bleed easily.  Psychiatric/Behavioral: Negative.      Objective:  BP 128/78 (BP Location: Right Arm, Patient Position: Sitting, Cuff Size: Large)   Pulse 90   Temp 98.2 F (36.8 C) (Oral)   Resp 16   Ht 5\' 8"  (1.727 m)   Wt 261  lb (118.4 kg)   SpO2 95%   BMI 39.68 kg/m   BP Readings from Last 3 Encounters:  01/25/22 128/78  01/03/22 130/80  11/27/21 (!) 140/86    Wt Readings from Last 3 Encounters:  01/25/22 261 lb (118.4 kg)  01/03/22 266 lb (120.7 kg)  11/27/21 264 lb (119.7 kg)    Physical Exam Vitals reviewed.  Constitutional:      Appearance: He is not ill-appearing.  HENT:     Right Ear: Hearing, tympanic membrane, ear canal and external ear normal.     Left Ear: No swelling or tenderness. A middle ear effusion is present. There is no impacted cerumen. No foreign body. Tympanic membrane is retracted. Tympanic  membrane is not injected, perforated, erythematous or bulging.     Mouth/Throat:     Mouth: Mucous membranes are moist.     Pharynx: Posterior oropharyngeal erythema present. No pharyngeal swelling, oropharyngeal exudate or uvula swelling.     Tonsils: No tonsillar exudate or tonsillar abscesses.  Eyes:     General: No scleral icterus. Cardiovascular:     Rate and Rhythm: Normal rate and regular rhythm.     Heart sounds: No murmur heard. Pulmonary:     Effort: Pulmonary effort is normal.     Breath sounds: No stridor. No wheezing, rhonchi or rales.  Abdominal:     General: Abdomen is protuberant. There is no distension.     Palpations: There is no mass.     Tenderness: There is no abdominal tenderness. There is no guarding.     Hernia: No hernia is present.  Musculoskeletal:        General: Normal range of motion.     Cervical back: Neck supple. No rigidity.     Right lower leg: No edema.     Left lower leg: No edema.  Lymphadenopathy:     Cervical: No cervical adenopathy.  Skin:    General: Skin is warm and dry.     Findings: No rash.  Neurological:     General: No focal deficit present.     Mental Status: He is alert. Mental status is at baseline.     Lab Results  Component Value Date   WBC 9.5 09/05/2021   HGB 14.4 09/05/2021   HCT 43.2 09/05/2021   PLT 247 09/05/2021   GLUCOSE 263 (H) 01/03/2022   CHOL 144 01/03/2022   TRIG 167.0 (H) 01/03/2022   HDL 40.20 01/03/2022   LDLDIRECT 53.0 08/29/2017   LDLCALC 70 01/03/2022   ALT 17 01/03/2022   AST 13 01/03/2022   NA 137 01/03/2022   K 3.6 01/03/2022   CL 101 01/03/2022   CREATININE 0.94 01/03/2022   BUN 12 01/03/2022   CO2 30 01/03/2022   TSH 2.115 09/05/2021   PSA 0.54 05/03/2021   HGBA1C 8.3 (H) 01/03/2022   MICROALBUR 0.8 05/04/2021    DG ESOPHAGUS W DOUBLE CM (HD)  Result Date: 10/16/2021 CLINICAL DATA:  51 year old male. History of GERD. Now with generalized intermittent generalized upper abdominal  pain. Patient presents for double esophogram. EXAM: ESOPHAGUS/BARIUM SWALLOW/TABLET STUDY TECHNIQUE: Combined double and single contrast examination was performed using effervescent crystals, high-density barium, and thin liquid barium. This exam was performed by Anders Grant NP, and was supervised and interpreted by Dr. Leanna Battles. FLUOROSCOPY: Radiation Exposure Index (as provided by the fluoroscopic device): 21.50 mGy Kerma COMPARISON:  None Available. FINDINGS: Swallowing: Appears normal. No vestibular penetration or aspiration seen. Pharynx: Unremarkable. Esophagus: Normal appearance. Esophageal motility:  Within normal limits. Hiatal Hernia: None. Gastroesophageal reflux: None visualized. Ingested 71mm barium tablet: Passed normally Other: None. IMPRESSION: Negative examination. No abnormality seen to explain the current symptoms. No evidence of stricture or dysmotility. Electronically Signed   By: Leanna Battles M.D.   On: 10/16/2021 10:47    Assessment & Plan:   Axle was seen today for sore throat.  Diagnoses and all orders for this visit:  Non-recurrent acute serous otitis media of left ear- Will treat with augmentin and will offer symptom relief. -     amoxicillin-clavulanate (AUGMENTIN) 875-125 MG tablet; Take 1 tablet by mouth 2 (two) times daily for 10 days. -     Discontinue: HYDROcodone-acetaminophen (NORCO/VICODIN) 5-325 MG tablet; Take 1 tablet by mouth every 6 (six) hours as needed for up to 5 days for moderate pain. -     HYDROcodone-acetaminophen (NORCO/VICODIN) 5-325 MG tablet; Take 1 tablet by mouth every 6 (six) hours as needed for up to 5 days for moderate pain.   I am having Gregory Owens "[name]" start on amoxicillin-clavulanate. I am also having him maintain his cetirizine, Probiotic Product (ALIGN PO), glucose blood, famotidine, cyclobenzaprine, fluticasone, FreeStyle Libre Reader, ketoconazole, losartan, FreeStyle Libre Sensor System, venlafaxine XR,  RABEprazole, hyoscyamine, rosuvastatin, solifenacin, metFORMIN, tirzepatide, and HYDROcodone-acetaminophen.  Meds ordered this encounter  Medications   amoxicillin-clavulanate (AUGMENTIN) 875-125 MG tablet    Sig: Take 1 tablet by mouth 2 (two) times daily for 10 days.    Dispense:  20 tablet    Refill:  0   DISCONTD: HYDROcodone-acetaminophen (NORCO/VICODIN) 5-325 MG tablet    Sig: Take 1 tablet by mouth every 6 (six) hours as needed for up to 5 days for moderate pain.    Dispense:  20 tablet    Refill:  0   HYDROcodone-acetaminophen (NORCO/VICODIN) 5-325 MG tablet    Sig: Take 1 tablet by mouth every 6 (six) hours as needed for up to 5 days for moderate pain.    Dispense:  20 tablet    Refill:  0     Follow-up: Return if symptoms worsen or fail to improve.  Sanda Linger, MD

## 2022-01-31 ENCOUNTER — Ambulatory Visit: Payer: Self-pay

## 2022-01-31 ENCOUNTER — Ambulatory Visit (INDEPENDENT_AMBULATORY_CARE_PROVIDER_SITE_OTHER): Payer: No Typology Code available for payment source | Admitting: Family Medicine

## 2022-01-31 VITALS — BP 122/78 | HR 102 | Ht 68.0 in | Wt 263.0 lb

## 2022-01-31 DIAGNOSIS — M25561 Pain in right knee: Secondary | ICD-10-CM | POA: Diagnosis not present

## 2022-01-31 DIAGNOSIS — G8929 Other chronic pain: Secondary | ICD-10-CM

## 2022-01-31 NOTE — Patient Instructions (Addendum)
Thank you for coming in today.   You received an injection today. Seek immediate medical attention if the joint becomes red, extremely painful, or is oozing fluid.   I've referred you to Physical Therapy.  Let us know if you don't hear from them in one week.   Recheck in 6 weeks.    

## 2022-01-31 NOTE — Progress Notes (Signed)
I, Peterson Lombard, LAT, ATC acting as a scribe for Lynne Leader, MD.  Subjective:    CC: R knee pain  HPI: Pt is a 52 y/o male c/o R knee pain ongoing for about 1+ months. Pt recalls doing a lot of walking downtown and then noticed R knee pain. Pt locates pain to the whole R knee.   R Knee swelling: yes- slight, lateral joint line Mechanical symptoms: yes Radiates: yes Aggravates: prolonged sitting, extensive walking, stairs Treatments tried: IBU, Tylenol, Voltaren gel  Dx imaging: 01/03/22 R knee XR  Pertinent review of Systems: No fevers or chills  Relevant historical information: Diabetes   Objective:    Vitals:   01/31/22 1357  BP: 122/78  Pulse: (!) 102  SpO2: 96%   General: Well Developed, well nourished, and in no acute distress.   MSK: Right knee effusion normal-appearing otherwise.  Normal motion with crepitation.  Tender palpation medial joint line. Stable ligamentous exam.  Intact strength.  Lab and Radiology Results  Procedure: Real-time Ultrasound Guided Injection of right knee superior lateral patellar space Device: Philips Affiniti 50G Images permanently stored and available for review in PACS Verbal informed consent obtained.  Discussed risks and benefits of procedure. Warned about infection, bleeding, hyperglycemia damage to structures among others. Patient expresses understanding and agreement Time-out conducted.   Noted no overlying erythema, induration, or other signs of local infection.   Skin prepped in a sterile fashion.   Local anesthesia: Topical Ethyl chloride.   With sterile technique and under real time ultrasound guidance: 40 mg of Kenalog and 2 mL of Marcaine injected into knee joint. Fluid seen entering the joint capsule.   Completed without difficulty   Pain immediately resolved suggesting accurate placement of the medication.   Advised to call if fevers/chills, erythema, induration, drainage, or persistent bleeding.   Images  permanently stored and available for review in the ultrasound unit.  Impression: Technically successful ultrasound guided injection.     EXAM: RIGHT KNEE - COMPLETE 4+ VIEW   COMPARISON:  None Available.   FINDINGS: Minimal chronic enthesopathic change at the patellar insertion on the tibial tubercle. Mild chronic enthesopathic change at the quadriceps insertion on the superior patella. Minimal superior patellar degenerative spur. No joint effusion. No acute fracture or dislocation.   IMPRESSION: Minimal superior patellar degenerative spur.   Mild chronic enthesopathic change at the quadriceps insertion on the patella.     Electronically Signed   By: Yvonne Kendall M.D.   On: 01/04/2022 10:04 I, Lynne Leader, personally (independently) visualized and performed the interpretation of the images attached in this note.    Impression and Recommendations:    Assessment and Plan: 52 y.o. male with right knee pain thought to be due to exacerbation of DJD.  Plan for steroid injection.  Additionally refer to physical therapy for quad strengthening.  Recommend also Voltaren gel.  Recheck in about 6 weeks.  Return sooner if needed.[name]  PDMP not reviewed this encounter. Orders Placed This Encounter  Procedures   [place] LIMITED JOINT SPACE STRUCTURES LOW RIGHT(NO LINKED CHARGES)    Order Specific Question:   Reason for Exam (SYMPTOM  OR DIAGNOSIS REQUIRED)    Answer:   right knee pain    Order Specific Question:   Preferred imaging location?    Answer:   [place]   Ambulatory referral to Physical Therapy    Referral Priority:   Routine    Referral Type:   Physical Medicine  Referral Reason:   Specialty Services Required    Requested Specialty:   Physical Therapy    Number of Visits Requested:   1   No orders of the defined types were placed in this encounter.   Discussed warning signs or symptoms. Please see discharge instructions. Patient expresses  understanding.   The above documentation has been reviewed and is accurate and complete Lynne Leader, M.D.

## 2022-02-01 ENCOUNTER — Other Ambulatory Visit: Payer: Self-pay | Admitting: Internal Medicine

## 2022-02-01 ENCOUNTER — Other Ambulatory Visit: Payer: Self-pay

## 2022-02-01 ENCOUNTER — Encounter: Payer: Self-pay | Admitting: Internal Medicine

## 2022-02-01 MED ORDER — KETOCONAZOLE 2 % EX SHAM: 1.0000 | MEDICATED_SHAMPOO | CUTANEOUS | 6 refills | Status: DC

## 2022-02-01 NOTE — Telephone Encounter (Signed)
Rx request sent to pharmacy, patient informed 

## 2022-02-02 NOTE — Telephone Encounter (Signed)
Please refill as per office routine med refill policy (all routine meds to be refilled for 3 mo or monthly (per pt preference) up to one year from last visit, then month to month grace period for 3 mo, then further med refills will have to be denied) ? ?

## 2022-02-03 ENCOUNTER — Other Ambulatory Visit: Payer: Self-pay | Admitting: Nurse Practitioner

## 2022-02-03 DIAGNOSIS — K219 Gastro-esophageal reflux disease without esophagitis: Secondary | ICD-10-CM

## 2022-02-05 ENCOUNTER — Encounter: Payer: Self-pay | Admitting: Internal Medicine

## 2022-02-05 NOTE — Telephone Encounter (Signed)
Please advise. Pt's last visit was 11/27/21.

## 2022-03-13 NOTE — Progress Notes (Deleted)
   I, Peterson Lombard, LAT, ATC acting as a scribe for Lynne Leader, MD.  Gregory Owens is a 51 y.o. male who presents to [facility] today for 6-wk f/u chronic R knee pain. Pt was last seen by Dr. Georgina Snell on 01/31/22 and was given a R knee steroid injection, advised to use Voltaren gel, and was referred to PT, but never scheduled any visits. Today, pt reports  Dx imaging: 01/03/22 R knee XR   Pertinent review of systems: ***  Relevant historical information: ***   Exam:  There were no vitals taken for this visit. General: Well Developed, well nourished, and in no acute distress.   MSK: ***    Lab and Radiology Results No results found for this or any previous visit (from the past 72 hour(s)). No results found.     Assessment and Plan: 52 y.o. male with ***   PDMP not reviewed this encounter. No orders of the defined types were placed in this encounter.  No orders of the defined types were placed in this encounter.    Discussed warning signs or symptoms. Please see discharge instructions. Patient expresses understanding.   ***

## 2022-03-14 ENCOUNTER — Ambulatory Visit: Payer: No Typology Code available for payment source | Admitting: Family Medicine

## 2022-03-24 ENCOUNTER — Other Ambulatory Visit: Payer: Self-pay | Admitting: Gastroenterology

## 2022-05-02 ENCOUNTER — Ambulatory Visit (INDEPENDENT_AMBULATORY_CARE_PROVIDER_SITE_OTHER): Payer: No Typology Code available for payment source | Admitting: Family Medicine

## 2022-05-02 ENCOUNTER — Other Ambulatory Visit: Payer: Self-pay

## 2022-05-02 VITALS — BP 138/84 | HR 90 | Ht 68.0 in | Wt 258.0 lb

## 2022-05-02 DIAGNOSIS — G8929 Other chronic pain: Secondary | ICD-10-CM | POA: Diagnosis not present

## 2022-05-02 DIAGNOSIS — M25561 Pain in right knee: Secondary | ICD-10-CM

## 2022-05-02 NOTE — Progress Notes (Signed)
Shirlyn Goltz, PhD, LAT, ATC acting as a scribe for Lynne Leader, MD.   Christena Deem Sahr is a 52 y.o. male who presents to [place] at [facility] today for cont'd chronic R knee pain. Pt was last seen by Dr. Georgina Snell on 01/31/22 and was given a R knee steroid injection, advised to use Voltaren gel, and was referred to PT, but never scheduled any visits. Pt no-showed his 6-wk f/u visit on 2/14. Today, pt reports R knee pain returned about 3-wks ago.   Dx imaging: 01/03/22 R knee XR    Pertinent review of systems: No fevers or chills  Relevant historical information: Sleep apnea.  Diabetes.   Exam:  BP 138/84   Pulse 90   Ht 5\' 8"  (1.727 m)   Wt 258 lb (117 kg)   SpO2 97%   BMI 39.23 kg/m  General: Well Developed, well nourished, and in no acute distress.   MSK: Right knee mild effusion otherwise normal-appearing normal motion.    Lab and Radiology Results  Procedure: Real-time Ultrasound Guided Injection of right knee superior lateral patellar space Device: Philips Affiniti 50G Images permanently stored and available for review in PACS Verbal informed consent obtained.  Discussed risks and benefits of procedure. Warned about infection, bleeding, hyperglycemia damage to structures among others. Patient expresses understanding and agreement Time-out conducted.   Noted no overlying erythema, induration, or other signs of local infection.   Skin prepped in a sterile fashion.   Local anesthesia: Topical Ethyl chloride.   With sterile technique and under real time ultrasound guidance: 40 mg of Kenalog and 2 mL Marcaine injected into knee joint. Fluid seen entering the joint capsule.   Completed without difficulty   Pain immediately resolved suggesting accurate placement of the medication.   Advised to call if fevers/chills, erythema, induration, drainage, or persistent bleeding.   Images permanently stored and available for review in the ultrasound  unit.  Impression: Technically successful ultrasound guided injection.   EXAM: RIGHT KNEE - COMPLETE 4+ VIEW   COMPARISON:  None Available.   FINDINGS: Minimal chronic enthesopathic change at the patellar insertion on the tibial tubercle. Mild chronic enthesopathic change at the quadriceps insertion on the superior patella. Minimal superior patellar degenerative spur. No joint effusion. No acute fracture or dislocation.   IMPRESSION: Minimal superior patellar degenerative spur.   Mild chronic enthesopathic change at the quadriceps insertion on the patella.     Electronically Signed   By: Yvonne Kendall M.D.   On: 01/04/2022 10:04   I, Lynne Leader, personally (independently) visualized and performed the interpretation of the images attached in this note.'   Assessment and Plan: 52 y.o. male with right knee pain thought to be due to exacerbation of DJD.  He may have a meniscus degenerative tear as well.  Plan for steroid injection today.  Recheck back in 3 months.  Work on [employer].  Recommend Voltaren gel.  Recommend avoiding high-dose oral NSAIDs.   PDMP not reviewed this encounter. Orders Placed This Encounter  Procedures   [place] LIMITED JOINT SPACE STRUCTURES LOW RIGHT(NO LINKED CHARGES)    Order Specific Question:   Reason for Exam (SYMPTOM  OR DIAGNOSIS REQUIRED)    Answer:   right knee pain    Order Specific Question:   Preferred imaging location?    Answer:   [place]   No orders of the defined types were placed in this encounter.    Discussed warning signs  or symptoms. Please see discharge instructions. Patient expresses understanding.   The above documentation has been reviewed and is accurate and complete Lynne Leader, M.D.

## 2022-05-02 NOTE — Patient Instructions (Addendum)
Thank you for coming in today.   You received an injection today. Seek immediate medical attention if the joint becomes red, extremely painful, or is oozing fluid.   I'd be happy to see you mom about her hands  Check back in 3 months

## 2022-05-10 ENCOUNTER — Telehealth: Payer: No Typology Code available for payment source | Admitting: Physician Assistant

## 2022-05-10 DIAGNOSIS — J208 Acute bronchitis due to other specified organisms: Secondary | ICD-10-CM

## 2022-05-10 DIAGNOSIS — B9689 Other specified bacterial agents as the cause of diseases classified elsewhere: Secondary | ICD-10-CM

## 2022-05-10 MED ORDER — BENZONATATE 100 MG PO CAPS: 100.0000 mg | ORAL_CAPSULE | Freq: Three times a day (TID) | ORAL | 0 refills | Status: DC | PRN

## 2022-05-10 MED ORDER — DOXYCYCLINE HYCLATE 100 MG PO TABS: 100.0000 mg | ORAL_TABLET | Freq: Two times a day (BID) | ORAL | 0 refills | Status: DC

## 2022-05-10 MED ORDER — ALBUTEROL SULFATE HFA 108 (90 BASE) MCG/ACT IN AERS: 2.0000 | INHALATION_SPRAY | Freq: Four times a day (QID) | RESPIRATORY_TRACT | 0 refills | Status: DC | PRN

## 2022-05-10 NOTE — Patient Instructions (Signed)
Gregory Owens, thank you for joining [name], PA-C for today's virtual visit.  While this provider is not your primary care provider (PCP), if your PCP is located in our provider database this encounter information will be shared with them immediately following your visit.   A [facility] MyChart account gives you access to today's visit and all your visits, tests, and labs performed at [facility] " click here if you don't have a [facility] MyChart account or go to [email or web address]  Consent: (Patient) Gregory Owens provided verbal consent for this virtual visit at the beginning of the encounter.  Current Medications:  Current Outpatient Medications:    cetirizine (ZYRTEC) 10 MG tablet, Take 10 mg by mouth daily., Disp: , Rfl:    Continuous Blood Gluc Receiver (FREESTYLE LIBRE READER) DEVI, Use as directed daily continuos E11.9, Disp: 1 each, Rfl: 0   Continuous Blood Gluc Sensor (FREESTYLE LIBRE SENSOR SYSTEM) MISC, Use as directed continous daily every 14 days E11.9, Disp: 6 each, Rfl: 3   cyclobenzaprine (FLEXERIL) 5 MG tablet, Take 1 tablet (5 mg total) by mouth 3 (three) times daily as needed for muscle spasms., Disp: 30 tablet, Rfl: 1   famotidine (PEPCID) 20 MG tablet, Take 1 tablet (20 mg total) by mouth at bedtime., Disp: 90 tablet, Rfl: 3   fluticasone (FLONASE) 50 MCG/ACT nasal spray, Place 2 sprays into both nostrils daily., Disp: 16 g, Rfl: 6   glucose blood test strip, Use as instructed twice a day E 11.9 Contour Next Strips, Disp: 100 each, Rfl: 2   hyoscyamine (LEVSIN SL) 0.125 MG SL tablet, DISSOLVE 1 TABLET UNDER THE TONGUE EVERY 8 HOURS AS NEEDED FOR ESOPHAGEAL PAIN OR ABDOMINAL PAIN, Disp: 90 tablet, Rfl: 3   ketoconazole (NIZORAL) 2 % shampoo, Apply 1 Application topically 2 (two) times a week., Disp: 120 mL, Rfl: 6   losartan (COZAAR) 25 MG tablet, TAKE 1 TABLET BY MOUTH DAILY, Disp: 90 tablet, Rfl: 3   metFORMIN  (GLUCOPHAGE-XR) 500 MG 24 hr tablet, TAKE 4 TABLETS BY MOUTH ONCE  DAILY WITH BREAKFAST, Disp: 360 tablet, Rfl: 3   Probiotic Product (ALIGN PO), Take 1 capsule by mouth daily. , Disp: , Rfl:    RABEprazole (ACIPHEX) 20 MG tablet, TAKE 1 TABLET(20 MG) BY MOUTH TWICE DAILY, Disp: 60 tablet, Rfl: 1   rosuvastatin (CRESTOR) 10 MG tablet, TAKE 1 TABLET BY MOUTH DAILY, Disp: 90 tablet, Rfl: 1   solifenacin (VESICARE) 5 MG tablet, TAKE 1 TABLET BY MOUTH DAILY, Disp: 90 tablet, Rfl: 1   tirzepatide (MOUNJARO) 2.5 MG/0.5ML Pen, Inject 2.5 mg into the skin once a week., Disp: 6 mL, Rfl: 3   venlafaxine XR (EFFEXOR-XR) 75 MG 24 hr capsule, Take 225 mg by mouth daily., Disp: , Rfl:    Medications ordered in this encounter:  No orders of the defined types were placed in this encounter.    *If you need refills on other medications prior to your next appointment, please contact your pharmacy*  Follow-Up: Call back or seek an in-person evaluation if the symptoms worsen or if the condition fails to improve as anticipated.  [facility] [phone]  Other Instructions Take antibiotic (Doxycycline) as directed.  Increase fluids.  Get plenty of rest. Use Mucinex for congestion. Use other prescribed medications as directed. Take a daily probiotic (I recommend Align or Culturelle, but even Activia Yogurt may be beneficial).  A humidifier placed in the bedroom may offer some relief for  a dry, scratchy throat of nasal irritation.  Read information below on acute bronchitis. Please call or return to clinic if symptoms are not improving.  Acute Bronchitis Bronchitis is when the airways that extend from the windpipe into the lungs get red, puffy, and painful (inflamed). Bronchitis often causes thick spit (mucus) to develop. This leads to a cough. A cough is the most common symptom of bronchitis. In acute bronchitis, the condition usually begins suddenly and goes away over time (usually in 2 weeks).  Smoking, allergies, and asthma can make bronchitis worse. Repeated episodes of bronchitis may cause more lung problems.  HOME CARE Rest. Drink enough fluids to keep your pee (urine) clear or pale yellow (unless you need to limit fluids as told by your doctor). Only take over-the-counter or prescription medicines as told by your doctor. Avoid smoking and secondhand smoke. These can make bronchitis worse. If you are a smoker, think about using nicotine gum or skin patches. Quitting smoking will help your lungs heal faster. Reduce the chance of getting bronchitis again by: Washing your hands often. Avoiding people with cold symptoms. Trying not to touch your hands to your mouth, nose, or eyes. Follow up with your doctor as told.  GET HELP IF: Your symptoms do not improve after 1 week of treatment. Symptoms include: Cough. Fever. Coughing up thick spit. Body aches. Chest congestion. Chills. Shortness of breath. Sore throat.  GET HELP RIGHT AWAY IF:  You have an increased fever. You have chills. You have severe shortness of breath. You have bloody thick spit (sputum). You throw up (vomit) often. You lose too much body fluid (dehydration). You have a severe headache. You faint.  MAKE SURE YOU:  Understand these instructions. Will watch your condition. Will get help right away if you are not doing well or get worse. Document Released: 07/04/2007 Document Revised: 09/17/2012 Document Reviewed: 07/08/2012 [facility] Patient Information 2015 [place], [place]. This information is not intended to replace advice given to you by your health care provider. Make sure you discuss any questions you have with your health care provider.    If you have been instructed to have an in-person evaluation today at a local Urgent Care facility, please use the link below. It will take you to a list of all of our available [facility], including address, phone number and hours of operation.  Please do not delay care.  [facility]  If you or a family member do not have a primary care provider, use the link below to schedule a visit and establish care. When you choose a [facility] primary care physician or advanced practice provider, you gain a long-term partner in health. Find a Primary Care Provider  Learn more about [facility]'s in-office and virtual care options: [facility] - Get Care Now

## 2022-05-10 NOTE — Progress Notes (Signed)
Virtual Visit Consent   Gregory Owens, you are scheduled for a virtual visit with a [facility] provider today. Just as with appointments in the office, your consent must be obtained to participate. Your consent will be active for this visit and any virtual visit you may have with one of our providers in the next 365 days. If you have a MyChart account, a copy of this consent can be sent to you electronically.  As this is a virtual visit, video technology does not allow for your provider to perform a traditional examination. This may limit your provider's ability to fully assess your condition. If your provider identifies any concerns that need to be evaluated in person or the need to arrange testing (such as labs, EKG, etc.), we will make arrangements to do so. Although advances in technology are sophisticated, we cannot ensure that it will always work on either your end or our end. If the connection with a video visit is poor, the visit may have to be switched to a telephone visit. With either a video or telephone visit, we are not always able to ensure that we have a secure connection.  By engaging in this virtual visit, you consent to the provision of healthcare and authorize for your insurance to be billed (if applicable) for the services provided during this visit. Depending on your insurance coverage, you may receive a charge related to this service.  I need to obtain your verbal consent now. Are you willing to proceed with your visit today? Gregory Owens has provided verbal consent on 05/10/2022 for a virtual visit (video or telephone). [name], [place]  Date: 05/10/2022 5:30 PM  Virtual Visit via Video Note   I, [name], connected with  Gregory Owens  ([id number], 02-14-1970) on 05/10/22 at  5:30 PM EDT by a video-enabled telemedicine application and verified that I am speaking with the correct person using two  identifiers.  Location: Patient: Virtual Visit Location Patient: Home Provider: Virtual Visit Location Provider: Home Office   I discussed the limitations of evaluation and management by telemedicine and the availability of in person appointments. The patient expressed understanding and agreed to proceed.    History of Present Illness: Gregory Owens is a 52 y.o. who identifies as a male who was assigned male at birth, and is being seen today for chest congestion and cough that has now become productive of thick, colored phlegm, now with fever, chills and chest tightness. Denies chest pain or SOB. Denies sinus pain.  Daughter was sick last week but is better now. Denies recent travel or other known sick contact.  OTC -- Alka Seltzer  HPI: HPI  Problems:  Patient Active Problem List   Diagnosis Date Noted   Non-recurrent acute serous otitis media of left ear 01/25/2022   Right knee pain 01/06/2022   Abdominal pain, generalized 08/02/2021   Abnormal LFTs 02/17/2021   Abnormal serum lipase level 02/17/2021   Dark urine 12/16/2020   Body mass index (BMI) 38.0-38.9, adult 03/29/2020   Hyperglycemia due to type 2 diabetes mellitus 03/29/2020   Metabolic syndrome 03/29/2020   Nonalcoholic steatohepatitis (NASH) 03/29/2020   Vitamin D deficiency 03/29/2020   Low back pain 03/29/2020   Right flank pain, chronic 02/08/2020   Right lower quadrant abdominal pain 12/20/2019   Hematochezia 07/09/2019   Cholecystitis 06/07/2019   S/P laparoscopic cholecystectomy 06/07/2019   Headache 02/08/2019   Urinary urgency 11/12/2018   Hypogonadism in male 09/04/2017  Bilateral foot pain 03/08/2017   Dysphagia 08/29/2016   ADHD 03/28/2016   Morbid obesity 12/06/2015   Achrochordon 10/11/2015   Seborrheic dermatitis 10/11/2015   Encounter for well adult exam with abnormal findings 03/10/2015   Skin lesion 03/10/2015   GERD (gastroesophageal reflux disease) 11/04/2013   Tachycardia  11/04/2013   Nonspecific abnormal electrocardiogram (ECG) (EKG) 11/04/2013   Non-compliant behavior 10/14/2013   Diabetes 08/19/2012   PLMD (periodic limb movement disorder) 05/30/2012   SLEEP DISORDER 12/05/2009   Hyperlipidemia 08/25/2009   GAD (generalized anxiety disorder) 08/25/2009   Obstructive sleep apnea 08/25/2009   RHINITIS 05/24/2009    Allergies:  Allergies  Allergen Reactions   Clomiphene Citrate     Other reaction(s): LFT elevation of  AST/ALT into 500's   Sulfa Antibiotics Other (See Comments)    Patient is not sure what reaction.    Medications:  Current Outpatient Medications:    cetirizine (ZYRTEC) 10 MG tablet, Take 10 mg by mouth daily., Disp: , Rfl:    Continuous Blood Gluc Receiver (FREESTYLE LIBRE READER) DEVI, Use as directed daily continuos E11.9, Disp: 1 each, Rfl: 0   Continuous Blood Gluc Sensor (FREESTYLE LIBRE SENSOR SYSTEM) MISC, Use as directed continous daily every 14 days E11.9, Disp: 6 each, Rfl: 3   cyclobenzaprine (FLEXERIL) 5 MG tablet, Take 1 tablet (5 mg total) by mouth 3 (three) times daily as needed for muscle spasms., Disp: 30 tablet, Rfl: 1   famotidine (PEPCID) 20 MG tablet, Take 1 tablet (20 mg total) by mouth at bedtime., Disp: 90 tablet, Rfl: 3   fluticasone (FLONASE) 50 MCG/ACT nasal spray, Place 2 sprays into both nostrils daily., Disp: 16 g, Rfl: 6   glucose blood test strip, Use as instructed twice a day E 11.9 Contour Next Strips, Disp: 100 each, Rfl: 2   hyoscyamine (LEVSIN SL) 0.125 MG SL tablet, DISSOLVE 1 TABLET UNDER THE TONGUE EVERY 8 HOURS AS NEEDED FOR ESOPHAGEAL PAIN OR ABDOMINAL PAIN, Disp: 90 tablet, Rfl: 3   ketoconazole (NIZORAL) 2 % shampoo, Apply 1 Application topically 2 (two) times a week., Disp: 120 mL, Rfl: 6   losartan (COZAAR) 25 MG tablet, TAKE 1 TABLET BY MOUTH DAILY, Disp: 90 tablet, Rfl: 3   metFORMIN (GLUCOPHAGE-XR) 500 MG 24 hr tablet, TAKE 4 TABLETS BY MOUTH ONCE  DAILY WITH BREAKFAST, Disp: 360 tablet,  Rfl: 3   Probiotic Product (ALIGN PO), Take 1 capsule by mouth daily. , Disp: , Rfl:    RABEprazole (ACIPHEX) 20 MG tablet, TAKE 1 TABLET(20 MG) BY MOUTH TWICE DAILY, Disp: 60 tablet, Rfl: 1   rosuvastatin (CRESTOR) 10 MG tablet, TAKE 1 TABLET BY MOUTH DAILY, Disp: 90 tablet, Rfl: 1   solifenacin (VESICARE) 5 MG tablet, TAKE 1 TABLET BY MOUTH DAILY, Disp: 90 tablet, Rfl: 1   tirzepatide (MOUNJARO) 2.5 MG/0.5ML Pen, Inject 2.5 mg into the skin once a week., Disp: 6 mL, Rfl: 3   venlafaxine XR (EFFEXOR-XR) 75 MG 24 hr capsule, Take 225 mg by mouth daily., Disp: , Rfl:   Observations/Objective: Patient is well-developed, well-nourished in no acute distress.  Resting comfortably in parked car.  Head is normocephalic, atraumatic.  No labored breathing. Speech is clear and coherent with logical content.  Patient is alert and oriented at baseline.   Assessment and Plan: 1. Acute bacterial bronchitis  Rx Doxycycline.  Increase fluids.  Rest.  Saline nasal spray.  Probiotic.  Mucinex as directed.  Humidifier in bedroom. Tessalon per orders. Albuterol per orders.  Call or return to clinic if symptoms are not improving.   Follow Up Instructions: I discussed the assessment and treatment plan with the patient. The patient was provided an opportunity to ask questions and all were answered. The patient agreed with the plan and demonstrated an understanding of the instructions.  A copy of instructions were sent to the patient via MyChart unless otherwise noted below.   The patient was advised to call back or seek an in-person evaluation if the symptoms worsen or if the condition fails to improve as anticipated.  Time:  I spent 10 minutes with the patient via telehealth technology discussing the above problems/concerns.    [name], PA-C

## 2022-05-11 MED ORDER — BENZONATATE 100 MG PO CAPS: 100.0000 mg | ORAL_CAPSULE | Freq: Three times a day (TID) | ORAL | 0 refills | Status: DC | PRN

## 2022-05-11 MED ORDER — DOXYCYCLINE HYCLATE 100 MG PO TABS: 100.0000 mg | ORAL_TABLET | Freq: Two times a day (BID) | ORAL | 0 refills | Status: DC

## 2022-05-11 MED ORDER — ALBUTEROL SULFATE HFA 108 (90 BASE) MCG/ACT IN AERS: 2.0000 | INHALATION_SPRAY | Freq: Four times a day (QID) | RESPIRATORY_TRACT | 0 refills | Status: DC | PRN

## 2022-05-11 NOTE — Addendum Note (Signed)
Addended by: Margaretann Loveless on: 05/11/2022 05:40 PM   Modules accepted: Orders

## 2022-06-13 ENCOUNTER — Other Ambulatory Visit: Payer: Self-pay | Admitting: Internal Medicine

## 2022-06-14 ENCOUNTER — Other Ambulatory Visit: Payer: Self-pay

## 2022-06-19 ENCOUNTER — Other Ambulatory Visit: Payer: Self-pay | Admitting: *Deleted

## 2022-06-19 MED ORDER — HYOSCYAMINE SULFATE 0.125 MG SL SUBL: 0.1250 mg | SUBLINGUAL_TABLET | Freq: Three times a day (TID) | SUBLINGUAL | 1 refills | Status: DC | PRN

## 2022-06-28 ENCOUNTER — Encounter: Payer: Self-pay | Admitting: Internal Medicine

## 2022-06-29 ENCOUNTER — Ambulatory Visit (INDEPENDENT_AMBULATORY_CARE_PROVIDER_SITE_OTHER): Payer: No Typology Code available for payment source | Admitting: Internal Medicine

## 2022-06-29 ENCOUNTER — Encounter: Payer: Self-pay | Admitting: Gastroenterology

## 2022-06-29 ENCOUNTER — Encounter: Payer: Self-pay | Admitting: Internal Medicine

## 2022-06-29 VITALS — BP 128/80 | HR 98 | Temp 98.7°F | Ht 68.0 in | Wt 262.0 lb

## 2022-06-29 DIAGNOSIS — E559 Vitamin D deficiency, unspecified: Secondary | ICD-10-CM

## 2022-06-29 DIAGNOSIS — E538 Deficiency of other specified B group vitamins: Secondary | ICD-10-CM | POA: Diagnosis not present

## 2022-06-29 DIAGNOSIS — Z125 Encounter for screening for malignant neoplasm of prostate: Secondary | ICD-10-CM | POA: Diagnosis not present

## 2022-06-29 DIAGNOSIS — R5383 Other fatigue: Secondary | ICD-10-CM | POA: Diagnosis not present

## 2022-06-29 DIAGNOSIS — Z7984 Long term (current) use of oral hypoglycemic drugs: Secondary | ICD-10-CM

## 2022-06-29 DIAGNOSIS — E7800 Pure hypercholesterolemia, unspecified: Secondary | ICD-10-CM

## 2022-06-29 DIAGNOSIS — E1165 Type 2 diabetes mellitus with hyperglycemia: Secondary | ICD-10-CM

## 2022-06-29 DIAGNOSIS — Z0001 Encounter for general adult medical examination with abnormal findings: Secondary | ICD-10-CM | POA: Diagnosis not present

## 2022-06-29 DIAGNOSIS — Z86010 Personal history of colonic polyps: Secondary | ICD-10-CM

## 2022-06-29 DIAGNOSIS — F909 Attention-deficit hyperactivity disorder, unspecified type: Secondary | ICD-10-CM

## 2022-06-29 DIAGNOSIS — Z7985 Long-term (current) use of injectable non-insulin antidiabetic drugs: Secondary | ICD-10-CM

## 2022-06-29 LAB — HEPATIC FUNCTION PANEL
ALT: 9 U/L (ref 0–53)
AST: 11 U/L (ref 0–37)
Albumin: 3.6 g/dL (ref 3.5–5.2)
Alkaline Phosphatase: 65 U/L (ref 39–117)
Bilirubin, Direct: 0.1 mg/dL (ref 0.0–0.3)
Total Bilirubin: 0.4 mg/dL (ref 0.2–1.2)
Total Protein: 6.7 g/dL (ref 6.0–8.3)

## 2022-06-29 LAB — BASIC METABOLIC PANEL
BUN: 12 mg/dL (ref 6–23)
CO2: 30 mEq/L (ref 19–32)
Calcium: 8.5 mg/dL (ref 8.4–10.5)
Chloride: 100 mEq/L (ref 96–112)
Creatinine, Ser: 0.94 mg/dL (ref 0.40–1.50)
GFR: 92.86 mL/min (ref >60.00)
Glucose, Bld: 255 mg/dL — ABNORMAL HIGH (ref 70–99)
Potassium: 3.7 mEq/L (ref 3.5–5.1)
Sodium: 138 mEq/L (ref 135–145)

## 2022-06-29 LAB — CBC WITH DIFFERENTIAL/PLATELET
Basophils Absolute: 0 10*3/uL (ref 0.0–0.1)
Basophils Relative: 0.4 % (ref 0.0–3.0)
Eosinophils Absolute: 0.1 10*3/uL (ref 0.0–0.7)
Eosinophils Relative: 1.6 % (ref 0.0–5.0)
HCT: 42.2 % (ref 39.0–52.0)
Hemoglobin: 13.9 g/dL (ref 13.0–17.0)
Lymphocytes Relative: 22.2 % (ref 12.0–46.0)
Lymphs Abs: 1.9 10*3/uL (ref 0.7–4.0)
MCHC: 33.1 g/dL (ref 30.0–36.0)
MCV: 93.8 fl (ref 78.0–100.0)
Monocytes Absolute: 0.4 10*3/uL (ref 0.1–1.0)
Monocytes Relative: 4.7 % (ref 3.0–12.0)
Neutro Abs: 6 10*3/uL (ref 1.4–7.7)
Neutrophils Relative %: 71.1 % (ref 43.0–77.0)
Platelets: 278 10*3/uL (ref 150.0–400.0)
RBC: 4.5 Mil/uL (ref 4.22–5.81)
RDW: 12.9 % (ref 11.5–15.5)
WBC: 8.4 10*3/uL (ref 4.0–10.5)

## 2022-06-29 LAB — LIPID PANEL
Cholesterol: 152 mg/dL (ref 0–200)
HDL: 41.8 mg/dL (ref >39.00)
LDL Cholesterol: 85 mg/dL (ref 0–99)
NonHDL: 109.74
Total CHOL/HDL Ratio: 4
Triglycerides: 125 mg/dL (ref 0.0–149.0)
VLDL: 25 mg/dL (ref 0.0–40.0)

## 2022-06-29 LAB — URINALYSIS, ROUTINE W REFLEX MICROSCOPIC
Bilirubin Urine: NEGATIVE
Hgb urine dipstick: NEGATIVE
Ketones, ur: NEGATIVE
Leukocytes,Ua: NEGATIVE
Nitrite: NEGATIVE
Specific Gravity, Urine: >=1.03 — AB (ref 1.000–1.030)
Total Protein, Urine: NEGATIVE
Urine Glucose: 250 — AB
Urobilinogen, UA: 0.2 (ref 0.0–1.0)
pH: 5.5 (ref 5.0–8.0)

## 2022-06-29 LAB — PSA: PSA: 0.63 ng/mL (ref 0.10–4.00)

## 2022-06-29 LAB — MICROALBUMIN / CREATININE URINE RATIO
Creatinine,U: 256 mg/dL
Microalb Creat Ratio: 0.7 mg/g (ref 0.0–30.0)
Microalb, Ur: 1.8 mg/dL (ref 0.0–1.9)

## 2022-06-29 LAB — TSH: TSH: 1.38 u[IU]/mL (ref 0.35–5.50)

## 2022-06-29 LAB — VITAMIN D 25 HYDROXY (VIT D DEFICIENCY, FRACTURES): VITD: 16.33 ng/mL — ABNORMAL LOW (ref 30.00–100.00)

## 2022-06-29 LAB — HEMOGLOBIN A1C: Hgb A1c MFr Bld: 8.1 % — ABNORMAL HIGH (ref 4.6–6.5)

## 2022-06-29 LAB — VITAMIN B12: Vitamin B-12: 185 pg/mL — ABNORMAL LOW (ref 211–911)

## 2022-06-29 LAB — TESTOSTERONE: Testosterone: 151.39 ng/dL — ABNORMAL LOW (ref 300.00–890.00)

## 2022-06-29 MED ORDER — TIRZEPATIDE 5 MG/0.5ML ~~LOC~~ SOAJ
5.0000 mg | SUBCUTANEOUS | 11 refills | Status: DC
Start: 2022-06-29 — End: 2022-07-06

## 2022-06-29 NOTE — Patient Instructions (Addendum)
Ok to increase the mounjaro to 5 mg  Please continue all other medications as before, and refills have been done if requested.  Please have the pharmacy call with any other refills you may need.  Please continue your efforts at being more active, low cholesterol diet, and weight control.  You are otherwise up to date with prevention measures today.  Please keep your appointments with your specialists as you may have planned - eye appt June 28  You will be contacted regarding the referral for: colonoscopy  Please go to the LAB at the blood drawing area for the tests to be done  You will be contacted by phone if any changes need to be made immediately.  Otherwise, you will receive a letter about your results with an explanation, but please check with MyChart first.  Please remember to sign up for MyChart if you have not done so, as this will be important to you in the future with finding out test results, communicating by private email, and scheduling acute appointments online when needed.  Please make an Appointment to return in 6 months, or sooner if needed

## 2022-06-29 NOTE — Assessment & Plan Note (Signed)
To being ADHD med soon per ADD clinic

## 2022-06-29 NOTE — Progress Notes (Unsigned)
Patient ID: Gregory Owens, male   DOB: 08-04-1970, 52 y.o.   MRN: [id number]         Chief Complaint:: wellness exam and labwork   ***       HPI:  Gregory Owens is a 52 y.o. male here for wellness exam; has eye appt for June 28,                         Also***   Wt Readings from Last 3 Encounters:  06/29/22 262 lb (118.8 kg)  05/02/22 258 lb (117 kg)  01/31/22 263 lb (119.3 kg)   BP Readings from Last 3 Encounters:  06/29/22 128/80  05/02/22 138/84  01/31/22 122/78   Immunization History  Administered Date(s) Administered   Influenza Split 12/12/2010   Influenza Whole 11/11/2009   Influenza, Seasonal, Injecte, Preservative Fre 12/25/2011   Influenza,inj,Quad PF,6+ Mos 10/21/2012, 11/04/2013, 03/10/2015, 10/10/2016, 10/16/2017, 11/12/2018   Influenza-Unspecified 11/30/2015, 11/24/2019, 10/27/2020, 09/12/2021   Moderna SARS-COV2 Booster Vaccination 10/24/2021   PFIZER(Purple Top)SARS-COV-2 Vaccination 02/13/2019, 03/10/2019, 10/27/2020   Pneumococcal Polysaccharide-23 03/10/2015   Tdap 08/19/2012   Zoster Recombinat (Shingrix) 12/15/2020, 05/03/2021   Health Maintenance Due  Topic Date Due   OPHTHALMOLOGY EXAM  12/15/2021   Diabetic kidney evaluation - Urine ACR  05/05/2022   Colonoscopy  10/05/2022      Past Medical History:  Diagnosis Date   Abnormal EKG    NS ST-T EKG changes with negative Stress cardiolite 2003   Anxiety    Dehydration 01/30/2003   [place] , [place]   Diabetes mellitus without complication (HCC)    GERD (gastroesophageal reflux disease)    Hyperglycemia    Hyperlipidemia    IBS (irritable bowel syndrome)    Obesity    OSA (obstructive sleep apnea)    resolved post surgery   Past Surgical History:  Procedure Laterality Date   CHOLECYSTECTOMY N/A 06/07/2019   Procedure: LAPAROSCOPIC CHOLECYSTECTOMY;  Surgeon: Andria Meuse, MD;  Location: [facility] OR;  Service: General;  Laterality: N/A;   TONSILLECTOMY AND ADENOIDECTOMY       with above   UVULOPALATOPHARYNGOPLASTY  2004   Post op bleeding complication;  Dr Marinda Elk TOOTH EXTRACTION      reports that he has never smoked. He has never used smokeless tobacco. He reports that he does not drink alcohol and does not use drugs. family history includes Cirrhosis in his brother; Colon cancer in his paternal grandfather; Diabetes in his maternal grandmother and paternal grandmother; Heart attack (age of onset: 72) in his maternal grandmother and paternal grandmother; Hyperlipidemia in his father and mother; Hypertension in his father and mother; Other in his mother. Allergies  Allergen Reactions   Clomiphene Citrate     Other reaction(s): LFT elevation of  AST/ALT into 500's   Sulfa Antibiotics Other (See Comments)    Patient is not sure what reaction.    Current Outpatient Medications on File Prior to Visit  Medication Sig Dispense Refill   albuterol (VENTOLIN HFA) 108 (90 Base) MCG/ACT inhaler Inhale 2 puffs into the lungs every 6 (six) hours as needed for wheezing or shortness of breath. 8 g 0   cetirizine (ZYRTEC) 10 MG tablet Take 10 mg by mouth daily.     Continuous Blood Gluc Receiver (FREESTYLE LIBRE READER) DEVI Use as directed daily continuos E11.9 1 each 0   Continuous Blood Gluc Sensor (FREESTYLE LIBRE SENSOR SYSTEM) MISC Use as directed continous daily every  14 days E11.9 6 each 3   fluticasone (FLONASE) 50 MCG/ACT nasal spray Place 2 sprays into both nostrils daily. 16 g 6   glucose blood test strip Use as instructed twice a day E 11.9 Contour Next Strips 100 each 2   hyoscyamine (LEVSIN SL) 0.125 MG SL tablet Take 1 tablet (0.125 mg total) by mouth every 8 (eight) hours as needed. 360 tablet 1   ketoconazole (NIZORAL) 2 % shampoo Apply 1 Application topically 2 (two) times a week. 120 mL 6   losartan (COZAAR) 25 MG tablet TAKE 1 TABLET BY MOUTH DAILY 90 tablet 3   metFORMIN (GLUCOPHAGE-XR) 500 MG 24 hr tablet TAKE 4 TABLETS BY MOUTH ONCE  DAILY  WITH BREAKFAST 360 tablet 3   Probiotic Product (ALIGN PO) Take 1 capsule by mouth daily.      rosuvastatin (CRESTOR) 10 MG tablet TAKE 1 TABLET BY MOUTH DAILY 90 tablet 3   solifenacin (VESICARE) 5 MG tablet TAKE 1 TABLET BY MOUTH DAILY 90 tablet 3   tirzepatide (MOUNJARO) 2.5 MG/0.5ML Pen Inject 2.5 mg into the skin once a week. 6 mL 3   venlafaxine XR (EFFEXOR-XR) 75 MG 24 hr capsule Take 225 mg by mouth daily.     benzonatate (TESSALON) 100 MG capsule Take 1 capsule (100 mg total) by mouth 3 (three) times daily as needed for cough. (Patient not taking: Reported on 06/29/2022) 30 capsule 0   cyclobenzaprine (FLEXERIL) 5 MG tablet Take 1 tablet (5 mg total) by mouth 3 (three) times daily as needed for muscle spasms. (Patient not taking: Reported on 06/29/2022) 30 tablet 1   doxycycline (VIBRA-TABS) 100 MG tablet Take 1 tablet (100 mg total) by mouth 2 (two) times daily. (Patient not taking: Reported on 06/29/2022) 14 tablet 0   famotidine (PEPCID) 20 MG tablet Take 1 tablet (20 mg total) by mouth at bedtime. (Patient not taking: Reported on 06/29/2022) 90 tablet 3   RABEprazole (ACIPHEX) 20 MG tablet TAKE 1 TABLET(20 MG) BY MOUTH TWICE DAILY (Patient not taking: Reported on 06/29/2022) 60 tablet 1   No current facility-administered medications on file prior to visit.        ROS:  All others reviewed and negative.  Objective        PE:  BP 128/80 (BP Location: Right Arm, Patient Position: Sitting, Cuff Size: Normal)   Pulse 98   Temp 98.7 F (37.1 C) (Oral)   Ht 5\' 8"  (1.727 m)   Wt 262 lb (118.8 kg)   SpO2 98%   BMI 39.84 kg/m                 Constitutional: Pt appears in NAD               HENT: Head: NCAT.                Right Ear: External ear normal.                 Left Ear: External ear normal.                Eyes: . Pupils are equal, round, and reactive to light. Conjunctivae and EOM are normal               Nose: without d/c or deformity               Neck: Neck supple. Gross  normal ROM  Cardiovascular: Normal rate and regular rhythm.                 Pulmonary/Chest: Effort normal and breath sounds without rales or wheezing.                Abd:  Soft, NT, ND, + BS, no organomegaly               Neurological: Pt is alert. At baseline orientation, motor grossly intact               Skin: Skin is warm. No rashes, no other new lesions, LE edema - ***               Psychiatric: Pt behavior is normal without agitation   Micro: none  Cardiac tracings I have personally interpreted today:  none  Pertinent Radiological findings (summarize): none   Lab Results  Component Value Date   WBC 9.5 09/05/2021   HGB 14.4 09/05/2021   HCT 43.2 09/05/2021   PLT 247 09/05/2021   GLUCOSE 263 (H) 01/03/2022   CHOL 144 01/03/2022   TRIG 167.0 (H) 01/03/2022   HDL 40.20 01/03/2022   LDLDIRECT 53.0 08/29/2017   LDLCALC 70 01/03/2022   ALT 17 01/03/2022   AST 13 01/03/2022   NA 137 01/03/2022   K 3.6 01/03/2022   CL 101 01/03/2022   CREATININE 0.94 01/03/2022   BUN 12 01/03/2022   CO2 30 01/03/2022   TSH 2.115 09/05/2021   PSA 0.54 05/03/2021   HGBA1C 8.3 (H) 01/03/2022   MICROALBUR 0.8 05/04/2021   Assessment/Plan:  Gregory Owens is a 53 y.o. Black or African American [2] male with  has a past medical history of Abnormal EKG, Anxiety, Dehydration (01/30/2003), Diabetes mellitus without complication (HCC), GERD (gastroesophageal reflux disease), Hyperglycemia, Hyperlipidemia, IBS (irritable bowel syndrome), Obesity, and OSA (obstructive sleep apnea).  No problem-specific Assessment & Plan notes found for this encounter.  Followup: No follow-ups on file.  Oliver Barre, MD 06/29/2022 10:49 AM [facility]

## 2022-06-30 ENCOUNTER — Encounter: Payer: Self-pay | Admitting: Internal Medicine

## 2022-06-30 DIAGNOSIS — E538 Deficiency of other specified B group vitamins: Secondary | ICD-10-CM | POA: Insufficient documentation

## 2022-06-30 DIAGNOSIS — R5383 Other fatigue: Secondary | ICD-10-CM | POA: Insufficient documentation

## 2022-06-30 DIAGNOSIS — Z860100 Personal history of colon polyps, unspecified: Secondary | ICD-10-CM | POA: Insufficient documentation

## 2022-06-30 NOTE — Assessment & Plan Note (Signed)
For colonoscopy 

## 2022-06-30 NOTE — Assessment & Plan Note (Signed)
Lab Results  Component Value Date   VITAMINB12 185 (L) 06/29/2022   Low, to start oral replacement - b12 1000 mcg qd

## 2022-06-30 NOTE — Assessment & Plan Note (Signed)
Lab Results  Component Value Date   LDLCALC 85 06/29/2022   Uncontrolled, pt to cont lower chol diet, crestor 10 mg , declines increase for now

## 2022-06-30 NOTE — Assessment & Plan Note (Signed)
Last vitamin D Lab Results  Component Value Date   VD25OH 16.33 (L) 06/29/2022   Low, to start oral replacement

## 2022-06-30 NOTE — Assessment & Plan Note (Signed)
Lab Results  Component Value Date   HGBA1C 8.1 (H) 06/29/2022   Uncontrolled,, pt to continue current medical treatment metformiin er 500 mg - 4 qd, and increased mounjaro 5mg  weekly

## 2022-06-30 NOTE — Assessment & Plan Note (Signed)
Age and sex appropriate education and counseling updated with regular exercise and diet Referrals for preventative services - pt has eye exam June 28 Immunizations addressed - none needed Smoking counseling  - none needed Evidence for depression or other mood disorder - none significant Most recent labs reviewed. I have personally reviewed and have noted: 1) the patient's medical and social history 2) The patient's current medications and supplements 3) The patient's height, weight, and BMI have been recorded in the chart

## 2022-06-30 NOTE — Assessment & Plan Note (Signed)
Also for testosterone level per pt request

## 2022-07-05 ENCOUNTER — Ambulatory Visit (AMBULATORY_SURGERY_CENTER): Payer: No Typology Code available for payment source

## 2022-07-05 ENCOUNTER — Encounter: Payer: Self-pay | Admitting: Internal Medicine

## 2022-07-05 VITALS — Ht 68.0 in | Wt 260.0 lb

## 2022-07-05 DIAGNOSIS — Z86010 Personal history of colonic polyps: Secondary | ICD-10-CM

## 2022-07-05 MED ORDER — NA SULFATE-K SULFATE-MG SULF 17.5-3.13-1.6 GM/177ML PO SOLN
1.0000 | Freq: Once | ORAL | 0 refills | Status: AC
Start: 2022-07-05 — End: 2022-07-05

## 2022-07-05 NOTE — Progress Notes (Signed)
No egg or soy allergy known to patient  No issues known to pt with past sedation with any surgeries or procedures Patient denies ever being told they had issues or difficulty with intubation  No FH of Malignant Hyperthermia Pt is not on diet pills Pt is not on  home 02  Pt is not on blood thinners  Pt denies issues with constipation  No A fib or A flutter Have any cardiac testing pending--no  Pt is ambulatory   Patient's chart reviewed by Cathlyn Parsons CNRA prior to previsit and patient appropriate for the [facility].  Previsit completed and red dot placed by patient's name on their procedure day (on provider's schedule).     PV complete. Prep instructions reviewed and sent via mychart and home address. Pt advised to read all highlighted areas in the packet and to call the office back if there are any questions.   Goodrx coupon for [employer] provided. Pt instructed to use Singlecare.com or GoodRx for a price reduction on prep

## 2022-07-06 ENCOUNTER — Other Ambulatory Visit: Payer: Self-pay

## 2022-07-06 MED ORDER — TIRZEPATIDE 5 MG/0.5ML ~~LOC~~ SOAJ: 5.0000 mg | SUBCUTANEOUS | 11 refills | Status: DC

## 2022-07-17 ENCOUNTER — Encounter: Payer: Self-pay | Admitting: Gastroenterology

## 2022-07-30 ENCOUNTER — Telehealth: Payer: Self-pay | Admitting: Gastroenterology

## 2022-07-30 NOTE — Telephone Encounter (Signed)
RN responded to patient regarding his question about eating vegetables and salad prior to his colonoscopy with Dr. Adela Lank on July 3. Patient informed RN that he had last eaten these foods on Saturday, June 29. He stated he also had not taken any of the Miralax that was to be started 7 days prior to his colonoscopy.   RN told patient she would check with Dr. Adela Lank regarding whether or not he wanted to proceed with colonoscopy as scheduled. RN told patient that in the mean time he should take a dose of Miralax today and another dose tomorrow in addition to drinking a lot of water. RN told patient she would respond back to him after hearing back from Dr. Adela Lank.

## 2022-07-30 NOTE — Telephone Encounter (Signed)
Patientcalled to let us know he has been eating salads and veggies. He is scheduled for 08/01/22. Please advise.

## 2022-07-30 NOTE — Telephone Encounter (Signed)
I think he can proceed if no further vegetables since 6/29. He should take Miralax three times daily until his procedure - today and tomorrow. Thanks

## 2022-07-30 NOTE — Telephone Encounter (Signed)
RN contacted patient to provide instructions given by Dr. Adela Lank regarding the need to avoid any further vegetables as well as avoiding the other restricted foods for the 5-days prior to procedure. RN also instructed patient per Dr. Adela Lank to take 3 doses of Miralax today as well as tomorrow in addition to following his previously-provided prep instructions. Patient stated understanding.

## 2022-07-30 NOTE — Progress Notes (Deleted)
   Rubin Payor, PhD, LAT, ATC acting as a scribe for [name], MD.  Gregory Owens is a 52 y.o. male who presents to [facility] at [facility] today for 21-month f/u R knee pain. Pt was last seen by Dr. Denyse Amass on 05/02/22 and was given a R knee steroid injection and was advised to work on quad strengthening and use Voltaren gel.  Today, pt reports ***  Dx imaging: 01/03/22 R knee XR   Pertinent review of systems: ***  Relevant historical information: ***   Exam:  There were no vitals taken for this visit. General: Well Developed, well nourished, and in no acute distress.   MSK: ***    Lab and Radiology Results No results found for this or any previous visit (from the past 72 hour(s)). No results found.     Assessment and Plan: 52 y.o. male with ***   PDMP not reviewed this encounter. No orders of the defined types were placed in this encounter.  No orders of the defined types were placed in this encounter.    Discussed warning signs or symptoms. Please see discharge instructions. Patient expresses understanding.   ***

## 2022-07-31 ENCOUNTER — Ambulatory Visit: Payer: No Typology Code available for payment source | Admitting: Family Medicine

## 2022-07-31 NOTE — Telephone Encounter (Signed)
Inbound call from patient rescheduling for procedure due to him being out of town.

## 2022-08-01 ENCOUNTER — Encounter: Payer: No Typology Code available for payment source | Admitting: Gastroenterology

## 2022-08-14 ENCOUNTER — Ambulatory Visit: Payer: No Typology Code available for payment source | Admitting: Family Medicine

## 2022-08-14 NOTE — Progress Notes (Deleted)
   Rubin Payor, PhD, LAT, ATC acting as a scribe for Gregory Graham, MD.  Gregory Owens is a 52 y.o. male who presents to [facility] at [facility] today for 21-month f/u R knee pain. Pt was last seen by Dr. Denyse Owens on 05/02/22 and was given a R knee steroid injection and was advised to work on quad strengthening and use Voltaren gel.  Today, pt reports ***  Dx imaging: 01/03/22 R knee XR   Pertinent review of systems: ***  Relevant historical information: ***   Exam:  There were no vitals taken for this visit. General: Well Developed, well nourished, and in no acute distress.   MSK: ***    Lab and Radiology Results No results found for this or any previous visit (from the past 72 hour(s)). No results found.     Assessment and Plan: 52 y.o. male with ***   PDMP not reviewed this encounter. No orders of the defined types were placed in this encounter.  No orders of the defined types were placed in this encounter.    Discussed warning signs or symptoms. Please see discharge instructions. Patient expresses understanding.   ***

## 2022-08-23 ENCOUNTER — Encounter: Payer: Self-pay | Admitting: Gastroenterology

## 2022-08-23 ENCOUNTER — Ambulatory Visit (AMBULATORY_SURGERY_CENTER): Payer: No Typology Code available for payment source | Admitting: Gastroenterology

## 2022-08-23 VITALS — BP 110/74 | HR 89 | Temp 97.1°F | Resp 13 | Ht 68.0 in | Wt 260.0 lb

## 2022-08-23 DIAGNOSIS — Z86010 Personal history of colonic polyps: Secondary | ICD-10-CM

## 2022-08-23 DIAGNOSIS — D123 Benign neoplasm of transverse colon: Secondary | ICD-10-CM

## 2022-08-23 DIAGNOSIS — Z09 Encounter for follow-up examination after completed treatment for conditions other than malignant neoplasm: Secondary | ICD-10-CM | POA: Diagnosis not present

## 2022-08-23 DIAGNOSIS — K635 Polyp of colon: Secondary | ICD-10-CM

## 2022-08-23 MED ORDER — SODIUM CHLORIDE 0.9 % IV SOLN: 500.0000 mL | Freq: Once | INTRAVENOUS | Status: DC

## 2022-08-23 NOTE — Patient Instructions (Signed)

## 2022-08-23 NOTE — Progress Notes (Signed)
Called to room to assist during endoscopic procedure.  Patient ID and intended procedure confirmed with present staff. Received instructions for my participation in the procedure from the performing physician.  

## 2022-08-23 NOTE — Op Note (Signed)
[facility] Patient Name: Gregory Owens Procedure Date: 08/23/2022 1:51 PM MRN: [id number] Endoscopist: Viviann Spare P. Adela Lank , MD, [id number] Age: 52 Referring MD:  Date of Birth: 04/24/1970 Gender: Male Account #: [id number] Procedure:                Colonoscopy Indications:              High risk colon cancer surveillance: Personal                            history of colonic polyps - advanced polyp removed                            07/2019 Medicines:                Monitored Anesthesia Care Procedure:                Pre-Anesthesia Assessment:                           - Prior to the procedure, a History and Physical                            was performed, and patient medications and                            allergies were reviewed. The patient's tolerance of                            previous anesthesia was also reviewed. The risks                            and benefits of the procedure and the sedation                            options and risks were discussed with the patient.                            All questions were answered, and informed consent                            was obtained. Prior Anticoagulants: The patient has                            taken no anticoagulant or antiplatelet agents. ASA                            Grade Assessment: III - A patient with severe                            systemic disease. After reviewing the risks and                            benefits, the patient was deemed in satisfactory  condition to undergo the procedure.                           After obtaining informed consent, the colonoscope                            was passed under direct vision. Throughout the                            procedure, the patient's blood pressure, pulse, and                            oxygen saturations were monitored continuously. The                            Olympus CF-HQ190L [id number]) Colonoscope  was                            introduced through the anus and advanced to the the                            cecum, identified by appendiceal orifice and                            ileocecal valve. The colonoscopy was performed                            without difficulty. The patient tolerated the                            procedure well. The quality of the bowel                            preparation was good. The ileocecal valve,                            appendiceal orifice, and rectum were photographed. Scope In: 1:59:13 PM Scope Out: 2:16:41 PM Scope Withdrawal Time: 0 hours 12 minutes 43 seconds  Total Procedure Duration: 0 hours 17 minutes 28 seconds  Findings:                 The perianal and digital rectal examinations were                            normal.                           Multiple small-mouthed diverticula were found in                            the sigmoid colon. There was some restricted                            mobility / luminal narrowing of the sigmoid colon  secondary to this.                           A 3 mm polyp was found in the transverse colon. The                            polyp was sessile. The polyp was removed with a                            cold snare. Resection and retrieval were complete.                           Internal hemorrhoids were found during retroflexion.                           The exam was otherwise without abnormality. Complications:            No immediate complications. Estimated blood loss:                            Minimal. Estimated Blood Loss:     Estimated blood loss was minimal. Impression:               - Diverticulosis in the sigmoid colon.                           - One 3 mm polyp in the transverse colon, removed                            with a cold snare. Resected and retrieved.                           - Internal hemorrhoids.                           - The examination was  otherwise normal. Recommendation:           - Patient has a contact number available for                            emergencies. The signs and symptoms of potential                            delayed complications were discussed with the                            patient. Return to normal activities tomorrow.                            Written discharge instructions were provided to the                            patient.                           - Resume previous diet.                           -  Continue present medications.                           - Await pathology results.                           - Repeat colonoscopy in 5 years given advanced                            polyp removed on last colonoscopy. Viviann Spare P. Scharlene Catalina, MD 08/23/2022 2:21:07 PM This report has been signed electronically.

## 2022-08-23 NOTE — Progress Notes (Signed)
Uneventful anesthetic. Patient did obstruct during anesthetic, which was relieved with an OAW. OAW removed at end of case, with patient able to maintain own airway without obstruction.   Report to pacu rn. Vss on Icard O2. Care resumed by rn.

## 2022-08-23 NOTE — Progress Notes (Signed)
[facility] History and Physical   Primary Care Physician:  Corwin Levins, MD   Reason for Procedure:   History of colon polyps  Plan:    colonoscopy     HPI: Gregory Owens is a 52 y.o. male  here for colonoscopy surveillance - advanced adenoma removed 07/2019 - 16mm cecal polyp.   Patient denies any bowel symptoms at this time. No family history of colon cancer known. Otherwise feels well without any cardiopulmonary symptoms.   I have discussed risks / benefits of anesthesia and endoscopic procedure with Jorge Myrtice Lauth and they wish to proceed with the exams as outlined today.    Past Medical History:  Diagnosis Date   Abnormal EKG    NS ST-T EKG changes with negative Stress cardiolite 2003   Anxiety    Dehydration 01/30/2003   [place] , [place]   Diabetes mellitus without complication (HCC)    GERD (gastroesophageal reflux disease)    Hyperglycemia    Hyperlipidemia    IBS (irritable bowel syndrome)    Obesity    OSA (obstructive sleep apnea)    resolved post surgery    Past Surgical History:  Procedure Laterality Date   CHOLECYSTECTOMY N/A 06/07/2019   Procedure: LAPAROSCOPIC CHOLECYSTECTOMY;  Surgeon: Andria Meuse, MD;  Location: [facility] OR;  Service: General;  Laterality: N/A;   COLONOSCOPY     TONSILLECTOMY AND ADENOIDECTOMY      with above   UVULOPALATOPHARYNGOPLASTY  01/29/2002   Post op bleeding complication;  Dr Marinda Elk TOOTH EXTRACTION      Prior to Admission medications   Medication Sig Start Date End Date Taking? Authorizing Provider  cetirizine (ZYRTEC) 10 MG tablet Take 10 mg by mouth daily.   Yes [provider]  glucose blood test strip Use as instructed twice a day E 11.9 Contour Next Strips 05/25/19  Yes Corwin Levins, MD  losartan (COZAAR) 25 MG tablet TAKE 1 TABLET BY MOUTH DAILY 02/02/22  Yes Corwin Levins, MD  metFORMIN (GLUCOPHAGE-XR) 500 MG 24 hr tablet TAKE 4 TABLETS BY MOUTH ONCE  DAILY WITH  BREAKFAST 12/25/21  Yes Corwin Levins, MD  Probiotic Product (ALIGN PO) Take 1 capsule by mouth daily.    Yes [provider]  rosuvastatin (CRESTOR) 10 MG tablet TAKE 1 TABLET BY MOUTH DAILY 06/14/22  Yes Corwin Levins, MD  solifenacin (VESICARE) 5 MG tablet TAKE 1 TABLET BY MOUTH DAILY 06/14/22  Yes Corwin Levins, MD  venlafaxine XR (EFFEXOR-XR) 75 MG 24 hr capsule Take 225 mg by mouth daily. 09/20/21  Yes [provider]  albuterol (VENTOLIN HFA) 108 (90 Base) MCG/ACT inhaler Inhale 2 puffs into the lungs every 6 (six) hours as needed for wheezing or shortness of breath. 05/11/22   Margaretann Loveless, PA-C  benzonatate (TESSALON) 100 MG capsule Take 1 capsule (100 mg total) by mouth 3 (three) times daily as needed for cough. Patient not taking: Reported on 06/29/2022 05/11/22   Margaretann Loveless, PA-C  Continuous Blood Gluc Receiver (FREESTYLE LIBRE READER) DEVI Use as directed daily continuos E11.9 09/12/20   Corwin Levins, MD  Continuous Blood Gluc Sensor (FREESTYLE LIBRE SENSOR SYSTEM) MISC Use as directed continous daily every 14 days E11.9 09/18/21   Corwin Levins, MD  cyclobenzaprine (FLEXERIL) 5 MG tablet Take 1 tablet (5 mg total) by mouth 3 (three) times daily as needed for muscle spasms. 03/29/20   Corwin Levins, MD  doxycycline (VIBRA-TABS)  100 MG tablet Take 1 tablet (100 mg total) by mouth 2 (two) times daily. Patient not taking: Reported on 06/29/2022 05/11/22   Margaretann Loveless, PA-C  famotidine (PEPCID) 20 MG tablet Take 1 tablet (20 mg total) by mouth at bedtime. Patient not taking: Reported on 06/29/2022 07/15/19   Corwin Levins, MD  fluticasone [facility]) 50 MCG/ACT nasal spray Place 2 sprays into both nostrils daily. 08/18/20   Margaretann Loveless, PA-C  hyoscyamine (LEVSIN SL) 0.125 MG SL tablet Take 1 tablet (0.125 mg total) by mouth every 8 (eight) hours as needed. Patient not taking: Reported on 07/05/2022 06/19/22   Arnaldo Natal, NP  ketoconazole  (NIZORAL) 2 % shampoo Apply 1 Application topically 2 (two) times a week. Patient not taking: Reported on 08/23/2022 02/01/22   Corwin Levins, MD  RABEprazole (ACIPHEX) 20 MG tablet TAKE 1 TABLET(20 MG) BY MOUTH TWICE DAILY Patient not taking: Reported on 06/29/2022 02/06/22   Arnaldo Natal, NP  tirzepatide [facility]) 5 MG/0.5ML Pen Inject 5 mg into the skin once a week. 07/06/22   Corwin Levins, MD    Current Outpatient Medications  Medication Sig Dispense Refill   cetirizine (ZYRTEC) 10 MG tablet Take 10 mg by mouth daily.     glucose blood test strip Use as instructed twice a day E 11.9 Contour Next Strips 100 each 2   losartan (COZAAR) 25 MG tablet TAKE 1 TABLET BY MOUTH DAILY 90 tablet 3   metFORMIN (GLUCOPHAGE-XR) 500 MG 24 hr tablet TAKE 4 TABLETS BY MOUTH ONCE  DAILY WITH BREAKFAST 360 tablet 3   Probiotic Product (ALIGN PO) Take 1 capsule by mouth daily.      rosuvastatin (CRESTOR) 10 MG tablet TAKE 1 TABLET BY MOUTH DAILY 90 tablet 3   solifenacin (VESICARE) 5 MG tablet TAKE 1 TABLET BY MOUTH DAILY 90 tablet 3   venlafaxine XR (EFFEXOR-XR) 75 MG 24 hr capsule Take 225 mg by mouth daily.     albuterol (VENTOLIN HFA) 108 (90 Base) MCG/ACT inhaler Inhale 2 puffs into the lungs every 6 (six) hours as needed for wheezing or shortness of breath. 8 g 0   benzonatate (TESSALON) 100 MG capsule Take 1 capsule (100 mg total) by mouth 3 (three) times daily as needed for cough. (Patient not taking: Reported on 06/29/2022) 30 capsule 0   Continuous Blood Gluc Receiver (FREESTYLE LIBRE READER) DEVI Use as directed daily continuos E11.9 1 each 0   Continuous Blood Gluc Sensor (FREESTYLE LIBRE SENSOR SYSTEM) MISC Use as directed continous daily every 14 days E11.9 6 each 3   cyclobenzaprine (FLEXERIL) 5 MG tablet Take 1 tablet (5 mg total) by mouth 3 (three) times daily as needed for muscle spasms. 30 tablet 1   doxycycline (VIBRA-TABS) 100 MG tablet Take 1 tablet (100 mg total) by mouth 2 (two)  times daily. (Patient not taking: Reported on 06/29/2022) 14 tablet 0   famotidine (PEPCID) 20 MG tablet Take 1 tablet (20 mg total) by mouth at bedtime. (Patient not taking: Reported on 06/29/2022) 90 tablet 3   fluticasone (FLONASE) 50 MCG/ACT nasal spray Place 2 sprays into both nostrils daily. 16 g 6   hyoscyamine (LEVSIN SL) 0.125 MG SL tablet Take 1 tablet (0.125 mg total) by mouth every 8 (eight) hours as needed. (Patient not taking: Reported on 07/05/2022) 360 tablet 1   ketoconazole (NIZORAL) 2 % shampoo Apply 1 Application topically 2 (two) times a week. (Patient not taking: Reported on 08/23/2022) 120  mL 6   RABEprazole (ACIPHEX) 20 MG tablet TAKE 1 TABLET(20 MG) BY MOUTH TWICE DAILY (Patient not taking: Reported on 06/29/2022) 60 tablet 1   tirzepatide (MOUNJARO) 5 MG/0.5ML Pen Inject 5 mg into the skin once a week. 6 mL 11   Current Facility-Administered Medications  Medication Dose Route Frequency Provider Last Rate Last Admin   0.9 %  sodium chloride infusion  500 mL Intravenous Once Kandon Hosking, Willaim Rayas, MD        Allergies as of 08/23/2022 - Review Complete 08/23/2022  Allergen Reaction Noted   Clomiphene citrate  02/17/2021   Sulfa antibiotics Other (See Comments) 03/16/2020    Family History  Problem Relation Age of Onset   Hyperlipidemia Mother    Hypertension Mother    Other Mother        Inflammatory Occipital Lymphadenitis   Hypertension Father    Hyperlipidemia Father    Cirrhosis Brother        autoimmune hepatitis (twin)   Diabetes Maternal Grandmother    Heart attack Maternal Grandmother 94   Diabetes Paternal Grandmother    Heart attack Paternal Grandmother 24   Prostate cancer Paternal Grandfather    Stomach cancer Neg Hx    Esophageal cancer Neg Hx    Rectal cancer Neg Hx    Colon cancer Neg Hx     Social History   Socioeconomic History   Marital status: Married    Spouse name: Not on file   Number of children: 1   Years of education: Not on file    Highest education level: Not on file  Occupational History   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   Occupation: Production designer, theatre/television/film  Tobacco Use   Smoking status: Never   Smokeless tobacco: Never  Vaping Use   Vaping status: Never Used  Substance and Sexual Activity   Alcohol use: No   Drug use: No   Sexual activity: Not on file  Other Topics Concern   Not on file  Social History Narrative   Regular exercise: no            Social Determinants of Health   Financial Resource Strain: Not on file  Food Insecurity: Not on file  Transportation Needs: Not on file  Physical Activity: Not on file  Stress: Not on file  Social Connections: Not on file  Intimate Partner Violence: Not on file    Review of Systems: All other review of systems negative except as mentioned in the HPI.  Physical Exam: Vital signs BP 124/72   Pulse (!) 110   Temp (!) 97.1 F (36.2 C)   Ht 5\' 8"  (1.727 m)   Wt 260 lb (117.9 kg)   SpO2 96%   BMI 39.53 kg/m   General:   Alert,  Well-developed, pleasant and cooperative in NAD Lungs:  Clear throughout to auscultation.   Heart:  Regular rate and rhythm Abdomen:  Soft, nontender and nondistended.   Neuro/Psych:  Alert and cooperative. Normal mood and affect. A and O x 3  Harlin Rain, MD [facility]

## 2022-08-23 NOTE — Progress Notes (Signed)
Cell phone off per pt Pt's states no medical or surgical changes since previsit or office visit.  

## 2022-08-23 NOTE — Progress Notes (Unsigned)
error 

## 2022-08-24 ENCOUNTER — Telehealth: Payer: Self-pay

## 2022-08-24 NOTE — Telephone Encounter (Signed)
  Follow up Call-     08/23/2022    1:11 PM  Call back number  Post procedure Call Back phone  # [phone]  Permission to leave phone message Yes    Follow up call, LVM

## 2022-09-20 ENCOUNTER — Other Ambulatory Visit: Payer: Self-pay

## 2022-09-20 ENCOUNTER — Other Ambulatory Visit: Payer: Self-pay | Admitting: Internal Medicine

## 2022-09-20 ENCOUNTER — Ambulatory Visit: Payer: No Typology Code available for payment source | Admitting: Family Medicine

## 2022-09-20 VITALS — BP 130/84 | HR 102 | Ht 68.0 in | Wt 267.0 lb

## 2022-09-20 DIAGNOSIS — G8929 Other chronic pain: Secondary | ICD-10-CM

## 2022-09-20 DIAGNOSIS — M25561 Pain in right knee: Secondary | ICD-10-CM

## 2022-09-20 NOTE — Progress Notes (Signed)
    Gregory Owens is a 52 y.o. male who presents to [facility] at [facility] today for 23-month f/u R knee pain. Pt was last seen by Dr. Denyse Amass on 05/02/22 and was given a R knee steroid injection and was advised to work on quad strengthening and use Voltaren gel.  Today, pt reports his mother has passed away since we have seen him last. R knee pain has started to return over the last 2-wks. Pt locates pain to the anterior-lateral aspect. He notes difficulty getting out of his car.   Dx imaging: 01/03/22 R knee XR   Pertinent review of systems: No fevers or chills  Relevant historical information: Sleep apnea.  Diabetes.   Exam:  BP 130/84   Pulse (!) 102   Ht 5\' 8"  (1.727 m)   Wt 267 lb (121.1 kg)   SpO2 97%   BMI 40.60 kg/m  General: Well Developed, well nourished, and in no acute distress.   MSK: Right knee mild effusion normal motion with crepitation.  Tender palpation medial joint line.    Lab and Radiology Results  Procedure: Real-time Ultrasound Guided Injection of right knee joint superior lateral patella space Device: Philips Affiniti 50G/GE Logiq Images permanently stored and available for review in PACS Verbal informed consent obtained.  Discussed risks and benefits of procedure. Warned about infection, bleeding, hyperglycemia damage to structures among others. Patient expresses understanding and agreement Time-out conducted.   Noted no overlying erythema, induration, or other signs of local infection.   Skin prepped in a sterile fashion.   Local anesthesia: Topical Ethyl chloride.   With sterile technique and under real time ultrasound guidance: 40 mg of Kenalog and 2 mL of Marcaine injected into knee joint. Fluid seen entering the joint capsule.   Completed without difficulty   Pain immediately resolved suggesting accurate placement of the medication.   Advised to call if fevers/chills, erythema, induration, drainage, or persistent  bleeding.   Images permanently stored and available for review in the ultrasound unit.  Impression: Technically successful ultrasound guided injection.       Assessment and Plan: 52 y.o. male with right knee pain due to exacerbation of underlying DJD.  This is an acute exacerbation of a chronic problem.  Plan for steroid injection today.  Recheck as needed.  Could repeat this injection in 3 months.   PDMP not reviewed this encounter. Orders Placed This Encounter  Procedures   [place] LIMITED JOINT SPACE STRUCTURES LOW RIGHT(NO LINKED CHARGES)    Order Specific Question:   Reason for Exam (SYMPTOM  OR DIAGNOSIS REQUIRED)    Answer:   right knee pain    Order Specific Question:   Preferred imaging location?    Answer:   [facility]   No orders of the defined types were placed in this encounter.    Discussed warning signs or symptoms. Please see discharge instructions. Patient expresses understanding.   The above documentation has been reviewed and is accurate and complete [name], M.D.

## 2022-09-20 NOTE — Patient Instructions (Addendum)
Thank you for coming in today.   You received an injection today. Seek immediate medical attention if the joint becomes red, extremely painful, or is oozing fluid.   Check back as needed I could do this injection again in 3 months (around Thanksgiving)

## 2022-09-21 ENCOUNTER — Other Ambulatory Visit: Payer: Self-pay

## 2022-09-26 ENCOUNTER — Encounter: Payer: Self-pay | Admitting: Internal Medicine

## 2022-09-26 ENCOUNTER — Telehealth: Payer: No Typology Code available for payment source | Admitting: Nurse Practitioner

## 2022-09-26 ENCOUNTER — Ambulatory Visit (INDEPENDENT_AMBULATORY_CARE_PROVIDER_SITE_OTHER): Payer: No Typology Code available for payment source | Admitting: Internal Medicine

## 2022-09-26 ENCOUNTER — Telehealth: Payer: No Typology Code available for payment source | Admitting: Family Medicine

## 2022-09-26 VITALS — BP 122/78 | HR 110 | Temp 98.5°F | Ht 68.0 in | Wt 262.0 lb

## 2022-09-26 DIAGNOSIS — E538 Deficiency of other specified B group vitamins: Secondary | ICD-10-CM

## 2022-09-26 DIAGNOSIS — E7800 Pure hypercholesterolemia, unspecified: Secondary | ICD-10-CM

## 2022-09-26 DIAGNOSIS — R300 Dysuria: Secondary | ICD-10-CM

## 2022-09-26 DIAGNOSIS — E559 Vitamin D deficiency, unspecified: Secondary | ICD-10-CM | POA: Diagnosis not present

## 2022-09-26 DIAGNOSIS — E1165 Type 2 diabetes mellitus with hyperglycemia: Secondary | ICD-10-CM | POA: Diagnosis not present

## 2022-09-26 DIAGNOSIS — R350 Frequency of micturition: Secondary | ICD-10-CM

## 2022-09-26 LAB — BASIC METABOLIC PANEL
BUN: 13 mg/dL (ref 6–23)
CO2: 27 meq/L (ref 19–32)
Calcium: 8.9 mg/dL (ref 8.4–10.5)
Chloride: 97 meq/L (ref 96–112)
Creatinine, Ser: 0.95 mg/dL (ref 0.40–1.50)
GFR: 91.53 mL/min (ref >60.00)
Glucose, Bld: 432 mg/dL — ABNORMAL HIGH (ref 70–99)
Potassium: 3.8 meq/L (ref 3.5–5.1)
Sodium: 132 meq/L — ABNORMAL LOW (ref 135–145)

## 2022-09-26 MED ORDER — OZEMPIC (0.25 OR 0.5 MG/DOSE) 2 MG/3ML ~~LOC~~ SOPN: PEN_INJECTOR | SUBCUTANEOUS | 11 refills | Status: DC

## 2022-09-26 MED ORDER — VENLAFAXINE HCL ER 75 MG PO CP24: 225.0000 mg | ORAL_CAPSULE | Freq: Every day | ORAL | 3 refills | Status: AC

## 2022-09-26 MED ORDER — METFORMIN HCL ER 500 MG PO TB24: ORAL_TABLET | ORAL | 3 refills | Status: DC

## 2022-09-26 MED ORDER — SOLIFENACIN SUCCINATE 5 MG PO TABS: 5.0000 mg | ORAL_TABLET | Freq: Every day | ORAL | 3 refills | Status: DC

## 2022-09-26 MED ORDER — LOSARTAN POTASSIUM 25 MG PO TABS: ORAL_TABLET | ORAL | 3 refills | Status: DC

## 2022-09-26 MED ORDER — ROSUVASTATIN CALCIUM 10 MG PO TABS: 10.0000 mg | ORAL_TABLET | Freq: Every day | ORAL | 3 refills | Status: DC

## 2022-09-26 NOTE — Assessment & Plan Note (Signed)
Last vitamin D Lab Results  Component Value Date   VD25OH 16.33 (L) 06/29/2022   Low, to start oral replacement

## 2022-09-26 NOTE — Assessment & Plan Note (Signed)
Lab Results  Component Value Date   LDLCALC 85 06/29/2022   Uncontrolled, goal ldl < 70,, pt to continue current statin crestor 10 mg  declines other change for now

## 2022-09-26 NOTE — Progress Notes (Signed)
Since you have also scheduled a Video Visit we will cancel this visit so you are not charged twice. Thank you         If you convert to a video visit, we will bill your insurance (similar to an office visit) and you will not be charged for this e-Visit. You will be able to stay at home and speak with the first available [facility] advanced practice provider. The link to do a video visit is in the drop down Menu tab of your Welcome screen in MyChart.

## 2022-09-26 NOTE — Assessment & Plan Note (Signed)
I susepct much worsening dm hyperglycemia after pt stopped his metfomrin and mounjaro; ok to restart the meformin, and change the mounjaro to ozempic 0.25 mg weekly, with attention to increase in 1 mo if tolerates ok

## 2022-09-26 NOTE — Patient Instructions (Addendum)
Ok to stop the [employer] as you have  Please take all new medication as prescribed - the ozempic instead;  also please call or do the mychart message in 1 month if you are taking the ozempic well, so we can increase the dose  Please restart all of your other medications  Please continue all other medications as before, and refills have been done if requested.  Please have the pharmacy call with any other refills you may need.  Please keep your appointments with your specialists as you may have planned  Please go to the LAB at the blood drawing area for the tests to be done - for the sugar and urine today  You will be contacted by phone if any changes need to be made immediately.  Otherwise, you will receive a letter about your results with an explanation, but please check with MyChart first.  Please make an Appointment to return in 4 months, or sooner if needed

## 2022-09-26 NOTE — Progress Notes (Signed)
Virtual Visit Consent   Gregory Owens, you are scheduled for a virtual visit with a [facility] provider today. Just as with appointments in the office, your consent must be obtained to participate. Your consent will be active for this visit and any virtual visit you may have with one of our providers in the next 365 days. If you have a MyChart account, a copy of this consent can be sent to you electronically.  As this is a virtual visit, video technology does not allow for your provider to perform a traditional examination. This may limit your provider's ability to fully assess your condition. If your provider identifies any concerns that need to be evaluated in person or the need to arrange testing (such as labs, EKG, etc.), we will make arrangements to do so. Although advances in technology are sophisticated, we cannot ensure that it will always work on either your end or our end. If the connection with a video visit is poor, the visit may have to be switched to a telephone visit. With either a video or telephone visit, we are not always able to ensure that we have a secure connection.  By engaging in this virtual visit, you consent to the provision of healthcare and authorize for your insurance to be billed (if applicable) for the services provided during this visit. Depending on your insurance coverage, you may receive a charge related to this service.  I need to obtain your verbal consent now. Are you willing to proceed with your visit today? Mussa Aydin Bano has provided verbal consent on 09/26/2022 for a virtual visit (video or telephone). Freddy Finner, NP  Date: 09/26/2022 10:35 AM  Virtual Visit via Video Note   I, Freddy Finner, connected with  Jevonte Sung Lennette Bihari  ([id number], 18-Jun-1970) on 09/26/22 at 10:45 AM EDT by a video-enabled telemedicine application and verified that I am speaking with the correct person using two identifiers.  Location: Patient:  Virtual Visit Location Patient: Home Provider: Virtual Visit Location Provider: Home Office   I discussed the limitations of evaluation and management by telemedicine and the availability of in person appointments. The patient expressed understanding and agreed to proceed.    History of Present Illness: Fergus Castle Dougall is a 52 y.o. who identifies as a male who was assigned male at birth, and is being seen today for increased urination day and night.  Patient reports not taking his metformin or vesicare like he should. Has increased urination day and night and does not know if related to UTI his DM or prostate.    Problems:  Patient Active Problem List   Diagnosis Date Noted   B12 deficiency 06/30/2022   Other fatigue 06/30/2022   History of colonic polyps 06/30/2022   Non-recurrent acute serous otitis media of left ear 01/25/2022   Right knee pain 01/06/2022   Abdominal pain, generalized 08/02/2021   Abnormal LFTs 02/17/2021   Abnormal serum lipase level 02/17/2021   Dark urine 12/16/2020   Body mass index (BMI) 38.0-38.9, adult 03/29/2020   Hyperglycemia due to type 2 diabetes mellitus (HCC) 03/29/2020   Metabolic syndrome 03/29/2020   Nonalcoholic steatohepatitis (NASH) 03/29/2020   Vitamin D deficiency 03/29/2020   Low back pain 03/29/2020   Right flank pain, chronic 02/08/2020   Right lower quadrant abdominal pain 12/20/2019   Hematochezia 07/09/2019   Cholecystitis 06/07/2019   S/P laparoscopic cholecystectomy 06/07/2019   Headache 02/08/2019   Urinary urgency 11/12/2018   Hypogonadism in male 09/04/2017  Bilateral foot pain 03/08/2017   Dysphagia 08/29/2016   ADHD 03/28/2016   Morbid obesity (HCC) 12/06/2015   Achrochordon 10/11/2015   Seborrheic dermatitis 10/11/2015   Encounter for well adult exam with abnormal findings 03/10/2015   Skin lesion 03/10/2015   GERD (gastroesophageal reflux disease) 11/04/2013   Tachycardia 11/04/2013   Nonspecific  abnormal electrocardiogram (ECG) (EKG) 11/04/2013   Non-compliant behavior 10/14/2013   Diabetes (HCC) 08/19/2012   PLMD (periodic limb movement disorder) 05/30/2012   SLEEP DISORDER 12/05/2009   Hyperlipidemia 08/25/2009   GAD (generalized anxiety disorder) 08/25/2009   Obstructive sleep apnea 08/25/2009   RHINITIS 05/24/2009    Allergies:  Allergies  Allergen Reactions   Clomiphene Citrate     Other reaction(s): LFT elevation of  AST/ALT into 500's   Sulfa Antibiotics Other (See Comments)    Patient is not sure what reaction.    Medications:  Current Outpatient Medications:    albuterol (VENTOLIN HFA) 108 (90 Base) MCG/ACT inhaler, Inhale 2 puffs into the lungs every 6 (six) hours as needed for wheezing or shortness of breath., Disp: 8 g, Rfl: 0   benzonatate (TESSALON) 100 MG capsule, Take 1 capsule (100 mg total) by mouth 3 (three) times daily as needed for cough. (Patient not taking: Reported on 06/29/2022), Disp: 30 capsule, Rfl: 0   cetirizine (ZYRTEC) 10 MG tablet, Take 10 mg by mouth daily., Disp: , Rfl:    Continuous Blood Gluc Receiver (FREESTYLE LIBRE READER) DEVI, Use as directed daily continuos E11.9, Disp: 1 each, Rfl: 0   Continuous Blood Gluc Sensor (FREESTYLE LIBRE SENSOR SYSTEM) MISC, Use as directed continous daily every 14 days E11.9, Disp: 6 each, Rfl: 3   cyclobenzaprine (FLEXERIL) 5 MG tablet, Take 1 tablet (5 mg total) by mouth 3 (three) times daily as needed for muscle spasms., Disp: 30 tablet, Rfl: 1   famotidine (PEPCID) 20 MG tablet, Take 1 tablet (20 mg total) by mouth at bedtime. (Patient not taking: Reported on 06/29/2022), Disp: 90 tablet, Rfl: 3   fluticasone (FLONASE) 50 MCG/ACT nasal spray, Place 2 sprays into both nostrils daily., Disp: 16 g, Rfl: 6   glucose blood test strip, Use as instructed twice a day E 11.9 Contour Next Strips, Disp: 100 each, Rfl: 2   hyoscyamine (LEVSIN SL) 0.125 MG SL tablet, Take 1 tablet (0.125 mg total) by mouth every 8  (eight) hours as needed. (Patient not taking: Reported on 07/05/2022), Disp: 360 tablet, Rfl: 1   ketoconazole (NIZORAL) 2 % shampoo, Apply 1 Application topically 2 (two) times a week. (Patient not taking: Reported on 08/23/2022), Disp: 120 mL, Rfl: 6   losartan (COZAAR) 25 MG tablet, TAKE 1 TABLET BY MOUTH DAILY, Disp: 90 tablet, Rfl: 3   metFORMIN (GLUCOPHAGE-XR) 500 MG 24 hr tablet, TAKE 4 TABLETS BY MOUTH ONCE  DAILY WITH BREAKFAST, Disp: 360 tablet, Rfl: 3   Probiotic Product (ALIGN PO), Take 1 capsule by mouth daily. , Disp: , Rfl:    RABEprazole (ACIPHEX) 20 MG tablet, TAKE 1 TABLET(20 MG) BY MOUTH TWICE DAILY (Patient not taking: Reported on 06/29/2022), Disp: 60 tablet, Rfl: 1   rosuvastatin (CRESTOR) 10 MG tablet, TAKE 1 TABLET BY MOUTH DAILY, Disp: 90 tablet, Rfl: 3   solifenacin (VESICARE) 5 MG tablet, TAKE 1 TABLET BY MOUTH DAILY, Disp: 90 tablet, Rfl: 3   tirzepatide (MOUNJARO) 5 MG/0.5ML Pen, Inject 5 mg into the skin once a week. (Patient not taking: Reported on 09/20/2022), Disp: 6 mL, Rfl: 11   venlafaxine  XR (EFFEXOR-XR) 75 MG 24 hr capsule, TAKE 3 CAPSULES BY MOUTH DAILY  WITH BREAKFAST, Disp: 270 capsule, Rfl: 3  Observations/Objective: Patient is well-developed, well-nourished in no acute distress.  Resting comfortably  at home.  Head is normocephalic, atraumatic.  No labored breathing.  Speech is clear and coherent with logical content.  Patient is alert and oriented at baseline.    Assessment and Plan:  1. Increased frequency of urination  Due to the need to rule out UTI vs Prostate Concerns Vs DM  He is advised to be seen in person- he is going to call his PCP and if he can not be seen there he will go to an UC.  Patient acknowledged agreement and understanding of the plan.     Follow Up Instructions: I discussed the assessment and treatment plan with the patient. The patient was provided an opportunity to ask questions and all were answered. The patient agreed  with the plan and demonstrated an understanding of the instructions.  A copy of instructions were sent to the patient via MyChart unless otherwise noted below.     The patient was advised to call back or seek an in-person evaluation if the symptoms worsen or if the condition fails to improve as anticipated.  Time:  I spent 3 minutes with the patient via telehealth technology discussing the above problems/concerns.    Freddy Finner, NP

## 2022-09-26 NOTE — Assessment & Plan Note (Signed)
Lab Results  Component Value Date   VITAMINB12 185 (L) 06/29/2022   Low, to start oral replacement - b12 1000 mcg qd

## 2022-09-26 NOTE — Patient Instructions (Signed)
Please call your PCP as discussed or go to the nearest Urgent care.

## 2022-09-26 NOTE — Progress Notes (Signed)
Patient ID: Gregory Owens, male   DOB: [birth year], 52 y.o.   MRN: [id number]        Chief Complaint: follow up dm, low b12, hld, low vit d       HPI:  Gregory Owens is a 52 y.o. male here after onset GI discomfort and distress with the mounjaro, and stopped his metformin and mounjaro one month ago.  Last A1c 8.1 prior to mounjaro start on metformin.  Now with much worsening polydipsia, polyuria.  Pt denies chest pain, increased sob or doe, wheezing, orthopnea, PND, increased LE swelling, palpitations, dizziness or syncope.   Pt denies polydipsia, polyuria, or new focal neuro s/s.    Pt denies fever, wt loss, night sweats, loss of appetite, or other constitutional symptoms          Wt Readings from Last 3 Encounters:  09/26/22 262 lb (118.8 kg)  09/20/22 267 lb (121.1 kg)  08/23/22 260 lb (117.9 kg)   BP Readings from Last 3 Encounters:  09/26/22 122/78  09/20/22 130/84  08/23/22 110/74         Past Medical History:  Diagnosis Date   Abnormal EKG    NS ST-T EKG changes with negative Stress cardiolite 2003   Anxiety    Dehydration 01/30/2003   [place] , [place]   Diabetes mellitus without complication (HCC)    GERD (gastroesophageal reflux disease)    Hyperglycemia    Hyperlipidemia    IBS (irritable bowel syndrome)    Obesity    OSA (obstructive sleep apnea)    resolved post surgery   Past Surgical History:  Procedure Laterality Date   CHOLECYSTECTOMY N/A 06/07/2019   Procedure: LAPAROSCOPIC CHOLECYSTECTOMY;  Surgeon: Andria Meuse, MD;  Location: [facility] OR;  Service: General;  Laterality: N/A;   COLONOSCOPY     TONSILLECTOMY AND ADENOIDECTOMY      with above   UVULOPALATOPHARYNGOPLASTY  01/29/2002   Post op bleeding complication;  Dr Marinda Elk TOOTH EXTRACTION      reports that he has never smoked. He has never used smokeless tobacco. He reports that he does not drink alcohol and does not use drugs. family history includes Cirrhosis in his  brother; Diabetes in his maternal grandmother and paternal grandmother; Heart attack (age of onset: 3) in his maternal grandmother and paternal grandmother; Hyperlipidemia in his father and mother; Hypertension in his father and mother; Other in his mother; Prostate cancer in his paternal grandfather. Allergies  Allergen Reactions   Clomiphene Citrate     Other reaction(s): LFT elevation of  AST/ALT into 500's   Sulfa Antibiotics Other (See Comments)    Patient is not sure what reaction.    Current Outpatient Medications on File Prior to Visit  Medication Sig Dispense Refill   cetirizine (ZYRTEC) 10 MG tablet Take 10 mg by mouth daily.     Continuous Blood Gluc Receiver (FREESTYLE LIBRE READER) DEVI Use as directed daily continuos E11.9 1 each 0   Continuous Blood Gluc Sensor (FREESTYLE LIBRE SENSOR SYSTEM) MISC Use as directed continous daily every 14 days E11.9 6 each 3   glucose blood test strip Use as instructed twice a day E 11.9 Contour Next Strips 100 each 2   hyoscyamine (LEVSIN SL) 0.125 MG SL tablet Take 1 tablet (0.125 mg total) by mouth every 8 (eight) hours as needed. 360 tablet 1   ketoconazole (NIZORAL) 2 % shampoo Apply 1 Application topically 2 (two) times a week. 120 mL 6  Probiotic Product (ALIGN PO) Take 1 capsule by mouth daily.      No current facility-administered medications on file prior to visit.        ROS:  All others reviewed and negative.  Objective        PE:  BP 122/78 (BP Location: Right Arm, Patient Position: Sitting, Cuff Size: Normal)   Pulse (!) 110   Temp 98.5 F (36.9 C) (Oral)   Ht 5\' 8"  (1.727 m)   Wt 262 lb (118.8 kg)   SpO2 97%   BMI 39.84 kg/m                 Constitutional: Pt appears in NAD               HENT: Head: NCAT.                Right Ear: External ear normal.                 Left Ear: External ear normal.                Eyes: . Pupils are equal, round, and reactive to light. Conjunctivae and EOM are normal                Nose: without d/c or deformity               Neck: Neck supple. Gross normal ROM               Cardiovascular: Normal rate and regular rhythm.                 Pulmonary/Chest: Effort normal and breath sounds without rales or wheezing.                Abd:  Soft, NT, ND, + BS, no organomegaly               Neurological: Pt is alert. At baseline orientation, motor grossly intact               Skin: Skin is warm. No rashes, no other new lesions, LE edema - none               Psychiatric: Pt behavior is normal without agitation   Micro: none  Cardiac tracings I have personally interpreted today:  none  Pertinent Radiological findings (summarize): none   Lab Results  Component Value Date   WBC 8.4 06/29/2022   HGB 13.9 06/29/2022   HCT 42.2 06/29/2022   PLT 278.0 06/29/2022   GLUCOSE 255 (H) 06/29/2022   CHOL 152 06/29/2022   TRIG 125.0 06/29/2022   HDL 41.80 06/29/2022   LDLDIRECT 53.0 08/29/2017   LDLCALC 85 06/29/2022   ALT 9 06/29/2022   AST 11 06/29/2022   NA 138 06/29/2022   K 3.7 06/29/2022   CL 100 06/29/2022   CREATININE 0.94 06/29/2022   BUN 12 06/29/2022   CO2 30 06/29/2022   TSH 1.38 06/29/2022   PSA 0.63 06/29/2022   HGBA1C 8.1 (H) 06/29/2022   MICROALBUR 1.8 06/29/2022   Assessment/Plan:  Gregory Owens is a 52 y.o. Black or African American [2] male with  has a past medical history of Abnormal EKG, Anxiety, Dehydration (01/30/2003), Diabetes mellitus without complication (HCC), GERD (gastroesophageal reflux disease), Hyperglycemia, Hyperlipidemia, IBS (irritable bowel syndrome), Obesity, and OSA (obstructive sleep apnea).  B12 deficiency Lab Results  Component Value Date   VITAMINB12 185 (L) 06/29/2022   Low, to start  oral replacement - b12 1000 mcg qd   Diabetes (HCC) I susepct much worsening dm hyperglycemia after pt stopped his metfomrin and mounjaro; ok to restart the meformin, and change the mounjaro to ozempic 0.25 mg weekly, with attention  to increase in 1 mo if tolerates ok  Hyperlipidemia Lab Results  Component Value Date   LDLCALC 85 06/29/2022   Uncontrolled, goal ldl < 70,, pt to continue current statin crestor 10 mg  declines other change for now    Vitamin D deficiency Last vitamin D Lab Results  Component Value Date   VD25OH 16.33 (L) 06/29/2022   Low, to start oral replacement  Followup: Return in about 4 months (around 01/26/2023).  Oliver Barre, MD 09/26/2022 9:15 PM [facility]

## 2022-09-27 ENCOUNTER — Encounter: Payer: Self-pay | Admitting: Internal Medicine

## 2022-09-27 LAB — URINALYSIS, ROUTINE W REFLEX MICROSCOPIC
Bilirubin Urine: NEGATIVE
Hgb urine dipstick: NEGATIVE
Ketones, ur: NEGATIVE
Leukocytes,Ua: NEGATIVE
Nitrite: NEGATIVE
RBC / HPF: NONE SEEN (ref 0–?)
Specific Gravity, Urine: 1.01 (ref 1.000–1.030)
Total Protein, Urine: NEGATIVE
Urine Glucose: >=1000 — AB
Urobilinogen, UA: 0.2 (ref 0.0–1.0)
WBC, UA: NONE SEEN (ref 0–?)
pH: 6 (ref 5.0–8.0)

## 2022-09-28 MED ORDER — HYDROCORTISONE 2.5 % EX CREA: TOPICAL_CREAM | Freq: Two times a day (BID) | CUTANEOUS | 0 refills | Status: DC

## 2022-10-03 ENCOUNTER — Telehealth: Payer: Self-pay

## 2022-10-03 ENCOUNTER — Other Ambulatory Visit (HOSPITAL_COMMUNITY): Payer: Self-pay

## 2022-10-03 NOTE — Telephone Encounter (Signed)
Pharmacy Patient Advocate Encounter   Received notification from Patient Advice Request messages that prior authorization for Ozempic is required/requested.   Insurance verification completed.   The patient is insured through [facility] .   Per test claim: Refill too soon. PA is not needed at this time. Medication was filled 10/01/22. Next eligible fill date is 10/24/22.   Called pharmacy, prescription is ready for pick up. Copay is $24.99

## 2022-10-30 LAB — HM DIABETES EYE EXAM

## 2022-11-01 ENCOUNTER — Other Ambulatory Visit: Payer: Self-pay | Admitting: Nurse Practitioner

## 2022-11-05 NOTE — Telephone Encounter (Signed)
Contacted pt & Left voicemail

## 2022-11-09 NOTE — Telephone Encounter (Signed)
Attempted to contact patient and was unable to leave a voicemail.

## 2022-11-28 ENCOUNTER — Encounter: Payer: Self-pay | Admitting: Internal Medicine

## 2022-11-29 NOTE — Telephone Encounter (Signed)
Spoke with Pharmacist and gave Dx code [phone]

## 2023-02-12 ENCOUNTER — Telehealth: Payer: No Typology Code available for payment source | Admitting: Physician Assistant

## 2023-02-12 ENCOUNTER — Ambulatory Visit
Admission: RE | Admit: 2023-02-12 | Discharge: 2023-02-12 | Disposition: A | Discharge status: Home / Self Care | Payer: No Typology Code available for payment source | Source: Ambulatory Visit

## 2023-02-12 VITALS — BP 109/65 | HR 118 | Temp 98.3°F | Resp 22

## 2023-02-12 DIAGNOSIS — N4889 Other specified disorders of penis: Secondary | ICD-10-CM

## 2023-02-12 DIAGNOSIS — B3742 Candidal balanitis: Secondary | ICD-10-CM | POA: Diagnosis not present

## 2023-02-12 DIAGNOSIS — E11628 Type 2 diabetes mellitus with other skin complications: Secondary | ICD-10-CM

## 2023-02-12 DIAGNOSIS — L089 Local infection of the skin and subcutaneous tissue, unspecified: Secondary | ICD-10-CM | POA: Diagnosis not present

## 2023-02-12 MED ORDER — CLOTRIMAZOLE 1 % EX CREA
TOPICAL_CREAM | CUTANEOUS | 0 refills | Status: DC
Start: 2023-02-12 — End: 2023-03-26

## 2023-02-12 MED ORDER — AMOXICILLIN-POT CLAVULANATE 875-125 MG PO TABS: 1.0000 | ORAL_TABLET | Freq: Two times a day (BID) | ORAL | 0 refills | Status: DC

## 2023-02-12 NOTE — ED Triage Notes (Signed)
 P presents with urinary frequency and having incontinence from time to time. States he has irritation on penis.

## 2023-02-12 NOTE — Progress Notes (Signed)
  Because of the irritation of the skin which could indicate either a yeast or bacterial infection, which needs to be differentiated so proper treatment is given, I feel your condition warrants further evaluation and I recommend that you be seen in a face-to-face visit.   NOTE: There will be NO CHARGE for this E-Visit   If you are having a true medical emergency, please call 911.     For an urgent face to face visit, [name] has multiple urgent care centers for your convenience.  Click the link below for the full list of locations and hours, walk-in wait times, appointment scheduling options and driving directions:  Urgent Care - [place], [place], [place], [place], [place], [place]  [place]     Your MyChart E-visit questionnaire answers were reviewed by a board certified advanced clinical practitioner to complete your personal care plan based on your specific symptoms.    Thank you for using e-Visits.

## 2023-02-12 NOTE — ED Provider Notes (Signed)
 [facility]    CSN: [id number] Arrival date & time: 02/12/23  1440      History   Chief Complaint Chief Complaint  Patient presents with   Urinary Frequency    I have experience incontinence from time to time.  My penis skin is irritated.  Two weeks. - Entered by patient    HPI Gregory Owens is a 53 y.o. male.   Gregory Owens is a 53 y.o. male presenting for chief complaint of penile irritation that started a few days ago. History of type 2 diabetes and intermittent urinary incontinence. He takes vesicare  for urinary incontinence but did not take it for a few days causing increased urination. Admits to sometimes improper cleaning of the penile region and reports pain and irritation with redness/swelling started a few days after forgetting to take Vesicare . Denies concern for STD, penile discharge, dysuria, urinary hesitancy, fevers, chills, abdominal pain, nausea, vomiting, diarrhea, and abdominal pain. Unsure of rash to the area. He has been applying neosporin to the penile head without relief. Type 2 diabetic, denies recent steroid/antibiotic use. States sugars are normal and he is taking medications as prescribed for diabetes.    Urinary Frequency    Past Medical History:  Diagnosis Date   Abnormal EKG    NS ST-T EKG changes with negative Stress cardiolite 2003   Anxiety    Dehydration 01/30/2003   [place] , [place]   Diabetes mellitus without complication (HCC)    GERD (gastroesophageal reflux disease)    Hyperglycemia    Hyperlipidemia    IBS (irritable bowel syndrome)    Obesity    OSA (obstructive sleep apnea)    resolved post surgery    Patient Active Problem List   Diagnosis Date Noted   B12 deficiency 06/30/2022   Other fatigue 06/30/2022   History of colonic polyps 06/30/2022   Non-recurrent acute serous otitis media of left ear 01/25/2022   Right knee pain 01/06/2022   Abdominal pain, generalized 08/02/2021   Abnormal LFTs  02/17/2021   Abnormal serum lipase level 02/17/2021   Dark urine 12/16/2020   Body mass index (BMI) 38.0-38.9, adult 03/29/2020   Hyperglycemia due to type 2 diabetes mellitus (HCC) 03/29/2020   Metabolic syndrome 03/29/2020   Nonalcoholic steatohepatitis (NASH) 03/29/2020   Vitamin D  deficiency 03/29/2020   Low back pain 03/29/2020   Right flank pain, chronic 02/08/2020   Right lower quadrant abdominal pain 12/20/2019   Hematochezia 07/09/2019   Cholecystitis 06/07/2019   S/P laparoscopic cholecystectomy 06/07/2019   Headache 02/08/2019   Urinary urgency 11/12/2018   Hypogonadism in male 09/04/2017   Bilateral foot pain 03/08/2017   Dysphagia 08/29/2016   ADHD 03/28/2016   Morbid obesity (HCC) 12/06/2015   Achrochordon 10/11/2015   Seborrheic dermatitis 10/11/2015   Encounter for well adult exam with abnormal findings 03/10/2015   Skin lesion 03/10/2015   GERD (gastroesophageal reflux disease) 11/04/2013   Tachycardia 11/04/2013   Nonspecific abnormal electrocardiogram (ECG) (EKG) 11/04/2013   Non-compliant behavior 10/14/2013   Diabetes (HCC) 08/19/2012   PLMD (periodic limb movement disorder) 05/30/2012   SLEEP DISORDER 12/05/2009   Hyperlipidemia 08/25/2009   GAD (generalized anxiety disorder) 08/25/2009   Obstructive sleep apnea 08/25/2009   RHINITIS 05/24/2009    Past Surgical History:  Procedure Laterality Date   CHOLECYSTECTOMY N/A 06/07/2019   Procedure: LAPAROSCOPIC CHOLECYSTECTOMY;  Surgeon: Teresa Lonni HERO, MD;  Location: [facility] OR;  Service: General;  Laterality: N/A;   COLONOSCOPY     TONSILLECTOMY  AND ADENOIDECTOMY      with above   UVULOPALATOPHARYNGOPLASTY  01/29/2002   Post op bleeding complication;  Dr Roark QUERY TOOTH EXTRACTION         Home Medications    Prior to Admission medications   Medication Sig Start Date End Date Taking? Authorizing Provider  amoxicillin -clavulanate (AUGMENTIN ) 875-125 MG tablet Take 1 tablet by mouth every  12 (twelve) hours. 02/12/23  Yes Enedelia Dorna HERO, FNP  clotrimazole  (LOTRIMIN ) 1 % cream Apply to affected area 2 times daily 02/12/23  Yes Seleni Meller M, FNP  guanFACINE (INTUNIV) 2 MG TB24 ER tablet Take 2 mg by mouth daily. 01/31/23  Yes [provider]  lisdexamfetamine (VYVANSE) 70 MG capsule Take 70 mg by mouth every morning. 01/31/23  Yes [provider]  cetirizine (ZYRTEC) 10 MG tablet Take 10 mg by mouth daily.    [provider]  Continuous Blood Gluc Receiver (FREESTYLE LIBRE READER) DEVI Use as directed daily continuos E11.9 09/12/20   Norleen Lynwood ORN, MD  Continuous Blood Gluc Sensor (FREESTYLE LIBRE SENSOR SYSTEM) MISC Use as directed continous daily every 14 days E11.9 09/18/21   Norleen Lynwood ORN, MD  glucose blood test strip Use as instructed twice a day E 11.9 Contour Next Strips 05/25/19   Norleen Lynwood ORN, MD  hydrocortisone  2.5 % cream Apply topically 2 (two) times daily. 09/28/22   Norleen Lynwood ORN, MD  hyoscyamine  (LEVSIN  SL) 0.125 MG SL tablet Take 1 tablet (0.125 mg total) by mouth every 8 (eight) hours as needed. 06/19/22   Kennedy-Smith, Colleen M, NP  ketoconazole  (NIZORAL ) 2 % shampoo Apply 1 Application topically 2 (two) times a week. 02/01/22   Norleen Lynwood ORN, MD  losartan  (COZAAR ) 25 MG tablet TAKE 1 TABLET BY MOUTH  DAILY 09/26/22   Norleen Lynwood ORN, MD  metFORMIN  (GLUCOPHAGE -XR) 500 MG 24 hr tablet TAKE 4 TABLETS BY MOUTH ONCE DAILY WITH BREAKFAST 09/26/22   Norleen Lynwood ORN, MD  Probiotic Product (ALIGN PO) Take 1 capsule by mouth daily.     [provider]  rosuvastatin  (CRESTOR ) 10 MG tablet Take 1 tablet (10 mg total) by mouth daily. 09/26/22   Norleen Lynwood ORN, MD  Semaglutide ,0.25 or 0.5MG /DOS, (OZEMPIC , 0.25 OR 0.5 MG/DOSE,) 2 MG/3ML SOPN Take 0.25 mg subcutaneous once weekly 09/26/22   Norleen Lynwood ORN, MD  solifenacin  (VESICARE ) 5 MG tablet Take 1 tablet (5 mg total) by mouth daily. 09/26/22   Norleen Lynwood ORN, MD  venlafaxine  XR (EFFEXOR -XR) 75 MG 24  hr capsule Take 3 capsules (225 mg total) by mouth daily with breakfast. 09/26/22   Norleen Lynwood ORN, MD    Family History Family History  Problem Relation Age of Onset   Hyperlipidemia Mother    Hypertension Mother    Other Mother        Inflammatory Occipital Lymphadenitis   Hypertension Father    Hyperlipidemia Father    Cirrhosis Brother        autoimmune hepatitis (twin)   Diabetes Maternal Grandmother    Heart attack Maternal Grandmother 73   Diabetes Paternal Grandmother    Heart attack Paternal Grandmother 72   Prostate cancer Paternal Grandfather    Stomach cancer Neg Hx    Esophageal cancer Neg Hx    Rectal cancer Neg Hx    Colon cancer Neg Hx     Social History Social History   Tobacco Use   Smoking status: Never   Smokeless tobacco: Never  Vaping  Use   Vaping status: Never Used  Substance Use Topics   Alcohol use: No   Drug use: No     Allergies   Clomiphene citrate and Sulfa antibiotics   Review of Systems Review of Systems  Genitourinary:  Positive for frequency.  Per HPI   Physical Exam Triage Vital Signs ED Triage Vitals  Encounter Vitals Group     BP 02/12/23 1459 109/65     Systolic BP Percentile --      Diastolic BP Percentile --      Pulse Rate 02/12/23 1459 (!) 118     Resp 02/12/23 1507 (!) 22     Temp 02/12/23 1459 98.3 F (36.8 C)     Temp Source 02/12/23 1459 Oral     SpO2 02/12/23 1459 94 %     Weight --      Height --      Head Circumference --      Peak Flow --      Pain Score 02/12/23 1500 6     Pain Loc --      Pain Education --      Exclude from Growth Chart --    No data found.  Updated Vital Signs BP 109/65 (BP Location: Right Arm)   Pulse (!) 118   Temp 98.3 F (36.8 C) (Oral)   Resp (!) 22   SpO2 94%   Visual Acuity Right Eye Distance:   Left Eye Distance:   Bilateral Distance:    Right Eye Near:   Left Eye Near:    Bilateral Near:     Physical Exam Vitals and nursing note reviewed. Exam  conducted with a chaperone present Sidra, RN present for exam).  Constitutional:      Appearance: He is not ill-appearing or toxic-appearing.  HENT:     Head: Normocephalic and atraumatic.     Right Ear: Hearing and external ear normal.     Left Ear: Hearing and external ear normal.     Nose: Nose normal.     Mouth/Throat:     Lips: Pink.  Eyes:     General: Lids are normal. Vision grossly intact. Gaze aligned appropriately.     Extraocular Movements: Extraocular movements intact.     Conjunctiva/sclera: Conjunctivae normal.  Pulmonary:     Effort: Pulmonary effort is normal.  Genitourinary:    Pubic Area: No rash.      Penis: Uncircumcised. Erythema, tenderness and swelling present. No phimosis or paraphimosis.      Testes: Normal.     Epididymis:     Right: Normal.     Left: Normal.     Comments: Erythema, swelling, and tenderness to foreskin and penile shaft/glans. 1-2 pustular appearing lesions to the glans penis and erythematous maculopapular rash. Able to fully retract the foreskin for inspection.  Musculoskeletal:     Cervical back: Neck supple.  Lymphadenopathy:     Lower Body: Right inguinal adenopathy present. Left inguinal adenopathy present.  Skin:    General: Skin is warm and dry.     Capillary Refill: Capillary refill takes less than 2 seconds.     Findings: No rash.  Neurological:     General: No focal deficit present.     Mental Status: He is alert and oriented to person, place, and time. Mental status is at baseline.     Cranial Nerves: No dysarthria or facial asymmetry.  Psychiatric:        Mood and Affect: Mood normal.  Speech: Speech normal.        Behavior: Behavior normal.        Thought Content: Thought content normal.        Judgment: Judgment normal.      UC Treatments / Results  Labs (all labs ordered are listed, but only abnormal results are displayed) Labs Reviewed - No data to display  EKG   Radiology No results  found.  Procedures Procedures (including critical care time)  Medications Ordered in UC Medications - No data to display  Initial Impression / Assessment and Plan / UC Course  I have reviewed the triage vital signs and the nursing notes.  Pertinent labs & imaging results that were available during my care of the patient were reviewed by me and considered in my medical decision making (see chart for details).   1. Candidal balanitis, type 2 diabetes, pustular lesion Evaluation suggests candidal balanitis with secondary bacterial infection. Augmentin  BID for 7 days to treat secondary bacterial infection. Clotrimazole  topically BID for 7-14 days. Discussed hygiene and appropriate cleaning/application of clotrimazole  as well as methods to prevent future infections. Low suspicion for UTI/STD, therefore deferred testing. HR elevated today at 118 and at baseline it appears at other visits. He does not appear dehydrated and is well-appearing without systemic signs of infection. I'd like to prescribed diflucan, though clomiphene citrate is documented as an allergy (causes high LFTs) and is an ingredient in diflucan. Will defer this for now and try topical clotrimazole  instead with antibiotic.   Counseled patient on potential for adverse effects with medications prescribed/recommended today, strict ER and return-to-clinic precautions discussed, patient verbalized understanding.    Final Clinical Impressions(s) / UC Diagnoses   Final diagnoses:  Candidal balanitis  Type 2 diabetes mellitus with other skin complication, without long-term current use of insulin  (HCC)  Penile lesion     Discharge Instructions      You have a yeast infection of the penis.  Take augmentin  antibiotic every 12 hours for the next 7 days to treat secondary bacterial infection to penis.   Apply clotrimazole  ointment once daily at bedtime after shower by retracting the foreskin and then applying a thin layer of  the ointment.  Allow the foreskin to return to normal placement after applying ointment.   If you develop any new or worsening symptoms or do not improve in the next 2 to 3 days, please return.  If your symptoms are severe, please go to the emergency room.  Follow-up with your primary care provider for further evaluation and management of your symptoms as well as ongoing wellness visits.  I hope you feel better!    ED Prescriptions     Medication Sig Dispense Auth. Provider   clotrimazole  (LOTRIMIN ) 1 % cream Apply to affected area 2 times daily 15 g Tequilla Cousineau M, FNP   amoxicillin -clavulanate (AUGMENTIN ) 875-125 MG tablet Take 1 tablet by mouth every 12 (twelve) hours. 14 tablet Enedelia Dorna HERO, FNP      PDMP not reviewed this encounter.   Enedelia Dorna HERO, [place] 02/12/23 [phone]

## 2023-02-12 NOTE — Discharge Instructions (Addendum)
 You have a yeast infection of the penis.  Take augmentin  antibiotic every 12 hours for the next 7 days to treat secondary bacterial infection to penis.   Apply clotrimazole  ointment once daily at bedtime after shower by retracting the foreskin and then applying a thin layer of the ointment.  Allow the foreskin to return to normal placement after applying ointment.   If you develop any new or worsening symptoms or do not improve in the next 2 to 3 days, please return.  If your symptoms are severe, please go to the emergency room.  Follow-up with your primary care provider for further evaluation and management of your symptoms as well as ongoing wellness visits.  I hope you feel better!

## 2023-02-22 NOTE — Progress Notes (Unsigned)
   Rubin Payor, PhD, LAT, ATC acting as a scribe for [name], MD.  Gregory Owens is a 53 y.o. male who presents to [facility] today for exacerbation of his R knee pain. Pt was last seen by Dr. Denyse Amass on 09/20/22 and was given a R knee steroid injection.  Today, pt reports R knee pain returned a few wks ago, progressively worsening. He pushed back his prior f/u because the knee was feeling pretty good.   Dx imaging: 01/03/22 R knee XR   Pertinent review of systems: No fevers or chills  Relevant historical information: Diabetes    Exam:  BP 132/86   Pulse (!) 118   Ht 5\' 8"  (1.727 m)   Wt 253 lb (114.8 kg)   SpO2 97%   BMI 38.47 kg/m  General: Well Developed, well nourished, and in no acute distress.   MSK: Right knee: Normal-appearing Mild effusion. Normal motion. Tender palpation joint line.    Lab and Radiology Results  Procedure: Real-time Ultrasound Guided Injection of right knee joint superior lateral patella space Device: Philips Affiniti 50G/GE Logiq Images permanently stored and available for review in PACS Verbal informed consent obtained.  Discussed risks and benefits of procedure. Warned about infection, bleeding, hyperglycemia damage to structures among others. Patient expresses understanding and agreement Time-out conducted.   Noted no overlying erythema, induration, or other signs of local infection.   Skin prepped in a sterile fashion.   Local anesthesia: Topical Ethyl chloride.   With sterile technique and under real time ultrasound guidance: 40 mg of Kenalog and 2 mL of Marcaine injected into knee joint. Fluid seen entering the joint capsule.   Completed without difficulty   Pain immediately resolved suggesting accurate placement of the medication.   Advised to call if fevers/chills, erythema, induration, drainage, or persistent bleeding.   Images permanently stored and available for review in the  ultrasound unit.  Impression: Technically successful ultrasound guided injection.        Assessment and Plan: 53 y.o. male with chronic right knee pain due to exacerbation of DJD.  Previous injection was in August.  He has done quite well since then.  Plan for recheck as needed in the future.  Consider repeat steroid injection at recheck if needed.  Check back as needed.   PDMP not reviewed this encounter. Orders Placed This Encounter  Procedures   [place] LIMITED JOINT SPACE STRUCTURES LOW RIGHT(NO LINKED CHARGES)    Reason for Exam (SYMPTOM  OR DIAGNOSIS REQUIRED):   right knee pain    Preferred imaging location?:   [facility]   No orders of the defined types were placed in this encounter.    Discussed warning signs or symptoms. Please see discharge instructions. Patient expresses understanding.   The above documentation has been reviewed and is accurate and complete [name], M.D.

## 2023-02-25 ENCOUNTER — Other Ambulatory Visit: Payer: Self-pay

## 2023-02-25 ENCOUNTER — Encounter: Payer: Self-pay | Admitting: Internal Medicine

## 2023-02-25 ENCOUNTER — Other Ambulatory Visit: Payer: Self-pay | Admitting: Internal Medicine

## 2023-02-25 ENCOUNTER — Ambulatory Visit (INDEPENDENT_AMBULATORY_CARE_PROVIDER_SITE_OTHER): Payer: No Typology Code available for payment source | Admitting: Family Medicine

## 2023-02-25 VITALS — BP 132/86 | HR 118 | Ht 68.0 in | Wt 253.0 lb

## 2023-02-25 DIAGNOSIS — M25561 Pain in right knee: Secondary | ICD-10-CM

## 2023-02-25 DIAGNOSIS — G8929 Other chronic pain: Secondary | ICD-10-CM | POA: Diagnosis not present

## 2023-02-25 NOTE — Patient Instructions (Addendum)
Thank you for coming in today.   You received an injection today. Seek immediate medical attention if the joint becomes red, extremely painful, or is oozing fluid.   Please use Voltaren gel (Generic Diclofenac Gel) up to 4x daily for pain as needed.  This is available over-the-counter as both the name brand Voltaren gel and the generic diclofenac gel.   Check back as needed

## 2023-02-26 ENCOUNTER — Telehealth: Payer: No Typology Code available for payment source | Admitting: Physician Assistant

## 2023-02-26 ENCOUNTER — Ambulatory Visit: Payer: No Typology Code available for payment source

## 2023-02-26 DIAGNOSIS — R3989 Other symptoms and signs involving the genitourinary system: Secondary | ICD-10-CM

## 2023-02-26 DIAGNOSIS — N3281 Overactive bladder: Secondary | ICD-10-CM

## 2023-02-26 NOTE — Progress Notes (Signed)
Because of need to assess for possible UTI versus uncontrolled urinary urgency/incontinence, I feel your condition warrants further evaluation and I recommend that you be seen for a face to face visit.  Please contact your primary care physician practice to be seen. Many offices offer virtual options to be seen via video if you are not comfortable going in person to a medical facility at this time.  NOTE: You will NOT be charged for this eVisit.  If you do not have a PCP, [facility] offers a free physician referral service available at [phone]. Our trained staff has the experience, knowledge and resources to put you in touch with a physician who is right for you.    If you are having a true medical emergency please call 911.   Your e-visit answers were reviewed by a board certified advanced clinical practitioner to complete your personal care plan.  Thank you for using e-Visits.

## 2023-02-27 ENCOUNTER — Ambulatory Visit
Admission: RE | Admit: 2023-02-27 | Discharge: 2023-02-27 | Disposition: A | Discharge status: Home / Self Care | Payer: No Typology Code available for payment source | Source: Ambulatory Visit | Attending: Family Medicine | Admitting: Family Medicine

## 2023-02-27 ENCOUNTER — Encounter (HOSPITAL_BASED_OUTPATIENT_CLINIC_OR_DEPARTMENT_OTHER): Payer: Self-pay | Admitting: Emergency Medicine

## 2023-02-27 ENCOUNTER — Emergency Department (HOSPITAL_BASED_OUTPATIENT_CLINIC_OR_DEPARTMENT_OTHER)
Admission: EM | Admit: 2023-02-27 | Discharge: 2023-02-27 | Disposition: A | Discharge status: Home / Self Care | Payer: No Typology Code available for payment source | Attending: Emergency Medicine | Admitting: Emergency Medicine

## 2023-02-27 ENCOUNTER — Other Ambulatory Visit: Payer: Self-pay

## 2023-02-27 ENCOUNTER — Other Ambulatory Visit (HOSPITAL_COMMUNITY): Payer: Self-pay

## 2023-02-27 ENCOUNTER — Encounter: Payer: Self-pay | Admitting: Internal Medicine

## 2023-02-27 VITALS — BP 160/89 | HR 126 | Temp 98.0°F | Resp 16

## 2023-02-27 DIAGNOSIS — Z794 Long term (current) use of insulin: Secondary | ICD-10-CM | POA: Insufficient documentation

## 2023-02-27 DIAGNOSIS — Z7984 Long term (current) use of oral hypoglycemic drugs: Secondary | ICD-10-CM | POA: Insufficient documentation

## 2023-02-27 DIAGNOSIS — R300 Dysuria: Secondary | ICD-10-CM | POA: Diagnosis not present

## 2023-02-27 DIAGNOSIS — E860 Dehydration: Secondary | ICD-10-CM | POA: Insufficient documentation

## 2023-02-27 DIAGNOSIS — R739 Hyperglycemia, unspecified: Secondary | ICD-10-CM

## 2023-02-27 DIAGNOSIS — E1165 Type 2 diabetes mellitus with hyperglycemia: Secondary | ICD-10-CM | POA: Insufficient documentation

## 2023-02-27 DIAGNOSIS — R3589 Other polyuria: Secondary | ICD-10-CM | POA: Diagnosis present

## 2023-02-27 LAB — I-STAT VENOUS BLOOD GAS, ED
Acid-Base Excess: 1 mmol/L (ref 0.0–2.0)
Bicarbonate: 27.7 mmol/L (ref 20.0–28.0)
Calcium, Ion: 1.31 mmol/L (ref 1.15–1.40)
HCT: 46 % (ref 39.0–52.0)
Hemoglobin: 15.6 g/dL (ref 13.0–17.0)
O2 Saturation: 50 %
Potassium: 4.1 mmol/L (ref 3.5–5.1)
Sodium: 136 mmol/L (ref 135–145)
TCO2: 29 mmol/L (ref 22–32)
pCO2, Ven: 50.6 mm[Hg] (ref 44–60)
pH, Ven: 7.346 (ref 7.25–7.43)
pO2, Ven: 29 mm[Hg] — CL (ref 32–45)

## 2023-02-27 LAB — CBC
HCT: 45.1 % (ref 39.0–52.0)
Hemoglobin: 15 g/dL (ref 13.0–17.0)
MCH: 31.1 pg (ref 26.0–34.0)
MCHC: 33.3 g/dL (ref 30.0–36.0)
MCV: 93.6 fL (ref 80.0–100.0)
Platelets: 300 10*3/uL (ref 150–400)
RBC: 4.82 MIL/uL (ref 4.22–5.81)
RDW: 11.9 % (ref 11.5–15.5)
WBC: 18 10*3/uL — ABNORMAL HIGH (ref 4.0–10.5)
nRBC: 0 % (ref 0.0–0.2)

## 2023-02-27 LAB — URINALYSIS, ROUTINE W REFLEX MICROSCOPIC
Bilirubin Urine: NEGATIVE
Glucose, UA: >=500 mg/dL — AB
Hgb urine dipstick: NEGATIVE
Ketones, ur: NEGATIVE mg/dL
Leukocytes,Ua: NEGATIVE
Nitrite: NEGATIVE
Protein, ur: NEGATIVE mg/dL
Specific Gravity, Urine: 1.01 (ref 1.005–1.030)
pH: 5 (ref 5.0–8.0)

## 2023-02-27 LAB — POCT FASTING CBG KUC MANUAL ENTRY: POCT Glucose (KUC): 497 mg/dL — AB (ref 70–99)

## 2023-02-27 LAB — POCT URINALYSIS DIP (MANUAL ENTRY)
Bilirubin, UA: NEGATIVE
Glucose, UA: >=1000 mg/dL — AB
Ketones, POC UA: NEGATIVE mg/dL
Leukocytes, UA: NEGATIVE
Nitrite, UA: NEGATIVE
Protein Ur, POC: NEGATIVE mg/dL
Spec Grav, UA: 1.015 (ref 1.010–1.025)
Urobilinogen, UA: 0.2 U/dL
pH, UA: 5.5 (ref 5.0–8.0)

## 2023-02-27 LAB — BASIC METABOLIC PANEL
Anion gap: 10 (ref 5–15)
BUN: 18 mg/dL (ref 6–20)
CO2: 26 mmol/L (ref 22–32)
Calcium: 9.9 mg/dL (ref 8.9–10.3)
Chloride: 98 mmol/L (ref 98–111)
Creatinine, Ser: 0.96 mg/dL (ref 0.61–1.24)
GFR, Estimated: >60 mL/min (ref >60)
Glucose, Bld: 483 mg/dL — ABNORMAL HIGH (ref 70–99)
Potassium: 4.1 mmol/L (ref 3.5–5.1)
Sodium: 134 mmol/L — ABNORMAL LOW (ref 135–145)

## 2023-02-27 LAB — URINALYSIS, MICROSCOPIC (REFLEX)
RBC / HPF: NONE SEEN RBC/hpf (ref 0–5)
WBC, UA: NONE SEEN WBC/hpf (ref 0–5)

## 2023-02-27 LAB — CBG MONITORING, ED
Glucose-Capillary: 524 mg/dL (ref 70–99)
Glucose-Capillary: 301 mg/dL — ABNORMAL HIGH (ref 70–99)

## 2023-02-27 LAB — BETA-HYDROXYBUTYRIC ACID: Beta-Hydroxybutyric Acid: 0.15 mmol/L (ref 0.05–0.27)

## 2023-02-27 MED ORDER — INSULIN ASPART 100 UNIT/ML IJ SOLN
8.0000 [IU] | Freq: Once | INTRAMUSCULAR | Status: AC
  Administered 2023-02-27: 8 [IU] via SUBCUTANEOUS

## 2023-02-27 MED ORDER — SODIUM CHLORIDE 0.9 % IV BOLUS
1000.0000 mL | Freq: Once | INTRAVENOUS | Status: AC
  Administered 2023-02-27: 1000 mL via INTRAVENOUS

## 2023-02-27 NOTE — ED Notes (Signed)
I could not attach the result to the CBG order. His glucose was 497.

## 2023-02-27 NOTE — ED Triage Notes (Signed)
Seen at [facility] for urinary incontinence and urinary frequency, while there patient's heart rate was 140s and blood sugar was around 450. State he has not been taking his metformin. Only other symptoms is dry mouth .

## 2023-02-27 NOTE — ED Provider Notes (Signed)
[facility]    CSN: [id number] Arrival date & time: 02/27/23  9528      History   Chief Complaint Chief Complaint  Patient presents with   Urinary Frequency    Entered by patient    HPI Gregory Owens is a 53 y.o. male.   The history is provided by the patient.  Urinary Frequency Pertinent negatives include no chest pain and no shortness of breath.  Has been having issues with urinary incontinence and urinary frequency.  Takes medications for overactive bladder but he stopped taking it for several months restarted about a week ago.  He is a non-insulin-dependent diabetic has not been taking his Ozempic for several months..  Has not been monitoring his blood sugar at home.  He had orange juice this morning. Admits dry mouth and increased thirst. Chart reviewed he had a telehealth visit yesterday and it was recommended he get an in person evaluation.  Past Medical History:  Diagnosis Date   Abnormal EKG    NS ST-T EKG changes with negative Stress cardiolite 2003   Anxiety    Dehydration 01/30/2003   [place] , [place]   Diabetes mellitus without complication (HCC)    GERD (gastroesophageal reflux disease)    Hyperglycemia    Hyperlipidemia    IBS (irritable bowel syndrome)    Obesity    OSA (obstructive sleep apnea)    resolved post surgery    Patient Active Problem List   Diagnosis Date Noted   B12 deficiency 06/30/2022   Other fatigue 06/30/2022   History of colonic polyps 06/30/2022   Non-recurrent acute serous otitis media of left ear 01/25/2022   Right knee pain 01/06/2022   Abdominal pain, generalized 08/02/2021   Abnormal LFTs 02/17/2021   Abnormal serum lipase level 02/17/2021   Dark urine 12/16/2020   Body mass index (BMI) 38.0-38.9, adult 03/29/2020   Hyperglycemia due to type 2 diabetes mellitus (HCC) 03/29/2020   Metabolic syndrome 03/29/2020   Nonalcoholic steatohepatitis (NASH) 03/29/2020   Vitamin D deficiency 03/29/2020   Low  back pain 03/29/2020   Right flank pain, chronic 02/08/2020   Right lower quadrant abdominal pain 12/20/2019   Hematochezia 07/09/2019   Cholecystitis 06/07/2019   S/P laparoscopic cholecystectomy 06/07/2019   Headache 02/08/2019   Urinary urgency 11/12/2018   Hypogonadism in male 09/04/2017   Bilateral foot pain 03/08/2017   Dysphagia 08/29/2016   ADHD 03/28/2016   Morbid obesity (HCC) 12/06/2015   Achrochordon 10/11/2015   Seborrheic dermatitis 10/11/2015   Encounter for well adult exam with abnormal findings 03/10/2015   Skin lesion 03/10/2015   GERD (gastroesophageal reflux disease) 11/04/2013   Tachycardia 11/04/2013   Nonspecific abnormal electrocardiogram (ECG) (EKG) 11/04/2013   Non-compliant behavior 10/14/2013   Diabetes (HCC) 08/19/2012   PLMD (periodic limb movement disorder) 05/30/2012   SLEEP DISORDER 12/05/2009   Hyperlipidemia 08/25/2009   GAD (generalized anxiety disorder) 08/25/2009   Obstructive sleep apnea 08/25/2009   RHINITIS 05/24/2009    Past Surgical History:  Procedure Laterality Date   CHOLECYSTECTOMY N/A 06/07/2019   Procedure: LAPAROSCOPIC CHOLECYSTECTOMY;  Surgeon: Andria Meuse, MD;  Location: [facility] OR;  Service: General;  Laterality: N/A;   COLONOSCOPY     TONSILLECTOMY AND ADENOIDECTOMY      with above   UVULOPALATOPHARYNGOPLASTY  01/29/2002   Post op bleeding complication;  Dr Marinda Elk TOOTH EXTRACTION         Home Medications    Prior to Admission medications  Medication Sig Start Date End Date Taking? Authorizing Provider  cetirizine (ZYRTEC) 10 MG tablet Take 10 mg by mouth daily.    [provider]  clotrimazole (LOTRIMIN) 1 % cream Apply to affected area 2 times daily 02/12/23   Carlisle Beers, FNP  Continuous Blood Gluc Receiver (FREESTYLE LIBRE READER) DEVI Use as directed daily continuos E11.9 09/12/20   Corwin Levins, MD  Continuous Blood Gluc Sensor (FREESTYLE LIBRE SENSOR SYSTEM) MISC Use as  directed continous daily every 14 days E11.9 09/18/21   Corwin Levins, MD  glucose blood test strip Use as instructed twice a day E 11.9 Contour Next Strips 05/25/19   Corwin Levins, MD  guanFACINE (INTUNIV) 2 MG TB24 ER tablet Take 2 mg by mouth daily. 01/31/23   [provider]  hydrocortisone 2.5 % cream APPLY TOPICALLY TO THE AFFECTED AREA TWICE DAILY 02/25/23   Corwin Levins, MD  hyoscyamine (LEVSIN SL) 0.125 MG SL tablet Take 1 tablet (0.125 mg total) by mouth every 8 (eight) hours as needed. 06/19/22   Arnaldo Natal, NP  ketoconazole (NIZORAL) 2 % shampoo Apply 1 Application topically 2 (two) times a week. 02/01/22   Corwin Levins, MD  lisdexamfetamine (VYVANSE) 70 MG capsule Take 70 mg by mouth every morning. 01/31/23   [provider]  losartan (COZAAR) 25 MG tablet TAKE 1 TABLET BY MOUTH  DAILY 09/26/22   Corwin Levins, MD  metFORMIN (GLUCOPHAGE-XR) 500 MG 24 hr tablet TAKE 4 TABLETS BY MOUTH ONCE DAILY WITH BREAKFAST 09/26/22   Corwin Levins, MD  Probiotic Product (ALIGN PO) Take 1 capsule by mouth daily.     [provider]  rosuvastatin (CRESTOR) 10 MG tablet Take 1 tablet (10 mg total) by mouth daily. 09/26/22   Corwin Levins, MD  Semaglutide,0.25 or 0.5MG /DOS, (OZEMPIC, 0.25 OR 0.5 MG/DOSE,) 2 MG/3ML SOPN Take 0.25 mg subcutaneous once weekly 09/26/22   Corwin Levins, MD  solifenacin (VESICARE) 5 MG tablet Take 1 tablet (5 mg total) by mouth daily. 09/26/22   Corwin Levins, MD  venlafaxine XR (EFFEXOR-XR) 75 MG 24 hr capsule Take 3 capsules (225 mg total) by mouth daily with breakfast. 09/26/22   Corwin Levins, MD    Family History Family History  Problem Relation Age of Onset   Hyperlipidemia Mother    Hypertension Mother    Other Mother        Inflammatory Occipital Lymphadenitis   Hypertension Father    Hyperlipidemia Father    Cirrhosis Brother        autoimmune hepatitis (twin)   Diabetes Maternal Grandmother    Heart attack Maternal Grandmother  47   Diabetes Paternal Grandmother    Heart attack Paternal Grandmother 28   Prostate cancer Paternal Grandfather    Stomach cancer Neg Hx    Esophageal cancer Neg Hx    Rectal cancer Neg Hx    Colon cancer Neg Hx     Social History Social History   Tobacco Use   Smoking status: Never   Smokeless tobacco: Never  Vaping Use   Vaping status: Never Used  Substance Use Topics   Alcohol use: No   Drug use: No     Allergies   Clomiphene citrate and Sulfa antibiotics   Review of Systems Review of Systems  Respiratory:  Negative for shortness of breath.   Cardiovascular:  Negative for chest pain.  Gastrointestinal:  Negative for nausea and vomiting.  Genitourinary:  Positive for frequency. Negative for decreased urine volume.  Neurological:  Negative for dizziness.     Physical Exam Triage Vital Signs ED Triage Vitals  Encounter Vitals Group     BP 02/27/23 0943 (!) 160/89     Systolic BP Percentile --      Diastolic BP Percentile --      Pulse Rate 02/27/23 0943 (!) 126     Resp 02/27/23 0943 16     Temp 02/27/23 0943 98 F (36.7 C)     Temp Source 02/27/23 0943 Oral     SpO2 02/27/23 0943 96 %     Weight --      Height --      Head Circumference --      Peak Flow --      Pain Score 02/27/23 1001 0     Pain Loc --      Pain Education --      Exclude from Growth Chart --    No data found.  Updated Vital Signs BP (!) 160/89 (BP Location: Right Arm)   Pulse (!) 126   Temp 98 F (36.7 C) (Oral)   Resp 16   SpO2 96%   Visual Acuity Right Eye Distance:   Left Eye Distance:   Bilateral Distance:    Right Eye Near:   Left Eye Near:    Bilateral Near:     Physical Exam Constitutional:      Appearance: He is not ill-appearing.  HENT:     Head: Normocephalic.     Mouth/Throat:     Mouth: Mucous membranes are dry.  Eyes:     Conjunctiva/sclera: Conjunctivae normal.  Cardiovascular:     Rate and Rhythm: Regular rhythm. Tachycardia present.   Pulmonary:     Effort: Pulmonary effort is normal. No respiratory distress.  Skin:    General: Skin is warm.  Neurological:     Mental Status: He is alert and oriented to person, place, and time.      UC Treatments / Results  Labs (all labs ordered are listed, but only abnormal results are displayed) Labs Reviewed  POCT URINALYSIS DIP (MANUAL ENTRY) - Abnormal; Notable for the following components:      Result Value   Color, UA light yellow (*)    Glucose, UA >=1,000 (*)    Blood, UA trace-intact (*)    All other components within normal limits  POCT FASTING CBG [facility] MANUAL ENTRY - Abnormal; Notable for the following components:   POCT Glucose ([facility]) 497 (*)    All other components within normal limits    EKG   Radiology No results found.  Procedures Procedures (including critical care time)  Medications Ordered in UC Medications - No data to display  Initial Impression / Assessment and Plan / UC Course  I have reviewed the triage vital signs and the nursing notes.  Pertinent labs & imaging results that were available during my care of the patient were reviewed by me and considered in my medical decision making (see chart for details).     53 year old non-insulin-dependent diabetic with urinary frequency.  Mucous membranes and tachycardia on exam.  Point-of-care urine dipstick shows greater than 1000 glucose with trace RBCs otherwise negative no ketones.  His blood sugar was 497.  Recommend ED evaluation Assubel IV hydration Final Clinical Impressions(s) / UC Diagnoses   Final diagnoses:  Hyperglycemia     Discharge Instructions      Go to the emergency department for  further evaluation and treatment   ED Prescriptions   None    PDMP not reviewed this encounter.   Meliton Rattan, [place] 02/27/23 1019

## 2023-02-27 NOTE — ED Provider Notes (Signed)
[facility] Provider Note   CSN: [id number] Arrival date & time: 02/27/23  1048     History  Chief Complaint  Patient presents with   Hyperglycemia    Gregory Owens is a 53 y.o. male.  He has a history of diabetes.  He stopped his medication a few months ago.  More recently he has noticed increased thirst increased urination and he had a fungal infection of the penis.  He went to urgent care today where they found his blood sugar to be elevated and recommended he come here for further evaluation.  He denies any fevers chills chest pain abdominal pain vomiting diarrhea.  No blurry vision double vision.  He has reordered his medications and is willing to start them again.  The history is provided by the patient.  Hyperglycemia Blood sugar level PTA:  500 Severity:  Moderate Onset quality:  Gradual Progression:  Unchanged Diabetes status:  Controlled with oral medications Context: noncompliance   Relieved by:  None tried Associated symptoms: dehydration, dysuria, increased thirst and polyuria   Associated symptoms: no abdominal pain, no blurred vision, no chest pain, no fever, no shortness of breath and no vomiting        Home Medications Prior to Admission medications   Medication Sig Start Date End Date Taking? Authorizing Provider  cetirizine (ZYRTEC) 10 MG tablet Take 10 mg by mouth daily.    [provider]  clotrimazole (LOTRIMIN) 1 % cream Apply to affected area 2 times daily 02/12/23   Carlisle Beers, FNP  Continuous Blood Gluc Receiver (FREESTYLE LIBRE READER) DEVI Use as directed daily continuos E11.9 09/12/20   Corwin Levins, MD  Continuous Blood Gluc Sensor (FREESTYLE LIBRE SENSOR SYSTEM) MISC Use as directed continous daily every 14 days E11.9 09/18/21   Corwin Levins, MD  glucose blood test strip Use as instructed twice a day E 11.9 Contour Next Strips 05/25/19   Corwin Levins, MD  guanFACINE  (INTUNIV) 2 MG TB24 ER tablet Take 2 mg by mouth daily. 01/31/23   [provider]  hydrocortisone 2.5 % cream APPLY TOPICALLY TO THE AFFECTED AREA TWICE DAILY 02/25/23   Corwin Levins, MD  hyoscyamine (LEVSIN SL) 0.125 MG SL tablet Take 1 tablet (0.125 mg total) by mouth every 8 (eight) hours as needed. 06/19/22   Arnaldo Natal, NP  ketoconazole (NIZORAL) 2 % shampoo Apply 1 Application topically 2 (two) times a week. 02/01/22   Corwin Levins, MD  lisdexamfetamine (VYVANSE) 70 MG capsule Take 70 mg by mouth every morning. 01/31/23   [provider]  losartan (COZAAR) 25 MG tablet TAKE 1 TABLET BY MOUTH  DAILY 09/26/22   Corwin Levins, MD  metFORMIN (GLUCOPHAGE-XR) 500 MG 24 hr tablet TAKE 4 TABLETS BY MOUTH ONCE DAILY WITH BREAKFAST 09/26/22   Corwin Levins, MD  Probiotic Product (ALIGN PO) Take 1 capsule by mouth daily.     [provider]  rosuvastatin (CRESTOR) 10 MG tablet Take 1 tablet (10 mg total) by mouth daily. 09/26/22   Corwin Levins, MD  Semaglutide,0.25 or 0.5MG /DOS, (OZEMPIC, 0.25 OR 0.5 MG/DOSE,) 2 MG/3ML SOPN Take 0.25 mg subcutaneous once weekly 09/26/22   Corwin Levins, MD  solifenacin (VESICARE) 5 MG tablet Take 1 tablet (5 mg total) by mouth daily. 09/26/22   Corwin Levins, MD  venlafaxine XR (EFFEXOR-XR) 75 MG 24 hr capsule Take 3 capsules (225 mg total) by  mouth daily with breakfast. 09/26/22   Corwin Levins, MD      Allergies    Clomiphene citrate and Sulfa antibiotics    Review of Systems   Review of Systems  Constitutional:  Negative for fever.  Eyes:  Negative for blurred vision and visual disturbance.  Respiratory:  Negative for shortness of breath.   Cardiovascular:  Negative for chest pain.  Gastrointestinal:  Negative for abdominal pain and vomiting.  Endocrine: Positive for polydipsia and polyuria.  Genitourinary:  Positive for dysuria.    Physical Exam Updated Vital Signs BP (!) 158/88 (BP Location: Left Arm)   Pulse (!) 129    Temp 98.2 F (36.8 C) (Oral)   Resp 18   SpO2 98%  Physical Exam Vitals and nursing note reviewed.  Constitutional:      General: He is not in acute distress.    Appearance: Normal appearance. He is well-developed.  HENT:     Head: Normocephalic and atraumatic.  Eyes:     Conjunctiva/sclera: Conjunctivae normal.  Cardiovascular:     Rate and Rhythm: Normal rate and regular rhythm.     Heart sounds: No murmur heard. Pulmonary:     Effort: Pulmonary effort is normal. No respiratory distress.     Breath sounds: Normal breath sounds.  Abdominal:     Palpations: Abdomen is soft.     Tenderness: There is no abdominal tenderness. There is no guarding or rebound.  Musculoskeletal:        General: No deformity.     Cervical back: Neck supple.  Skin:    General: Skin is warm and dry.     Capillary Refill: Capillary refill takes less than 2 seconds.  Neurological:     General: No focal deficit present.     Mental Status: He is alert and oriented to person, place, and time.     Sensory: No sensory deficit.     Motor: No weakness.     ED Results / Procedures / Treatments   Labs (all labs ordered are listed, but only abnormal results are displayed) Labs Reviewed  BASIC METABOLIC PANEL - Abnormal; Notable for the following components:      Result Value   Sodium 134 (*)    Glucose, Bld 483 (*)    All other components within normal limits  CBC - Abnormal; Notable for the following components:   WBC 18.0 (*)    All other components within normal limits  URINALYSIS, ROUTINE W REFLEX MICROSCOPIC - Abnormal; Notable for the following components:   Glucose, UA >=500 (*)    All other components within normal limits  URINALYSIS, MICROSCOPIC (REFLEX) - Abnormal; Notable for the following components:   Bacteria, UA RARE (*)    All other components within normal limits  CBG MONITORING, ED - Abnormal; Notable for the following components:   Glucose-Capillary 524 (*)    All other  components within normal limits  I-STAT VENOUS BLOOD GAS, ED - Abnormal; Notable for the following components:   pO2, Ven 29 (*)    All other components within normal limits  CBG MONITORING, ED - Abnormal; Notable for the following components:   Glucose-Capillary 301 (*)    All other components within normal limits  BETA-HYDROXYBUTYRIC ACID  CBG MONITORING, ED    EKG None  Radiology No results found.  Procedures Procedures    Medications Ordered in ED Medications  sodium chloride 0.9 % bolus 1,000 mL (has no administration in time range)  insulin aspart (  novoLOG) injection 8 Units (has no administration in time range)    ED Course/ Medical Decision Making/ A&P Clinical Course as of 02/27/23 1712  Wed Feb 27, 2023  1321 Patient's labs showing elevated white count elevated glucose but no gap VBG with normal pH urinalysis without any ketones.  He has received IV fluids and subcu insulin.  Tachycardia trending down.  Will recheck blood sugar [MB]  1349 Blood sugar down to 301.  He is feeling little bit better tachycardia improved.  He will restart his diabetes medications tonight.  Recommended close follow-up with PCP and return instructions discussed [MB]    Clinical Course User Index [MB] Terrilee Files, MD                                 Medical Decision Making Amount and/or Complexity of Data Reviewed Labs: ordered.  Risk Prescription drug management.   This patient complains of elevated blood sugar, increased thirst and increased urination; this involves an extensive number of treatment Options and is a complaint that carries with it a high risk of complications and morbidity. The differential includes hyperglycemia, DKA, metabolic derangement, UTI  I ordered, reviewed and interpreted labs, which included CBC with elevated white count, chemistries elevated glucose normal gap, VBG normal pH, urinalysis without signs of infection I ordered medication IV fluids  subcu insulin and reviewed PMP when indicated. Additional history obtained from patient's significant other Previous records obtained and reviewed in epic including urgent care visit today and last week Cardiac monitoring reviewed, sinus rhythm Social determinants considered, no significant barriers Critical Interventions: None  After the interventions stated above, I reevaluated the patient and found patient to be fairly asymptomatic and blood sugar improving Admission and further testing considered, patient would benefit from close follow-up with PCP and reinstitution of his medications.  Return instructions discussed.         Final Clinical Impression(s) / ED Diagnoses Final diagnoses:  Hyperglycemia    Rx / DC Orders ED Discharge Orders     None         Terrilee Files, MD 02/27/23 1714

## 2023-02-27 NOTE — Discharge Instructions (Signed)
You were seen in the emergency department for elevated blood sugars and frequent thirst and urination.  Your blood sugar was significantly elevated.  You were given fluids and insulin with improvement.  Please restart your diabetes medications and follow-up with your primary care doctor.  Try to limit your soda intake.  Return to the emergency department if any worsening or concerning symptoms

## 2023-02-27 NOTE — ED Triage Notes (Addendum)
Pt seen here on 1/14, diagnosed with yeast infection and took antibiotics. Pt is still reporting incontinence. Pt denies discharge or unusual odors. Pt states he is a diabetic type II and has not been taking his medications or blood sugars like he should. Pt did state that last night he urinated approximately 8 times, felt like he could not empty his bladder completely.

## 2023-02-27 NOTE — Discharge Instructions (Signed)
Go to the emergency department for further evaluation and treatment.

## 2023-02-27 NOTE — ED Notes (Signed)
Patient is being discharged from the Urgent Care and sent to the Emergency Department via private vehicle . Per Marylene Land PA, patient is in need of higher level of care due to elevated blood sugars, HR of 126, urinary frequency, and dry mouth. Patient is aware and verbalizes understanding of plan of care.  Vitals:   02/27/23 0943  BP: (!) 160/89  Pulse: (!) 126  Resp: 16  Temp: 98 F (36.7 C)  SpO2: 96%

## 2023-02-28 ENCOUNTER — Telehealth: Payer: Self-pay

## 2023-02-28 ENCOUNTER — Other Ambulatory Visit (HOSPITAL_COMMUNITY): Payer: Self-pay

## 2023-02-28 NOTE — Telephone Encounter (Signed)
Pharmacy Patient Advocate Encounter   Received notification from  [facility] Portal that prior authorization for Ozempic (0.25 or 0.5 MG/DOSE) 2MG /3ML pen-injectors is required/requested.   Insurance verification completed.   The patient is insured through [facility] .   Per test claim: PA required; PA submitted to above mentioned insurance via CoverMyMeds Key/confirmation #/EOC  [id number] Status is pending

## 2023-02-28 NOTE — Telephone Encounter (Signed)
Pharmacy Patient Advocate Encounter  Received notification from [facility] that Prior Authorization for Ozempic (0.25 or 0.5 MG/DOSE) 2MG /3ML pen-injectors has been APPROVED from 02/28/23 to 02/28/24. Ran test claim, Copay is $24.99. This test claim was processed through [facility] copay amounts may vary at other pharmacies due to pharmacy/plan contracts, or as the patient moves through the different stages of their insurance plan.   PA #/Case ID/Reference #: [id number]  *left voicemail for [facility] pharmacy

## 2023-03-04 ENCOUNTER — Telehealth: Payer: Self-pay

## 2023-03-04 MED ORDER — FREESTYLE LIBRE 3 READER DEVI: 0 refills | Status: DC

## 2023-03-04 MED ORDER — FREESTYLE LIBRE 3 SENSOR MISC: 3 refills | Status: DC

## 2023-03-04 NOTE — Telephone Encounter (Signed)
 Message has been sent to PA team.

## 2023-03-05 ENCOUNTER — Other Ambulatory Visit (HOSPITAL_COMMUNITY): Payer: Self-pay

## 2023-03-05 ENCOUNTER — Telehealth: Payer: Self-pay

## 2023-03-05 NOTE — Telephone Encounter (Signed)
 Pharmacy Patient Advocate Encounter   Received notification from Pt Calls Messages that prior authorization for Freestyle Libre 3 sensor is required/requested.   Insurance verification completed.   The patient is insured through [facility] .   Per test claim: PA required; PA submitted to above mentioned insurance via CoverMyMeds Key/confirmation #/EOC [id number] Status is pending

## 2023-03-05 NOTE — Telephone Encounter (Signed)
 Pharmacy Patient Advocate Encounter   Received notification from Pt Calls Messages that prior authorization for [facility] 3 Reader device is required/requested.   Insurance verification completed.   The patient is insured through [facility] .   Per test claim: PA required; PA submitted to above mentioned insurance via CoverMyMeds Key/confirmation #/EOC [id number] Status is pending

## 2023-03-06 NOTE — Telephone Encounter (Signed)
 Pharmacy Patient Advocate Encounter   Received notification from [employer] that Prior Authorization for [facility] 3  has been DENIED.  See denial reason below. No denial letter attached in CMM. Will attach denial letter to Media tab once received.    PA #/Case ID/Reference #: [id number]

## 2023-03-12 NOTE — Telephone Encounter (Signed)
[facility]

## 2023-03-21 ENCOUNTER — Other Ambulatory Visit (HOSPITAL_COMMUNITY): Payer: Self-pay

## 2023-03-26 ENCOUNTER — Encounter: Payer: Self-pay | Admitting: Internal Medicine

## 2023-03-26 ENCOUNTER — Ambulatory Visit (INDEPENDENT_AMBULATORY_CARE_PROVIDER_SITE_OTHER): Payer: No Typology Code available for payment source | Admitting: Internal Medicine

## 2023-03-26 VITALS — BP 128/78 | HR 100 | Temp 98.3°F | Ht 68.0 in | Wt 250.0 lb

## 2023-03-26 DIAGNOSIS — E538 Deficiency of other specified B group vitamins: Secondary | ICD-10-CM | POA: Diagnosis not present

## 2023-03-26 DIAGNOSIS — E1165 Type 2 diabetes mellitus with hyperglycemia: Secondary | ICD-10-CM

## 2023-03-26 DIAGNOSIS — Z125 Encounter for screening for malignant neoplasm of prostate: Secondary | ICD-10-CM

## 2023-03-26 DIAGNOSIS — E559 Vitamin D deficiency, unspecified: Secondary | ICD-10-CM | POA: Diagnosis not present

## 2023-03-26 DIAGNOSIS — E7800 Pure hypercholesterolemia, unspecified: Secondary | ICD-10-CM | POA: Diagnosis not present

## 2023-03-26 DIAGNOSIS — Z0001 Encounter for general adult medical examination with abnormal findings: Secondary | ICD-10-CM

## 2023-03-26 DIAGNOSIS — Z7985 Long-term (current) use of injectable non-insulin antidiabetic drugs: Secondary | ICD-10-CM

## 2023-03-26 DIAGNOSIS — E291 Testicular hypofunction: Secondary | ICD-10-CM

## 2023-03-26 DIAGNOSIS — Z0000 Encounter for general adult medical examination without abnormal findings: Secondary | ICD-10-CM

## 2023-03-26 DIAGNOSIS — Z7984 Long term (current) use of oral hypoglycemic drugs: Secondary | ICD-10-CM

## 2023-03-26 NOTE — Patient Instructions (Signed)
 You had the Prevnar 20 pneumonia shot today  Ok to continue the ozempic at the 0.25 mg but call in 1 month for the 0.5 mg if you are taking this well  Please continue all other medications as before, and refills have been done if requested.  Please have the pharmacy call with any other refills you may need.  Please continue your efforts at being more active, low cholesterol diet, and weight control.  You are otherwise up to date with prevention measures today.  Please keep your appointments with your specialists as you may have planned  Please go to the LAB at the blood drawing area for the tests to be done  You will be contacted by phone if any changes need to be made immediately.  Otherwise, you will receive a letter about your results with an explanation, but please check with MyChart first.  Please make an Appointment to return in 6 months, or sooner if needed

## 2023-03-26 NOTE — Progress Notes (Signed)
 Patient ID: Gregory Owens, male   DOB: [birth year], 53 y.o.   MRN: [id number]         Chief Complaint:: wellness exam and Diabetes Management Plan (Would like test strips and sensor sent in )  , low b12, dm, hld, hypogonadism,        HPI:  Gregory Owens is a 53 y.o. male here for wellness exam; for prevnar 20 today, for tdap at pharmacy, o/w up to date                        Also seen at [facility] recently with groin yeast infection possibly related to hx of dm and stopping the metformin on his own for 2 mo, now back on med for the past 6 wks.  Has not been taking metformin recently for unclear reason, but willing to restart   Also taking the ozempic just started last wk and tolerating well.  Pt denies chest pain, increased sob or doe, wheezing, orthopnea, PND, increased LE swelling, palpitations, dizziness or syncope.   Pt denies polydipsia, polyuria, or new focal neuro s/s.    Pt denies fever, wt loss, night sweats, loss of appetite, or other constitutional symptoms .     Wt Readings from Last 3 Encounters:  03/26/23 250 lb (113.4 kg)  02/25/23 253 lb (114.8 kg)  09/26/22 262 lb (118.8 kg)   BP Readings from Last 3 Encounters:  03/26/23 128/78  02/27/23 131/85  02/27/23 (!) 160/89   Immunization History  Administered Date(s) Administered   Influenza Split 12/12/2010   Influenza Whole 11/11/2009   Influenza, Seasonal, Injecte, Preservative Fre 12/25/2011   Influenza,inj,Quad PF,6+ Mos 10/21/2012, 11/04/2013, 03/10/2015, 10/10/2016, 10/16/2017, 11/12/2018, 12/19/2022   Influenza-Unspecified 11/30/2015, 11/24/2019, 10/27/2020, 09/12/2021   Moderna SARS-COV2 Booster Vaccination 10/24/2021   PFIZER(Purple Top)SARS-COV-2 Vaccination 02/13/2019, 03/10/2019, 10/27/2020   Pneumococcal Polysaccharide-23 03/10/2015   Tdap 08/19/2012   Zoster Recombinant(Shingrix) 12/15/2020, 05/03/2021   Health Maintenance Due  Topic Date Due   Pneumococcal Vaccine 14-50 Years old (2 of 2  - PCV) 03/09/2016   DTaP/Tdap/Td (2 - Td or Tdap) 08/20/2022      Past Medical History:  Diagnosis Date   Abnormal EKG    NS ST-T EKG changes with negative Stress cardiolite 2003   Anxiety    Dehydration 01/30/2003   [place] , [place]   Diabetes mellitus without complication (HCC)    GERD (gastroesophageal reflux disease)    Hyperglycemia    Hyperlipidemia    IBS (irritable bowel syndrome)    Obesity    OSA (obstructive sleep apnea)    resolved post surgery   Past Surgical History:  Procedure Laterality Date   CHOLECYSTECTOMY N/A 06/07/2019   Procedure: LAPAROSCOPIC CHOLECYSTECTOMY;  Surgeon: Andria Meuse, MD;  Location: [facility] OR;  Service: General;  Laterality: N/A;   COLONOSCOPY     TONSILLECTOMY AND ADENOIDECTOMY      with above   UVULOPALATOPHARYNGOPLASTY  01/29/2002   Post op bleeding complication;  Dr Marinda Elk TOOTH EXTRACTION      reports that he has never smoked. He has never used smokeless tobacco. He reports that he does not drink alcohol and does not use drugs. family history includes Cirrhosis in his brother; Diabetes in his maternal grandmother and paternal grandmother; Heart attack (age of onset: 61) in his maternal grandmother and paternal grandmother; Hyperlipidemia in his father and mother; Hypertension in his father and mother; Other in his mother; Prostate cancer  in his paternal grandfather. Allergies  Allergen Reactions   Clomiphene Citrate     Other reaction(s): LFT elevation of  AST/ALT into 500's   Sulfa Antibiotics Other (See Comments)    Patient is not sure what reaction.    Current Outpatient Medications on File Prior to Visit  Medication Sig Dispense Refill   cetirizine (ZYRTEC) 10 MG tablet Take 10 mg by mouth daily.     Continuous Glucose Receiver (FREESTYLE LIBRE 3 READER) DEVI Use as directed to continuously check sugar E11.9 1 each 0   Continuous Glucose Sensor (FREESTYLE LIBRE 3 SENSOR) MISC Place 1 sensor on the skin every 14 days.  Use to check glucose continuously E11.9 6 each 3   glucose blood test strip Use as instructed twice a day E 11.9 Contour Next Strips 100 each 2   guanFACINE (INTUNIV) 2 MG TB24 ER tablet Take 2 mg by mouth daily.     hydrocortisone 2.5 % cream APPLY TOPICALLY TO THE AFFECTED AREA TWICE DAILY 30 g 0   ketoconazole (NIZORAL) 2 % shampoo Apply 1 Application topically 2 (two) times a week. 120 mL 6   lisdexamfetamine (VYVANSE) 70 MG capsule Take 70 mg by mouth every morning.     losartan (COZAAR) 25 MG tablet TAKE 1 TABLET BY MOUTH  DAILY 90 tablet 3   metFORMIN (GLUCOPHAGE-XR) 500 MG 24 hr tablet TAKE 4 TABLETS BY MOUTH ONCE DAILY WITH BREAKFAST 360 tablet 3   Probiotic Product (ALIGN PO) Take 1 capsule by mouth daily.      rosuvastatin (CRESTOR) 10 MG tablet Take 1 tablet (10 mg total) by mouth daily. 90 tablet 3   Semaglutide,0.25 or 0.5MG /DOS, (OZEMPIC, 0.25 OR 0.5 MG/DOSE,) 2 MG/3ML SOPN Take 0.25 mg subcutaneous once weekly 3 mL 11   solifenacin (VESICARE) 5 MG tablet Take 1 tablet (5 mg total) by mouth daily. 90 tablet 3   venlafaxine XR (EFFEXOR-XR) 75 MG 24 hr capsule Take 3 capsules (225 mg total) by mouth daily with breakfast. 270 capsule 3   No current facility-administered medications on file prior to visit.        ROS:  All others reviewed and negative.  Objective        PE:  BP 128/78 (BP Location: Right Arm, Patient Position: Sitting, Cuff Size: Normal)   Pulse 100   Temp 98.3 F (36.8 C) (Oral)   Ht 5\' 8"  (1.727 m)   Wt 250 lb (113.4 kg)   SpO2 99%   BMI 38.01 kg/m                 Constitutional: Pt appears in NAD               HENT: Head: NCAT.                Right Ear: External ear normal.                 Left Ear: External ear normal.                Eyes: . Pupils are equal, round, and reactive to light. Conjunctivae and EOM are normal               Nose: without d/c or deformity               Neck: Neck supple. Gross normal ROM               Cardiovascular:  Normal rate and regular rhythm.  Pulmonary/Chest: Effort normal and breath sounds without rales or wheezing.                Abd:  Soft, NT, ND, + BS, no organomegaly               Neurological: Pt is alert. At baseline orientation, motor grossly intact               Skin: Skin is warm. No rashes, no other new lesions, LE edema - none               Psychiatric: Pt behavior is normal without agitation   Micro: none  Cardiac tracings I have personally interpreted today:  none  Pertinent Radiological findings (summarize): none   Lab Results  Component Value Date   WBC 7.2 03/29/2023   HGB 14.4 03/29/2023   HCT 44.3 03/29/2023   PLT 260.0 03/29/2023   GLUCOSE 209 (H) 03/29/2023   CHOL 84 03/29/2023   TRIG 119.0 03/29/2023   HDL 30.30 (L) 03/29/2023   LDLDIRECT 53.0 08/29/2017   LDLCALC 30 03/29/2023   ALT 16 03/29/2023   AST 17 03/29/2023   NA 136 03/29/2023   K 3.8 03/29/2023   CL 99 03/29/2023   CREATININE 0.86 03/29/2023   BUN 13 03/29/2023   CO2 29 03/29/2023   TSH 0.91 03/29/2023   PSA 0.48 03/29/2023   HGBA1C 10.9 (H) 03/29/2023   MICROALBUR 1.7 03/29/2023   Assessment/Plan:  Djuan Gerhardt Gleed is a 53 y.o. Black or African American [2] male with  has a past medical history of Abnormal EKG, Anxiety, Dehydration (01/30/2003), Diabetes mellitus without complication (HCC), GERD (gastroesophageal reflux disease), Hyperglycemia, Hyperlipidemia, IBS (irritable bowel syndrome), Obesity, and OSA (obstructive sleep apnea).  Encounter for well adult exam with abnormal findings Age and sex appropriate education and counseling updated with regular exercise and diet Referrals for preventative services - none needed Immunizations addressed - for prevnar 20 today, for tdap at pharmacy Smoking counseling  - none needed Evidence for depression or other mood disorder - none significant Most recent labs reviewed. I have personally reviewed and have noted: 1) the  patient's medical and social history 2) The patient's current medications and supplements 3) The patient's height, weight, and BMI have been recorded in the chart   B12 deficiency Lab Results  Component Value Date   VITAMINB12 157 (L) 03/29/2023   Low, to start oral replacement - b12 1000 mcg qd   Diabetes (HCC) Lab Results  Component Value Date   HGBA1C 10.9 (H) 03/29/2023   Uncontrolled, pt to restart medical treatment metformin ER 4 every day, and start ozempic 0.25 mg wekly with intent to titrate   Hyperlipidemia Lab Results  Component Value Date   LDLCALC 30 03/29/2023   Stable, pt to continue current statin crestor 10 mg   Hypogonadism in male Also for f/u testosterone,  to f/u any worsening symptoms or concerns  Vitamin D deficiency Last vitamin D Lab Results  Component Value Date   VD25OH 15.18 (L) 03/29/2023   Low, to start oral replacement  Followup: Return in about 6 months (around 09/23/2023).  Oliver Barre, MD 03/30/2023 5:00 PM [facility]

## 2023-03-28 ENCOUNTER — Telehealth: Payer: No Typology Code available for payment source | Admitting: Physician Assistant

## 2023-03-28 DIAGNOSIS — K529 Noninfective gastroenteritis and colitis, unspecified: Secondary | ICD-10-CM

## 2023-03-28 MED ORDER — PANTOPRAZOLE SODIUM 40 MG PO TBEC: 40.0000 mg | DELAYED_RELEASE_TABLET | Freq: Every day | ORAL | 0 refills | Status: DC

## 2023-03-28 MED ORDER — ONDANSETRON 4 MG PO TBDP: 4.0000 mg | ORAL_TABLET | Freq: Three times a day (TID) | ORAL | 0 refills | Status: DC | PRN

## 2023-03-28 NOTE — Patient Instructions (Signed)
 Gregory Owens, thank you for joining [name], PA-C for today's virtual visit.  While this provider is not your primary care provider (PCP), if your PCP is located in our provider database this encounter information will be shared with them immediately following your visit.   A [facility] MyChart account gives you access to today's visit and all your visits, tests, and labs performed at [facility] " click here if you don't have a [facility] MyChart account or go to [email or web address]  Consent: (Patient) Gregory Owens provided verbal consent for this virtual visit at the beginning of the encounter.  Current Medications:  Current Outpatient Medications:    cetirizine (ZYRTEC) 10 MG tablet, Take 10 mg by mouth daily., Disp: , Rfl:    Continuous Glucose Receiver (FREESTYLE LIBRE 3 READER) DEVI, Use as directed to continuously check sugar E11.9, Disp: 1 each, Rfl: 0   Continuous Glucose Sensor (FREESTYLE LIBRE 3 SENSOR) MISC, Place 1 sensor on the skin every 14 days. Use to check glucose continuously E11.9, Disp: 6 each, Rfl: 3   glucose blood test strip, Use as instructed twice a day E 11.9 Contour Next Strips, Disp: 100 each, Rfl: 2   guanFACINE (INTUNIV) 2 MG TB24 ER tablet, Take 2 mg by mouth daily., Disp: , Rfl:    hydrocortisone 2.5 % cream, APPLY TOPICALLY TO THE AFFECTED AREA TWICE DAILY, Disp: 30 g, Rfl: 0   ketoconazole (NIZORAL) 2 % shampoo, Apply 1 Application topically 2 (two) times a week., Disp: 120 mL, Rfl: 6   lisdexamfetamine (VYVANSE) 70 MG capsule, Take 70 mg by mouth every morning., Disp: , Rfl:    losartan (COZAAR) 25 MG tablet, TAKE 1 TABLET BY MOUTH  DAILY, Disp: 90 tablet, Rfl: 3   metFORMIN (GLUCOPHAGE-XR) 500 MG 24 hr tablet, TAKE 4 TABLETS BY MOUTH ONCE DAILY WITH BREAKFAST, Disp: 360 tablet, Rfl: 3   Probiotic Product (ALIGN PO), Take 1 capsule by mouth daily. , Disp: , Rfl:    rosuvastatin (CRESTOR) 10 MG tablet,  Take 1 tablet (10 mg total) by mouth daily., Disp: 90 tablet, Rfl: 3   Semaglutide,0.25 or 0.5MG /DOS, (OZEMPIC, 0.25 OR 0.5 MG/DOSE,) 2 MG/3ML SOPN, Take 0.25 mg subcutaneous once weekly, Disp: 3 mL, Rfl: 11   solifenacin (VESICARE) 5 MG tablet, Take 1 tablet (5 mg total) by mouth daily., Disp: 90 tablet, Rfl: 3   venlafaxine XR (EFFEXOR-XR) 75 MG 24 hr capsule, Take 3 capsules (225 mg total) by mouth daily with breakfast., Disp: 270 capsule, Rfl: 3   Medications ordered in this encounter:  No orders of the defined types were placed in this encounter.    *If you need refills on other medications prior to your next appointment, please contact your pharmacy*  Follow-Up: Call back or seek an in-person evaluation if the symptoms worsen or if the condition fails to improve as anticipated.  [facility] [phone]  Other Instructions Please keep well-hydrated and try to rest. Follow the dietary recommendations below. Start the Protonix and the Zofran as directed. Can use Imodium OTC. Hold next dose of the Ozempic until these symptoms resolve. Any worsening of symptoms, you need an in-person evaluation ASAP.  GERD in Adults: Diet Changes When you have gastroesophageal reflux disease (GERD), you may need to make changes to your diet. Choosing the right foods can help with your symptoms. Think about working with an expert in healthy eating called a dietitian. They can help you make healthy food choices.  What are tips for following this plan? Reading food labels Look for foods that are low in saturated fat. Foods that may help with your symptoms include: Foods with less than 5% of daily value (DV) of fat. Foods with 0 grams of trans fat. Cooking [employer] in ways that don't use a lot of fat. These ways include: Baking. Steaming. Grilling. Broiling. To add flavor, try to use herbs that are low in spice and acidity. Avoid frying your food. Meal planning  Eat small  meals often rather than eating 3 large meals each day. Eat your meals slowly in a place where you feel relaxed. If told by your health care provider, avoid: Foods that cause symptoms. Keep a food diary to keep track of foods that cause symptoms. Alcohol. Drinking a lot of liquid with meals. General instructions For 2-3 hours after you eat, avoid: Bending over. Exercise. Lying down. Chew sugar-free gum after meals. What foods should I eat? Eat a healthy diet. Try to include: Foods with high amounts of fiber. These include: Fruits and vegetables. Whole grains and beans. Low-fat dairy products. Lean meats, fish, and poultry. Egg whites. Foods that cause symptoms in someone else may not cause symptoms for you. Work with your provider to find foods that are safe for you. The items listed above may not be all the foods and drinks you can have. Talk with a dietitian to learn more. The items listed above may not be a complete list of foods and beverages you can eat and drink. Contact a dietitian for more information. What foods should I avoid? Limiting some of these foods may help with your symptoms. Each person is different. Talk with a dietitian or your provider to help you find the exact foods to avoid. Some of the foods to avoid may include: Fruits Fruits with a lot of acid in them. These may include citrus fruits, such as oranges, grapefruit, pineapple, and lemons. Vegetables Deep-fried vegetables, such as [place] fries. Vegetables, sauces, or toppings made with added fat and vegetables with acid in them. These may include tomatoes and tomato products, chili peppers, onions, garlic, and horseradish. Grains Pastries or quick breads with added fat. Meats and other proteins High-fat meats, such as fatty beef or pork, hot dogs, ribs, ham, sausage, salami, and bacon. Fried meat or protein, such as fried fish and fried chicken. Egg yolks. Fats and oils Butter. Margarine. Shortening.  Ghee. Drinks Coffee and other drinks with caffeine in them. Fizzy and sugary drinks, such as soda and energy drinks. Fruit juice made with acidic fruits, such as orange or grapefruit. Tomato juice. Sweets and desserts Chocolate and cocoa. Donuts. Seasonings and condiments Mint, such as peppermint and spearmint. Condiments, herbs, or seasonings that cause symptoms. These may include curry, hot sauce, or vinegar-based salad dressings. The items listed above may not be all the foods and drinks you should avoid. Talk with a dietitian to learn more. Questions to ask your health care provider Changes to your diet and everyday life are often the first steps taken to manage symptoms of GERD. If these changes don't help, talk with your provider about taking medicines. Where to find more information International [employer] for Gastrointestinal Disorders: aboutgerd.org This information is not intended to replace advice given to you by your health care provider. Make sure you discuss any questions you have with your health care provider. Document Revised: 11/27/2022 Document Reviewed: 06/13/2022 Elsevier Patient Education  2024 [employer].   If you have been  instructed to have an in-person evaluation today at a local Urgent Care facility, please use the link below. It will take you to a list of all of our available [facility], including address, phone number and hours of operation. Please do not delay care.  [facility]  If you or a family member do not have a primary care provider, use the link below to schedule a visit and establish care. When you choose a [facility] primary care physician or advanced practice provider, you gain a long-term partner in health. Find a Primary Care Provider  Learn more about [facility] in-office and virtual care options: [facility] - Get Care Now

## 2023-03-28 NOTE — Progress Notes (Signed)
 Virtual Visit Consent   Gregory Owens, you are scheduled for a virtual visit with a [facility] provider today. Just as with appointments in the office, your consent must be obtained to participate. Your consent will be active for this visit and any virtual visit you may have with one of our providers in the next 365 days. If you have a MyChart account, a copy of this consent can be sent to you electronically.  As this is a virtual visit, video technology does not allow for your provider to perform a traditional examination. This may limit your provider's ability to fully assess your condition. If your provider identifies any concerns that need to be evaluated in person or the need to arrange testing (such as labs, EKG, etc.), we will make arrangements to do so. Although advances in technology are sophisticated, we cannot ensure that it will always work on either your end or our end. If the connection with a video visit is poor, the visit may have to be switched to a telephone visit. With either a video or telephone visit, we are not always able to ensure that we have a secure connection.  By engaging in this virtual visit, you consent to the provision of healthcare and authorize for your insurance to be billed (if applicable) for the services provided during this visit. Depending on your insurance coverage, you may receive a charge related to this service.  I need to obtain your verbal consent now. Are you willing to proceed with your visit today? Erik Handy Mcloud has provided verbal consent on 03/28/2023 for a virtual visit (video or telephone). Gregory Owens, [place]  Date: 03/28/2023 1:00 PM   Virtual Visit via Video Note   I, Gregory Owens, connected with  Carey Johndrow Lennette Bihari  ([id number], 1970-04-03) on 03/28/23 at 12:15 PM EST by a video-enabled telemedicine application and verified that I am speaking with the correct person using two  identifiers.  Location: Patient: Virtual Visit Location Patient: Home Provider: Virtual Visit Location Provider: Home Office   I discussed the limitations of evaluation and management by telemedicine and the availability of in person appointments. The patient expressed understanding and agreed to proceed.    History of Present Illness: Gregory Owens is a 53 y.o. who identifies as a male who was assigned male at birth, and is being seen today for nausea/vomiting starting overnight. This is associated with some increased reflux and heartburn and some loose stoo. Denies hematemesis, hematochezia, melena. Notes wife sick with stomach virus last week. Of note, he also started first dose of Ozempic on Monday. Is noting some chest tightness with the reflux sensation.   HPI: HPI  Problems:  Patient Active Problem List   Diagnosis Date Noted   B12 deficiency 06/30/2022   Other fatigue 06/30/2022   History of colonic polyps 06/30/2022   Non-recurrent acute serous otitis media of left ear 01/25/2022   Right knee pain 01/06/2022   Abdominal pain, generalized 08/02/2021   Abnormal LFTs 02/17/2021   Abnormal serum lipase level 02/17/2021   Dark urine 12/16/2020   Body mass index (BMI) 38.0-38.9, adult 03/29/2020   Hyperglycemia due to type 2 diabetes mellitus (HCC) 03/29/2020   Metabolic syndrome 03/29/2020   Nonalcoholic steatohepatitis (NASH) 03/29/2020   Vitamin D deficiency 03/29/2020   Low back pain 03/29/2020   Right flank pain, chronic 02/08/2020   Right lower quadrant abdominal pain 12/20/2019   Hematochezia 07/09/2019   Cholecystitis 06/07/2019   S/P laparoscopic cholecystectomy  06/07/2019   Headache 02/08/2019   Urinary urgency 11/12/2018   Hypogonadism in male 09/04/2017   Bilateral foot pain 03/08/2017   Dysphagia 08/29/2016   ADHD 03/28/2016   Morbid obesity (HCC) 12/06/2015   Achrochordon 10/11/2015   Seborrheic dermatitis 10/11/2015   Encounter for well adult  exam with abnormal findings 03/10/2015   Skin lesion 03/10/2015   GERD (gastroesophageal reflux disease) 11/04/2013   Tachycardia 11/04/2013   Nonspecific abnormal electrocardiogram (ECG) (EKG) 11/04/2013   Non-compliant behavior 10/14/2013   Diabetes (HCC) 08/19/2012   PLMD (periodic limb movement disorder) 05/30/2012   SLEEP DISORDER 12/05/2009   Hyperlipidemia 08/25/2009   GAD (generalized anxiety disorder) 08/25/2009   Obstructive sleep apnea 08/25/2009   RHINITIS 05/24/2009    Allergies:  Allergies  Allergen Reactions   Clomiphene Citrate     Other reaction(s): LFT elevation of  AST/ALT into 500's   Sulfa Antibiotics Other (See Comments)    Patient is not sure what reaction.    Medications:  Current Outpatient Medications:    ondansetron (ZOFRAN-ODT) 4 MG disintegrating tablet, Take 1 tablet (4 mg total) by mouth every 8 (eight) hours as needed for nausea or vomiting., Disp: 20 tablet, Rfl: 0   pantoprazole (PROTONIX) 40 MG tablet, Take 1 tablet (40 mg total) by mouth daily., Disp: 30 tablet, Rfl: 0   cetirizine (ZYRTEC) 10 MG tablet, Take 10 mg by mouth daily., Disp: , Rfl:    Continuous Glucose Receiver (FREESTYLE LIBRE 3 READER) DEVI, Use as directed to continuously check sugar E11.9, Disp: 1 each, Rfl: 0   Continuous Glucose Sensor (FREESTYLE LIBRE 3 SENSOR) MISC, Place 1 sensor on the skin every 14 days. Use to check glucose continuously E11.9, Disp: 6 each, Rfl: 3   glucose blood test strip, Use as instructed twice a day E 11.9 Contour Next Strips, Disp: 100 each, Rfl: 2   guanFACINE (INTUNIV) 2 MG TB24 ER tablet, Take 2 mg by mouth daily., Disp: , Rfl:    hydrocortisone 2.5 % cream, APPLY TOPICALLY TO THE AFFECTED AREA TWICE DAILY, Disp: 30 g, Rfl: 0   ketoconazole (NIZORAL) 2 % shampoo, Apply 1 Application topically 2 (two) times a week., Disp: 120 mL, Rfl: 6   lisdexamfetamine (VYVANSE) 70 MG capsule, Take 70 mg by mouth every morning., Disp: , Rfl:    losartan  (COZAAR) 25 MG tablet, TAKE 1 TABLET BY MOUTH  DAILY, Disp: 90 tablet, Rfl: 3   metFORMIN (GLUCOPHAGE-XR) 500 MG 24 hr tablet, TAKE 4 TABLETS BY MOUTH ONCE DAILY WITH BREAKFAST, Disp: 360 tablet, Rfl: 3   Probiotic Product (ALIGN PO), Take 1 capsule by mouth daily. , Disp: , Rfl:    rosuvastatin (CRESTOR) 10 MG tablet, Take 1 tablet (10 mg total) by mouth daily., Disp: 90 tablet, Rfl: 3   Semaglutide,0.25 or 0.5MG /DOS, (OZEMPIC, 0.25 OR 0.5 MG/DOSE,) 2 MG/3ML SOPN, Take 0.25 mg subcutaneous once weekly, Disp: 3 mL, Rfl: 11   solifenacin (VESICARE) 5 MG tablet, Take 1 tablet (5 mg total) by mouth daily., Disp: 90 tablet, Rfl: 3   venlafaxine XR (EFFEXOR-XR) 75 MG 24 hr capsule, Take 3 capsules (225 mg total) by mouth daily with breakfast., Disp: 270 capsule, Rfl: 3  Observations/Objective: Patient is well-developed, well-nourished in no acute distress.  Resting comfortably  at home.  Head is normocephalic, atraumatic.  No labored breathing.  Speech is clear and coherent with logical content.  Patient is alert and oriented at baseline.   Assessment and Plan: 1. Gastroenteritis (Primary) -  ondansetron (ZOFRAN-ODT) 4 MG disintegrating tablet; Take 1 tablet (4 mg total) by mouth every 8 (eight) hours as needed for nausea or vomiting.  Dispense: 20 tablet; Refill: 0 - pantoprazole (PROTONIX) 40 MG tablet; Take 1 tablet (40 mg total) by mouth daily.  Dispense: 30 tablet; Refill: 0  And Gastritis. Suspect viral etiology giving known exposure and low-grade fever, but Ozempic can certainly be contributing. Supportive measures and OTC medications reviewed with patient. Start ondansetron and Protonix per orders. Hold next dose of Ozempic until these GI symptoms resolve. If any recurrence with next dosing, needs to discuss with PCP as alternatives may be needed.   Follow Up Instructions: I discussed the assessment and treatment plan with the patient. The patient was provided an opportunity to ask  questions and all were answered. The patient agreed with the plan and demonstrated an understanding of the instructions.  A copy of instructions were sent to the patient via MyChart unless otherwise noted below.   The patient was advised to call back or seek an in-person evaluation if the symptoms worsen or if the condition fails to improve as anticipated.    Gregory Climes, PA-C

## 2023-03-29 ENCOUNTER — Other Ambulatory Visit: Payer: No Typology Code available for payment source

## 2023-03-29 DIAGNOSIS — E559 Vitamin D deficiency, unspecified: Secondary | ICD-10-CM

## 2023-03-29 DIAGNOSIS — E291 Testicular hypofunction: Secondary | ICD-10-CM

## 2023-03-29 DIAGNOSIS — E1165 Type 2 diabetes mellitus with hyperglycemia: Secondary | ICD-10-CM | POA: Diagnosis not present

## 2023-03-29 DIAGNOSIS — E538 Deficiency of other specified B group vitamins: Secondary | ICD-10-CM | POA: Diagnosis not present

## 2023-03-29 DIAGNOSIS — Z125 Encounter for screening for malignant neoplasm of prostate: Secondary | ICD-10-CM

## 2023-03-29 LAB — URINALYSIS, ROUTINE W REFLEX MICROSCOPIC
Bilirubin Urine: NEGATIVE
Hgb urine dipstick: NEGATIVE
Ketones, ur: 15 — AB
Leukocytes,Ua: NEGATIVE
Nitrite: NEGATIVE
RBC / HPF: NONE SEEN (ref 0–?)
Specific Gravity, Urine: >=1.03 — AB (ref 1.000–1.030)
Total Protein, Urine: 30 — AB
Urine Glucose: NEGATIVE
Urobilinogen, UA: 4 — AB (ref 0.0–1.0)
WBC, UA: NONE SEEN (ref 0–?)
pH: 6 (ref 5.0–8.0)

## 2023-03-29 LAB — LIPID PANEL
Cholesterol: 84 mg/dL (ref 0–200)
HDL: 30.3 mg/dL — ABNORMAL LOW (ref >39.00)
LDL Cholesterol: 30 mg/dL (ref 0–99)
NonHDL: 54.01
Total CHOL/HDL Ratio: 3
Triglycerides: 119 mg/dL (ref 0.0–149.0)
VLDL: 23.8 mg/dL (ref 0.0–40.0)

## 2023-03-29 LAB — HEPATIC FUNCTION PANEL
ALT: 16 U/L (ref 0–53)
AST: 17 U/L (ref 0–37)
Albumin: 3.7 g/dL (ref 3.5–5.2)
Alkaline Phosphatase: 55 U/L (ref 39–117)
Bilirubin, Direct: 0.2 mg/dL (ref 0.0–0.3)
Total Bilirubin: 0.6 mg/dL (ref 0.2–1.2)
Total Protein: 6.8 g/dL (ref 6.0–8.3)

## 2023-03-29 LAB — CBC WITH DIFFERENTIAL/PLATELET
Basophils Absolute: 0 10*3/uL (ref 0.0–0.1)
Basophils Relative: 0.2 % (ref 0.0–3.0)
Eosinophils Absolute: 0 10*3/uL (ref 0.0–0.7)
Eosinophils Relative: 0.2 % (ref 0.0–5.0)
HCT: 44.3 % (ref 39.0–52.0)
Hemoglobin: 14.4 g/dL (ref 13.0–17.0)
Lymphocytes Relative: 15 % (ref 12.0–46.0)
Lymphs Abs: 1.1 10*3/uL (ref 0.7–4.0)
MCHC: 32.5 g/dL (ref 30.0–36.0)
MCV: 95.2 fL (ref 78.0–100.0)
Monocytes Absolute: 0.7 10*3/uL (ref 0.1–1.0)
Monocytes Relative: 9.2 % (ref 3.0–12.0)
Neutro Abs: 5.4 10*3/uL (ref 1.4–7.7)
Neutrophils Relative %: 75.4 % (ref 43.0–77.0)
Platelets: 260 10*3/uL (ref 150.0–400.0)
RBC: 4.65 Mil/uL (ref 4.22–5.81)
RDW: 12.6 % (ref 11.5–15.5)
WBC: 7.2 10*3/uL (ref 4.0–10.5)

## 2023-03-29 LAB — BASIC METABOLIC PANEL
BUN: 13 mg/dL (ref 6–23)
CO2: 29 meq/L (ref 19–32)
Calcium: 8.3 mg/dL — ABNORMAL LOW (ref 8.4–10.5)
Chloride: 99 meq/L (ref 96–112)
Creatinine, Ser: 0.86 mg/dL (ref 0.40–1.50)
GFR: 98.67 mL/min (ref >60.00)
Glucose, Bld: 209 mg/dL — ABNORMAL HIGH (ref 70–99)
Potassium: 3.8 meq/L (ref 3.5–5.1)
Sodium: 136 meq/L (ref 135–145)

## 2023-03-29 LAB — MICROALBUMIN / CREATININE URINE RATIO
Creatinine,U: 241.9 mg/dL
Microalb Creat Ratio: 6.9 mg/g (ref 0.0–30.0)
Microalb, Ur: 1.7 mg/dL (ref 0.0–1.9)

## 2023-03-29 LAB — TSH: TSH: 0.91 u[IU]/mL (ref 0.35–5.50)

## 2023-03-29 LAB — VITAMIN D 25 HYDROXY (VIT D DEFICIENCY, FRACTURES): VITD: 15.18 ng/mL — ABNORMAL LOW (ref 30.00–100.00)

## 2023-03-29 LAB — TESTOSTERONE: Testosterone: 165.09 ng/dL — ABNORMAL LOW (ref 300.00–890.00)

## 2023-03-29 LAB — PSA: PSA: 0.48 ng/mL (ref 0.10–4.00)

## 2023-03-29 LAB — HEMOGLOBIN A1C: Hgb A1c MFr Bld: 10.9 % — ABNORMAL HIGH (ref 4.6–6.5)

## 2023-03-29 LAB — VITAMIN B12: Vitamin B-12: 157 pg/mL — ABNORMAL LOW (ref 211–911)

## 2023-03-30 ENCOUNTER — Encounter: Payer: Self-pay | Admitting: Internal Medicine

## 2023-03-30 NOTE — Assessment & Plan Note (Signed)
 Age and sex appropriate education and counseling updated with regular exercise and diet Referrals for preventative services - none needed Immunizations addressed - for prevnar 20 today, for tdap at pharmacy Smoking counseling  - none needed Evidence for depression or other mood disorder - none significant Most recent labs reviewed. I have personally reviewed and have noted: 1) the patient's medical and social history 2) The patient's current medications and supplements 3) The patient's height, weight, and BMI have been recorded in the chart

## 2023-03-30 NOTE — Assessment & Plan Note (Signed)
 Lab Results  Component Value Date   VITAMINB12 157 (L) 03/29/2023   Low, to start oral replacement - b12 1000 mcg qd

## 2023-03-30 NOTE — Assessment & Plan Note (Signed)
 Lab Results  Component Value Date   LDLCALC 30 03/29/2023   Stable, pt to continue current statin crestor 10 mg

## 2023-03-30 NOTE — Assessment & Plan Note (Signed)
 Also for f/u testosterone,  to f/u any worsening symptoms or concerns

## 2023-03-30 NOTE — Assessment & Plan Note (Signed)
 Last vitamin D Lab Results  Component Value Date   VD25OH 15.18 (L) 03/29/2023   Low, to start oral replacement

## 2023-03-30 NOTE — Assessment & Plan Note (Addendum)
 Lab Results  Component Value Date   HGBA1C 10.9 (H) 03/29/2023   Uncontrolled, pt to restart medical treatment metformin ER 4 every day, and start ozempic 0.25 mg wekly with intent to titrate

## 2023-04-01 ENCOUNTER — Other Ambulatory Visit: Payer: Self-pay | Admitting: Internal Medicine

## 2023-04-01 ENCOUNTER — Encounter: Payer: Self-pay | Admitting: Internal Medicine

## 2023-04-01 MED ORDER — PIOGLITAZONE HCL 45 MG PO TABS
45.0000 mg | ORAL_TABLET | Freq: Every day | ORAL | 3 refills | Status: DC
Start: 2023-04-01 — End: 2023-07-08

## 2023-04-08 ENCOUNTER — Encounter: Payer: Self-pay | Admitting: Internal Medicine

## 2023-04-30 ENCOUNTER — Other Ambulatory Visit: Payer: Self-pay

## 2023-04-30 ENCOUNTER — Other Ambulatory Visit: Payer: Self-pay | Admitting: Internal Medicine

## 2023-05-05 ENCOUNTER — Other Ambulatory Visit: Payer: Self-pay | Admitting: Internal Medicine

## 2023-05-06 ENCOUNTER — Other Ambulatory Visit: Payer: Self-pay

## 2023-05-06 ENCOUNTER — Other Ambulatory Visit: Payer: Self-pay | Admitting: Internal Medicine

## 2023-05-06 ENCOUNTER — Encounter: Payer: Self-pay | Admitting: Internal Medicine

## 2023-05-22 ENCOUNTER — Other Ambulatory Visit: Payer: Self-pay | Admitting: Internal Medicine

## 2023-05-22 ENCOUNTER — Encounter: Payer: Self-pay | Admitting: Internal Medicine

## 2023-06-26 ENCOUNTER — Encounter: Payer: Self-pay | Admitting: Internal Medicine

## 2023-06-27 ENCOUNTER — Ambulatory Visit: Payer: Self-pay | Admitting: Internal Medicine

## 2023-06-27 ENCOUNTER — Ambulatory Visit: Admitting: Family Medicine

## 2023-06-27 ENCOUNTER — Other Ambulatory Visit: Payer: Self-pay | Admitting: Internal Medicine

## 2023-06-27 ENCOUNTER — Ambulatory Visit: Payer: Self-pay

## 2023-06-27 ENCOUNTER — Other Ambulatory Visit (INDEPENDENT_AMBULATORY_CARE_PROVIDER_SITE_OTHER)

## 2023-06-27 VITALS — BP 128/84 | HR 96 | Ht 68.0 in | Wt 251.0 lb

## 2023-06-27 DIAGNOSIS — M25561 Pain in right knee: Secondary | ICD-10-CM | POA: Diagnosis not present

## 2023-06-27 DIAGNOSIS — E538 Deficiency of other specified B group vitamins: Secondary | ICD-10-CM | POA: Diagnosis not present

## 2023-06-27 DIAGNOSIS — Z7984 Long term (current) use of oral hypoglycemic drugs: Secondary | ICD-10-CM

## 2023-06-27 DIAGNOSIS — E1165 Type 2 diabetes mellitus with hyperglycemia: Secondary | ICD-10-CM

## 2023-06-27 DIAGNOSIS — E559 Vitamin D deficiency, unspecified: Secondary | ICD-10-CM

## 2023-06-27 DIAGNOSIS — Z7985 Long-term (current) use of injectable non-insulin antidiabetic drugs: Secondary | ICD-10-CM

## 2023-06-27 DIAGNOSIS — G8929 Other chronic pain: Secondary | ICD-10-CM

## 2023-06-27 DIAGNOSIS — M1711 Unilateral primary osteoarthritis, right knee: Secondary | ICD-10-CM | POA: Diagnosis not present

## 2023-06-27 LAB — HEPATIC FUNCTION PANEL
ALT: 13 U/L (ref 0–53)
AST: 13 U/L (ref 0–37)
Albumin: 4 g/dL (ref 3.5–5.2)
Alkaline Phosphatase: 72 U/L (ref 39–117)
Bilirubin, Direct: 0 mg/dL (ref 0.0–0.3)
Total Bilirubin: 0.3 mg/dL (ref 0.2–1.2)
Total Protein: 7.4 g/dL (ref 6.0–8.3)

## 2023-06-27 LAB — BASIC METABOLIC PANEL WITH GFR
BUN: 12 mg/dL (ref 6–23)
CO2: 29 meq/L (ref 19–32)
Calcium: 9.1 mg/dL (ref 8.4–10.5)
Chloride: 102 meq/L (ref 96–112)
Creatinine, Ser: 0.89 mg/dL (ref 0.40–1.50)
GFR: 97.48 mL/min (ref >60.00)
Glucose, Bld: 205 mg/dL — ABNORMAL HIGH (ref 70–99)
Potassium: 4 meq/L (ref 3.5–5.1)
Sodium: 138 meq/L (ref 135–145)

## 2023-06-27 LAB — HEMOGLOBIN A1C: Hgb A1c MFr Bld: 8.1 % — ABNORMAL HIGH (ref 4.6–6.5)

## 2023-06-27 LAB — LIPID PANEL
Cholesterol: 119 mg/dL (ref 0–200)
HDL: 35.1 mg/dL — ABNORMAL LOW (ref >39.00)
LDL Cholesterol: 58 mg/dL (ref 0–99)
NonHDL: 83.73
Total CHOL/HDL Ratio: 3
Triglycerides: 130 mg/dL (ref 0.0–149.0)
VLDL: 26 mg/dL (ref 0.0–40.0)

## 2023-06-27 LAB — VITAMIN D 25 HYDROXY (VIT D DEFICIENCY, FRACTURES): VITD: 17.99 ng/mL — ABNORMAL LOW (ref 30.00–100.00)

## 2023-06-27 LAB — VITAMIN B12: Vitamin B-12: 192 pg/mL — ABNORMAL LOW (ref 211–911)

## 2023-06-27 MED ORDER — SEMAGLUTIDE(0.25 OR 0.5MG/DOS) 2 MG/3ML ~~LOC~~ SOPN: PEN_INJECTOR | SUBCUTANEOUS | 3 refills | Status: DC

## 2023-06-27 NOTE — Progress Notes (Signed)
 Joanna Muck, PhD, LAT, ATC acting as a scribe for Gregory Juniper, MD.  Gregory Owens is a 53 y.o. male who presents to [facility] today for exacerbation of his R knee pain. Pt was last seen by Dr. Alease Hunter on 02/25/23 and was given a R knee steroid injection.  Of note, 2 days after the injection he was seen at UC/ED for hyperglycemia.  Today, pt reports he has been working w/ his PCP to better manage his diabetes, now on Ozempic  and Metformin . R knee pain returned a couple of weeks ago.   He does have a continuous glucometer in his typical blood sugars over the last month have been as high as 212 and as low as 160s.  He takes Ozempic  and metformin  but not Actos .  Dx imaging: 01/03/22 R knee XR   Pertinent review of systems: No fevers or chills  Relevant historical information: Sleep apnea.  Diabetes.   Exam:  BP 128/84   Pulse 96   Ht 5\' 8"  (1.727 m)   Wt 251 lb (113.9 kg)   SpO2 96%   BMI 38.16 kg/m  General: Well Developed, well nourished, and in no acute distress.   MSK: Right knee mild effusion normal-appearing. Normal motion.    Lab and Radiology Results  Procedure: Real-time Ultrasound Guided Injection of right knee joint superior lateral patella space Device: Philips Affiniti 50G/GE Logiq Images permanently stored and available for review in PACS Verbal informed consent obtained.  Discussed risks and benefits of procedure. Warned about infection, bleeding, hyperglycemia damage to structures among others. Patient expresses understanding and agreement Time-out conducted.   Noted no overlying erythema, induration, or other signs of local infection.   Skin prepped in a sterile fashion.   Local anesthesia: Topical Ethyl chloride.   With sterile technique and under real time ultrasound guidance: 40 mg of Kenalog and 2 mL of Marcaine  injected into knee joint. Fluid seen entering the joint capsule.   Completed without difficulty    Pain immediately resolved suggesting accurate placement of the medication.   Advised to call if fevers/chills, erythema, induration, drainage, or persistent bleeding.   Images permanently stored and available for review in the ultrasound unit.  Impression: Technically successful ultrasound guided injection.        Assessment and Plan: 53 y.o. male with right knee pain due to DJD.  Plan for steroid injection.  His blood sugar has previously been very uncontrolled.  He is getting back under control with metformin  and Ozempic .  He was prescribed pioglitazone  but has not started it yet.  Looking at his blood sugars I encouraged him to take the medication as prescribed as he is going to have A1c above range.  He is likely to have hyperglycemia with today's injection but that should not be in the 500s it should be more in the 300s and manageable for a few days.  Future steroid injections in the knee.  Since he is having hyperglycemia I would like to switch to Zilretta .  He will contact me in the future when his knee pain returns and we can get Zilretta  authorized.  This will cause less hyperglycemia.   PDMP not reviewed this encounter. Orders Placed This Encounter  Procedures   US  LIMITED JOINT SPACE STRUCTURES LOW RIGHT(NO LINKED CHARGES)    Reason for Exam (SYMPTOM  OR DIAGNOSIS REQUIRED):   right knee pain    Preferred imaging location?:   [facility]  No orders of the defined types were placed in this encounter.    Discussed warning signs or symptoms. Please see discharge instructions. Patient expresses understanding.   The above documentation has been reviewed and is accurate and complete Gregory Owens, M.D.

## 2023-06-27 NOTE — Patient Instructions (Signed)
 Thank you for coming in today.   You received an injection today. Seek immediate medical attention if the joint becomes red, extremely painful, or is oozing fluid.   Restart actone  Get labs  Let me know when your knee pain comes back and we will get Zilretta  authorized by your insurance company

## 2023-07-08 ENCOUNTER — Other Ambulatory Visit: Payer: Self-pay

## 2023-07-08 ENCOUNTER — Encounter: Payer: Self-pay | Admitting: Family Medicine

## 2023-07-08 ENCOUNTER — Ambulatory Visit (INDEPENDENT_AMBULATORY_CARE_PROVIDER_SITE_OTHER): Admitting: Family Medicine

## 2023-07-08 ENCOUNTER — Encounter: Payer: Self-pay | Admitting: Internal Medicine

## 2023-07-08 ENCOUNTER — Ambulatory Visit: Payer: Self-pay

## 2023-07-08 VITALS — BP 128/82 | HR 115 | Temp 98.1°F | Resp 18 | Ht 68.0 in | Wt 250.0 lb

## 2023-07-08 DIAGNOSIS — R519 Headache, unspecified: Secondary | ICD-10-CM | POA: Diagnosis not present

## 2023-07-08 DIAGNOSIS — K529 Noninfective gastroenteritis and colitis, unspecified: Secondary | ICD-10-CM

## 2023-07-08 LAB — POCT GLUCOSE (DEVICE FOR HOME USE): POC Glucose: 155 mg/dL — AB (ref 70–99)

## 2023-07-08 MED ORDER — SOLIFENACIN SUCCINATE 5 MG PO TABS: 5.0000 mg | ORAL_TABLET | Freq: Every day | ORAL | 3 refills | Status: AC

## 2023-07-08 MED ORDER — HYDROCORTISONE 2.5 % EX CREA: TOPICAL_CREAM | Freq: Two times a day (BID) | CUTANEOUS | 0 refills | Status: AC

## 2023-07-08 MED ORDER — KETOROLAC TROMETHAMINE 60 MG/2ML IM SOLN
60.0000 mg | Freq: Once | INTRAMUSCULAR | Status: AC
  Administered 2023-07-08: 60 mg via INTRAMUSCULAR

## 2023-07-08 MED ORDER — METFORMIN HCL ER 500 MG PO TB24: ORAL_TABLET | ORAL | 3 refills | Status: DC

## 2023-07-08 MED ORDER — SEMAGLUTIDE(0.25 OR 0.5MG/DOS) 2 MG/3ML ~~LOC~~ SOPN: PEN_INJECTOR | SUBCUTANEOUS | 3 refills | Status: DC

## 2023-07-08 MED ORDER — ROSUVASTATIN CALCIUM 10 MG PO TABS: 10.0000 mg | ORAL_TABLET | Freq: Every day | ORAL | 3 refills | Status: DC

## 2023-07-08 MED ORDER — FREESTYLE LIBRE 3 SENSOR MISC: 3 refills | Status: AC

## 2023-07-08 MED ORDER — KETOCONAZOLE 2 % EX SHAM: MEDICATED_SHAMPOO | CUTANEOUS | 6 refills | Status: AC

## 2023-07-08 MED ORDER — ONDANSETRON 4 MG PO TBDP
4.0000 mg | ORAL_TABLET | Freq: Three times a day (TID) | ORAL | 0 refills | Status: DC | PRN
Start: 2023-07-08 — End: 2023-11-18

## 2023-07-08 NOTE — Progress Notes (Signed)
 Assessment & Plan:  1. Acute intractable headache, unspecified headache type (Primary) Education provided on headaches. Patient will pick up his freestyle libre sensors and resume wearing.  - ketorolac  (TORADOL ) injection 60 mg given in office - POCT Glucose = 155   Follow up plan: Return if symptoms worsen or fail to improve.  [name], MSN, APRN, FNP-C  Subjective:  HPI: Gregory Owens is a 53 y.o. male presenting on 07/08/2023 for Headache (Started on SATURDAY - pain is 7 /10. Right side head hurts to touch. IBU does not help )  Patient is accompanied by his wife, who he is okay with being present.  Patient reports a headache that started two days ago. Area of pain is the right side. The pain comes and goes, but is constant at times. He rates the pain 7/10 and describes the pain as throbbing. Reports a similar headache in the past after cleaning ears with a cotton swab. Denies fever, stiff neck, eye pain, sore throat, and cold symptoms. Has taken Advil  400 mg, then 600 mg which was not helpful.  Diabetes uncontrolled with most recent A1c of 8.1 last month. He did increase Ozempic  dosage from 0.25 mg to 0.5 mg at that time. He has not checked his glucose or blood pressure since his headache started. He has not missed any doses of medications.    ROS: Negative unless specifically indicated above in HPI.   Relevant past medical history reviewed and updated as indicated.   Allergies and medications reviewed and updated.   Current Outpatient Medications:    cetirizine (ZYRTEC) 10 MG tablet, Take 10 mg by mouth daily., Disp: , Rfl:    Continuous Glucose Receiver (FREESTYLE LIBRE 3 READER) DEVI, Use as directed to continuously check sugar E11.9, Disp: 1 each, Rfl: 0   Continuous Glucose Sensor (FREESTYLE LIBRE 3 SENSOR) MISC, Place 1 sensor on the skin every 14 days. Use to check glucose continuously E11.9, Disp: 6 each, Rfl: 3   guanFACINE (INTUNIV) 2 MG TB24 ER tablet,  Take 2 mg by mouth daily., Disp: , Rfl:    hydrocortisone  2.5 % cream, APPLY TOPICALLY TO THE AFFECTED AREA TWICE DAILY, Disp: 30 g, Rfl: 0   ketoconazole  (NIZORAL ) 2 % shampoo, APPLY TOPICALLY 2 TIMES A WEEK, Disp: 120 mL, Rfl: 6   lisdexamfetamine (VYVANSE) 70 MG capsule, Take 70 mg by mouth every morning., Disp: , Rfl:    losartan  (COZAAR ) 25 MG tablet, TAKE 1 TABLET BY MOUTH DAILY, Disp: 90 tablet, Rfl: 3   metFORMIN  (GLUCOPHAGE -XR) 500 MG 24 hr tablet, TAKE 4 TABLETS BY MOUTH ONCE DAILY WITH BREAKFAST, Disp: 360 tablet, Rfl: 3   ondansetron  (ZOFRAN -ODT) 4 MG disintegrating tablet, Take 1 tablet (4 mg total) by mouth every 8 (eight) hours as needed for nausea or vomiting., Disp: 20 tablet, Rfl: 0   pantoprazole  (PROTONIX ) 40 MG tablet, Take 1 tablet (40 mg total) by mouth daily., Disp: 30 tablet, Rfl: 0   pioglitazone  (ACTOS ) 45 MG tablet, Take 1 tablet (45 mg total) by mouth daily., Disp: 90 tablet, Rfl: 3   Probiotic Product (ALIGN PO), Take 1 capsule by mouth daily. , Disp: , Rfl:    rosuvastatin  (CRESTOR ) 10 MG tablet, Take 1 tablet (10 mg total) by mouth daily., Disp: 90 tablet, Rfl: 3   Semaglutide ,0.25 or 0.5MG /DOS, 2 MG/3ML SOPN, Take 0.5 mg subcutaneous once weekly, Disp: 9 mL, Rfl: 3   solifenacin  (VESICARE ) 5 MG tablet, Take 1 tablet (5 mg total) by mouth  daily., Disp: 90 tablet, Rfl: 3   venlafaxine  XR (EFFEXOR -XR) 75 MG 24 hr capsule, Take 3 capsules (225 mg total) by mouth daily with breakfast., Disp: 270 capsule, Rfl: 3  Allergies  Allergen Reactions   Clomiphene Citrate     Other reaction(s): LFT elevation of  AST/ALT into 500's   Sulfa Antibiotics Other (See Comments)    Patient is not sure what reaction.     Objective:   BP 128/82   Pulse (!) 115   Temp 98.1 F (36.7 C)   Resp 18   Ht 5\' 8"  (1.727 m)   Wt 250 lb (113.4 kg)   SpO2 99%   BMI 38.01 kg/m    Physical Exam Vitals reviewed.  Constitutional:      General: He is not in acute distress.     Appearance: Normal appearance. He is not ill-appearing, toxic-appearing or diaphoretic.  HENT:     Head: Normocephalic and atraumatic.     Right Ear: Tympanic membrane, ear canal and external ear normal. There is no impacted cerumen.     Left Ear: Tympanic membrane, ear canal and external ear normal. There is no impacted cerumen.     Nose: Nose normal.     Right Sinus: No maxillary sinus tenderness or frontal sinus tenderness.     Left Sinus: No maxillary sinus tenderness or frontal sinus tenderness.     Mouth/Throat:     Mouth: Mucous membranes are moist.     Pharynx: Oropharynx is clear. No oropharyngeal exudate or posterior oropharyngeal erythema.     Tonsils: No tonsillar exudate or tonsillar abscesses.  Eyes:     General: No scleral icterus.       Right eye: No discharge.        Left eye: No discharge.     Conjunctiva/sclera: Conjunctivae normal.  Cardiovascular:     Rate and Rhythm: Normal rate.  Pulmonary:     Effort: Pulmonary effort is normal. No respiratory distress.  Musculoskeletal:        General: Normal range of motion.     Cervical back: Normal range of motion.  Lymphadenopathy:     Cervical: No cervical adenopathy.  Skin:    General: Skin is warm and dry.  Neurological:     Mental Status: He is alert and oriented to person, place, and time. Mental status is at baseline.  Psychiatric:        Mood and Affect: Mood normal.        Behavior: Behavior normal.        Thought Content: Thought content normal.        Judgment: Judgment normal.

## 2023-07-08 NOTE — Telephone Encounter (Signed)
    FYI Only or Action Required?: FYI only for provider  Patient was last seen in primary care on 03/26/2023 by Roslyn Coombe, MD. Called Nurse Triage reporting Headache. Symptoms began several days ago. Interventions attempted: OTC medications: advil . Symptoms are: stable.  Triage Disposition: No disposition on file.  Patient/caregiver understands and will follow disposition?: Copied from CRM [id number]. Topic: Clinical - Red Word Triage >> Jul 08, 2023  8:16 AM Gregory Owens wrote: Red Word that prompted transfer to Nurse Triage: Severe headache in the back of his head since Saturday, been taking OTC meds no relief. Reason for Disposition  [1] MODERATE headache (e.g., interferes with normal activities) AND [2] present > 24 hours AND [3] unexplained  (Exceptions: analgesics not tried, typical migraine, or headache part of viral illness)  Answer Assessment - Initial Assessment Questions 1. LOCATION: "Where does it hurt?"      Back of head on top 2. ONSET: "When did the headache start?" (Minutes, hours or days)      Saturday 3. PATTERN: "Does the pain come and go, or has it been constant since it started?"     Comes and goes and stays constant at times 4. SEVERITY: "How bad is the pain?" and "What does it keep you from doing?"  (e.g., Scale 1-10; mild, moderate, or severe)   - MILD (1-3): doesn't interfere with normal activities    - MODERATE (4-7): interferes with normal activities or awakens from sleep    - SEVERE (8-10): excruciating pain, unable to do any normal activities        7/10 throbbing 5. RECURRENT SYMPTOM: "Have you ever had headaches before?" If Yes, ask: "When was the last time?" and "What happened that time?"      Yes, after cleaning ear with a cotton swab 6. CAUSE: "What do you think is causing the headache?"     no 7. MIGRAINE: "Have you been diagnosed with migraine headaches?" If Yes, ask: "Is this headache similar?"      no 8. HEAD INJURY: "Has there been any recent injury  to the head?"      no 9. OTHER SYMPTOMS: "Do you have any other symptoms?" (fever, stiff neck, eye pain, sore throat, cold symptoms)     no 10. PREGNANCY: "Is there any chance you are pregnant?" "When was your last menstrual period?"       na  Protocols used: [facility]

## 2023-07-09 ENCOUNTER — Encounter: Payer: Self-pay | Admitting: Internal Medicine

## 2023-07-09 ENCOUNTER — Telehealth: Payer: Self-pay

## 2023-07-09 ENCOUNTER — Other Ambulatory Visit (HOSPITAL_COMMUNITY): Payer: Self-pay

## 2023-07-09 DIAGNOSIS — E1165 Type 2 diabetes mellitus with hyperglycemia: Secondary | ICD-10-CM

## 2023-07-09 NOTE — Telephone Encounter (Signed)
 Pharmacy Patient Advocate Encounter  Received notification from [facility] that Prior Authorization for Ozempic  has been APPROVED from 07/09/23 to 07/08/24. Ran test claim, Copay is $4.00 for 84 day supply. This test claim was processed through [facility]- copay amounts may vary at other pharmacies due to pharmacy/plan contracts, or as the patient moves through the different stages of their insurance plan.   PA #/Case ID/Reference #: Gregory Owens

## 2023-07-09 NOTE — Telephone Encounter (Signed)
 Pharmacy Patient Advocate Encounter   Received notification from Patient Pharmacy that prior authorization for Ozempic  is required/requested.   Insurance verification completed.   The patient is insured through [facility] .   Per test claim: PA required; PA submitted to above mentioned insurance via CoverMyMeds Key/confirmation #/EOC [id number] Status is pending

## 2023-07-11 ENCOUNTER — Telehealth: Payer: Self-pay

## 2023-07-11 ENCOUNTER — Other Ambulatory Visit (HOSPITAL_COMMUNITY): Payer: Self-pay

## 2023-07-11 NOTE — Telephone Encounter (Signed)
 Pharmacy Patient Advocate Encounter   Received notification from CoverMyMeds that prior authorization for FreeStyle Libre 3 Plus Sensor  is required/requested.   Insurance verification completed.   The patient is insured through [facility] .   Per test claim: PA required; PA submitted to above mentioned insurance via CoverMyMeds Key/confirmation #/EOC [id number] Status is pending

## 2023-07-12 ENCOUNTER — Other Ambulatory Visit (HOSPITAL_COMMUNITY): Payer: Self-pay

## 2023-07-15 ENCOUNTER — Encounter: Payer: Self-pay | Admitting: Internal Medicine

## 2023-07-15 ENCOUNTER — Ambulatory Visit: Admitting: Internal Medicine

## 2023-07-15 VITALS — BP 146/82 | HR 104 | Temp 98.2°F | Ht 68.0 in | Wt 247.0 lb

## 2023-07-15 DIAGNOSIS — E538 Deficiency of other specified B group vitamins: Secondary | ICD-10-CM

## 2023-07-15 DIAGNOSIS — E559 Vitamin D deficiency, unspecified: Secondary | ICD-10-CM

## 2023-07-15 DIAGNOSIS — R202 Paresthesia of skin: Secondary | ICD-10-CM | POA: Insufficient documentation

## 2023-07-15 DIAGNOSIS — Z7985 Long-term (current) use of injectable non-insulin antidiabetic drugs: Secondary | ICD-10-CM

## 2023-07-15 DIAGNOSIS — E1165 Type 2 diabetes mellitus with hyperglycemia: Secondary | ICD-10-CM | POA: Diagnosis not present

## 2023-07-15 MED ORDER — SEMAGLUTIDE (1 MG/DOSE) 4 MG/3ML ~~LOC~~ SOPN: 1.0000 mg | PEN_INJECTOR | SUBCUTANEOUS | 11 refills | Status: DC

## 2023-07-15 NOTE — Telephone Encounter (Signed)
 Pharmacy Patient Advocate Encounter  Received notification from [facility] that Prior Authorization for FreeStyle Libre 3 Plus Sensor  has been DENIED.  See denial reason below. No denial letter attached in CMM. Will attach denial letter to Media tab once received.   PA #/Case ID/Reference #: [id number]

## 2023-07-15 NOTE — Progress Notes (Signed)
 Patient ID: Gregory Owens, male   DOB: [birth year], 53 y.o.   MRN: [id number]        Chief Complaint: follow up bilateral feet paresthesias, dm, low vit d and b12       HPI:  Gregory Owens is a 53 y.o. male here with c/o several weeks burning tingling of bilateral toes and distal feet in the setting of long hx of uncontrolled dm, does not currently see podiatry, has no hx of vascular insufficiency Discomfort worse at bedtime,    Pt denies chest pain, increased sob or doe, wheezing, orthopnea, PND, increased LE swelling, palpitations, dizziness or syncope.   Pt denies polydipsia, polyuria, or new focal neuro s/s.          Wt Readings from Last 3 Encounters:  07/15/23 247 lb (112 kg)  07/08/23 250 lb (113.4 kg)  06/27/23 251 lb (113.9 kg)   BP Readings from Last 3 Encounters:  07/15/23 (!) 146/82  07/08/23 128/82  06/27/23 128/84         Past Medical History:  Diagnosis Date   Abnormal EKG    NS ST-T EKG changes with negative Stress cardiolite 2003   Anxiety    Dehydration 01/30/2003   [place] , [place]   Diabetes mellitus without complication (HCC)    GERD (gastroesophageal reflux disease)    Hyperglycemia    Hyperlipidemia    IBS (irritable bowel syndrome)    Obesity    OSA (obstructive sleep apnea)    resolved post surgery   Past Surgical History:  Procedure Laterality Date   CHOLECYSTECTOMY N/A 06/07/2019   Procedure: LAPAROSCOPIC CHOLECYSTECTOMY;  Surgeon: Melvenia Stabs, MD;  Location: [facility] OR;  Service: General;  Laterality: N/A;   COLONOSCOPY     TONSILLECTOMY AND ADENOIDECTOMY      with above   UVULOPALATOPHARYNGOPLASTY  01/29/2002   Post op bleeding complication;  Dr Alden Humphrey TOOTH EXTRACTION      reports that he has never smoked. He has never used smokeless tobacco. He reports that he does not drink alcohol and does not use drugs. family history includes Cirrhosis in his brother; Diabetes in his maternal grandmother and paternal  grandmother; Heart attack (age of onset: 33) in his maternal grandmother and paternal grandmother; Hyperlipidemia in his father and mother; Hypertension in his father and mother; Other in his mother; Prostate cancer in his paternal grandfather. Allergies  Allergen Reactions   Clomiphene Citrate     Other reaction(s): LFT elevation of  AST/ALT into 500's   Sulfa Antibiotics Other (See Comments)    Patient is not sure what reaction.    Current Outpatient Medications on File Prior to Visit  Medication Sig Dispense Refill   cetirizine (ZYRTEC) 10 MG tablet Take 10 mg by mouth daily.     Continuous Glucose Sensor (FREESTYLE LIBRE 3 SENSOR) MISC Place 1 sensor on the skin every 14 days. Use to check glucose continuously E11.9 6 each 3   guanFACINE (INTUNIV) 2 MG TB24 ER tablet Take 2 mg by mouth daily.     hydrocortisone  2.5 % cream Apply topically 2 (two) times daily. 30 g 0   ketoconazole  (NIZORAL ) 2 % shampoo Apply topically 2 (two) times a week. 120 mL 6   lisdexamfetamine (VYVANSE) 70 MG capsule Take 70 mg by mouth every morning.     losartan  (COZAAR ) 25 MG tablet TAKE 1 TABLET BY MOUTH DAILY 90 tablet 3   metFORMIN  (GLUCOPHAGE -XR) 500 MG 24 hr tablet TAKE  4 TABLETS BY MOUTH ONCE DAILY WITH BREAKFAST 360 tablet 3   Probiotic Product (ALIGN PO) Take 1 capsule by mouth daily.      rosuvastatin  (CRESTOR ) 10 MG tablet Take 1 tablet (10 mg total) by mouth daily. 90 tablet 3   solifenacin  (VESICARE ) 5 MG tablet Take 1 tablet (5 mg total) by mouth daily. 90 tablet 3   venlafaxine  XR (EFFEXOR -XR) 75 MG 24 hr capsule Take 3 capsules (225 mg total) by mouth daily with breakfast. 270 capsule 3   Continuous Glucose Receiver (FREESTYLE LIBRE 3 READER) DEVI Use as directed to continuously check sugar E11.9 1 each 0   ondansetron  (ZOFRAN -ODT) 4 MG disintegrating tablet Take 1 tablet (4 mg total) by mouth every 8 (eight) hours as needed for nausea or vomiting. 20 tablet 0   pantoprazole  (PROTONIX ) 40 MG  tablet Take 1 tablet (40 mg total) by mouth daily. 30 tablet 0   No current facility-administered medications on file prior to visit.        ROS:  All others reviewed and negative.  Objective        PE:  BP (!) 146/82 (BP Location: Left Arm, Patient Position: Sitting, Cuff Size: Normal)   Pulse (!) 104   Temp 98.2 F (36.8 C) (Oral)   Ht 5' 8 (1.727 m)   Wt 247 lb (112 kg)   SpO2 98%   BMI 37.56 kg/m                 Constitutional: Pt appears in NAD               HENT: Head: NCAT.                Right Ear: External ear normal.                 Left Ear: External ear normal.                Eyes: . Pupils are equal, round, and reactive to light. Conjunctivae and EOM are normal               Nose: without d/c or deformity               Neck: Neck supple. Gross normal ROM               Cardiovascular: Normal rate and regular rhythm.                 Pulmonary/Chest: Effort normal and breath sounds without rales or wheezing.                Abd:  Soft, NT, ND, + BS, no organomegaly               Neurological: Pt is alert. At baseline orientation, motor grossly intact               Skin: Skin is warm. No rashes, no other new lesions, LE edema - none, 1+ bialteral dorsalis pedis, has some decreased sensation to toes to LT.                 Psychiatric: Pt behavior is normal without agitation   Micro: none  Cardiac tracings I have personally interpreted today:  none  Pertinent Radiological findings (summarize): none   Lab Results  Component Value Date   WBC 7.2 03/29/2023   HGB 14.4 03/29/2023   HCT 44.3 03/29/2023   PLT 260.0 03/29/2023   GLUCOSE 205 (  H) 06/27/2023   CHOL 119 06/27/2023   TRIG 130.0 06/27/2023   HDL 35.10 (L) 06/27/2023   LDLDIRECT 53.0 08/29/2017   LDLCALC 58 06/27/2023   ALT 13 06/27/2023   AST 13 06/27/2023   NA 138 06/27/2023   K 4.0 06/27/2023   CL 102 06/27/2023   CREATININE 0.89 06/27/2023   BUN 12 06/27/2023   CO2 29 06/27/2023   TSH 0.91  03/29/2023   PSA 0.48 03/29/2023   HGBA1C 8.1 (H) 06/27/2023   MICROALBUR 1.7 03/29/2023   Assessment/Plan:  Gregory Owens is a 53 y.o. Black or African American [2] male with  has a past medical history of Abnormal EKG, Anxiety, Dehydration (01/30/2003), Diabetes mellitus without complication (HCC), GERD (gastroesophageal reflux disease), Hyperglycemia, Hyperlipidemia, IBS (irritable bowel syndrome), Obesity, and OSA (obstructive sleep apnea).  B12 deficiency Lab Results  Component Value Date   VITAMINB12 192 (L) 06/27/2023   Low, to start oral replacement - b12 1000 mcg qd   Diabetes (HCC) Lab Results  Component Value Date   HGBA1C 8.1 (H) 06/27/2023   ucnontrolled, pt for increased ozempic  1 mg weekly   Vitamin D  deficiency Last vitamin D  Lab Results  Component Value Date   VD25OH 17.99 (L) 06/27/2023   Low, to start oral replacement   Paresthesias Most c/w neuropathy possibly related to low b12 or DM, consider starting gabapentin  qhs  Followup: Return in about 3 months (around 10/15/2023).  Rosalia Colonel, MD 07/15/2023 7:48 PM [facility] - [facility]

## 2023-07-15 NOTE — Assessment & Plan Note (Signed)
 Lab Results  Component Value Date   VITAMINB12 192 (L) 06/27/2023   Low, to start oral replacement - b12 1000 mcg qd

## 2023-07-15 NOTE — Assessment & Plan Note (Addendum)
 Lab Results  Component Value Date   HGBA1C 8.1 (H) 06/27/2023   ucnontrolled, pt for increased ozempic  1 mg weekly, also refer podiatry

## 2023-07-15 NOTE — Patient Instructions (Addendum)
 Ok to increase the ozempic  to 1 mg weekly  Please continue all other medications as before, and refills have been done if requested.  Please have the pharmacy call with any other refills you may need.  Please continue your efforts at being more active, low cholesterol diet, and weight control..  Please keep your appointments with your specialists as you may have planned  You will be contacted regarding the referral for: podiatry  Please make an Appointment to return in 3 months, or sooner if needed

## 2023-07-15 NOTE — Assessment & Plan Note (Signed)
 Last vitamin D  Lab Results  Component Value Date   VD25OH 17.99 (L) 06/27/2023   Low, to start oral replacement

## 2023-07-15 NOTE — Assessment & Plan Note (Signed)
 Most c/w neuropathy possibly related to low b12 or DM, consider starting gabapentin  qhs

## 2023-07-18 ENCOUNTER — Other Ambulatory Visit (HOSPITAL_COMMUNITY): Payer: Self-pay

## 2023-07-19 ENCOUNTER — Encounter: Payer: Self-pay | Admitting: Podiatry

## 2023-07-19 ENCOUNTER — Ambulatory Visit (INDEPENDENT_AMBULATORY_CARE_PROVIDER_SITE_OTHER): Admitting: Podiatry

## 2023-07-19 ENCOUNTER — Telehealth: Payer: Self-pay | Admitting: Pharmacist

## 2023-07-19 DIAGNOSIS — M7751 Other enthesopathy of right foot: Secondary | ICD-10-CM

## 2023-07-19 NOTE — Telephone Encounter (Signed)
 Appeal has been submitted for FreeStyle Libre 3 Plus. Will advise when response is received, please be advised that most companies may take 30 days to make a decision. Appeal letter and supporting documentation have been sent to insurance company on 07/19/2023 @11 :04 am.  Thank you, Dene Fines, PharmD Clinical Pharmacist  [place]  Direct Dial: [phone]

## 2023-07-22 NOTE — Progress Notes (Addendum)
 Subjective:   Patient ID: Gregory Owens, male   DOB: 53 y.o.   MRN: [id number]   HPI Patient presents stating that he has had a lot of burning and shooting in both of his feet and he gets irritation in his toes that can be hard for him to wear shoe gear.  Patient is not currently smoking diabetes that has improved from previous visit but still is running slightly high and is not active   Review of Systems  All other systems reviewed and are negative.       Objective:  Physical Exam Vitals and nursing note reviewed.  Constitutional:      Appearance: He is well-developed.  Pulmonary:     Effort: Pulmonary effort is normal.   Musculoskeletal:        General: Normal range of motion.   Skin:    General: Skin is warm.   Neurological:     Mental Status: He is alert.     Neurovascular status found to be intact muscle strength was reduced range of motion subtalar joint midtarsal joint reduced.  Patient does have diminishment of sharp dull vibratory bilateral and has moderate flatfoot deformity with digital irritation noted bilateral with inflammatory changes between the toes.  Good digital perfusion well-oriented inflow     Assessment:  Inflammatory capsulitis of the digit with neuropathy present in poor health overall     Plan:  HNP all conditions reviewed and at this point I have recommended wider shoes mesh materials and discussed possibility for future injection or possible medication for neuropathy.  Patient will be seen back to recheck as needed  No xray

## 2023-07-23 ENCOUNTER — Telehealth: Payer: Self-pay | Admitting: Podiatry

## 2023-07-23 NOTE — Telephone Encounter (Signed)
 Patient stated AVS documentation and clinical notes did not reflect visit on 07/19/23. Patient felt information is not accurate to his visit. Patient would like the correct AVS notes for his visit. Patient contact telephone number, [phone]

## 2023-07-24 NOTE — Telephone Encounter (Signed)
 I took the xray off

## 2023-07-24 NOTE — Telephone Encounter (Signed)
 I called and spoke to Gregory Owens. He stated it's not a problem with the AVS, it's the chart note. He stated he did not have an xray done and he was seen for tingling in his feet.

## 2023-07-28 ENCOUNTER — Other Ambulatory Visit: Payer: Self-pay | Admitting: Internal Medicine

## 2023-07-29 ENCOUNTER — Telehealth: Payer: Self-pay | Admitting: Internal Medicine

## 2023-07-29 ENCOUNTER — Encounter: Payer: Self-pay | Admitting: Internal Medicine

## 2023-07-29 ENCOUNTER — Other Ambulatory Visit: Payer: Self-pay

## 2023-07-29 NOTE — Telephone Encounter (Signed)
 Copied from CRM [id number]. Topic: Clinical - Medication Prior Auth >> Jul 29, 2023  3:33 PM Precious C wrote: Reason for CRM: [place] from [employer]acy [employer] ([employer]) called regarding Herlene 3 sensor for pt. She requested that the provider send additional clinical information for a new pre-authorization. Contact: [phone] (phone)  [phone] (fax). When calling, please state it is for a new pre-auth request.

## 2023-07-30 NOTE — Addendum Note (Signed)
 Addended by: NORLEEN LYNWOOD ORN on: 07/30/2023 03:33 PM   Modules accepted: Orders

## 2023-07-31 NOTE — Telephone Encounter (Signed)
 Called to check the status of the appeal.   Per the rep, the appeal has a turnaround time of up to 30 days and it is due for determination on 08/22/23.  Phone# [phone]

## 2023-08-01 NOTE — Telephone Encounter (Signed)
 Message has been sent to Pt via another Mychart message.

## 2023-08-01 NOTE — Telephone Encounter (Signed)
This has been noted.

## 2023-08-05 ENCOUNTER — Ambulatory Visit (INDEPENDENT_AMBULATORY_CARE_PROVIDER_SITE_OTHER): Admitting: Podiatry

## 2023-08-05 ENCOUNTER — Encounter: Payer: Self-pay | Admitting: Podiatry

## 2023-08-05 DIAGNOSIS — E1142 Type 2 diabetes mellitus with diabetic polyneuropathy: Secondary | ICD-10-CM

## 2023-08-05 NOTE — Progress Notes (Signed)
  Subjective:  Patient ID: Gregory Owens, male    DOB: 1970/04/27,   MRN: [id number]  Chief Complaint  Patient presents with   Peripheral Neuropathy    Tingling in both feet. NIDDM 8.1. Patient wants 2nd opinion.    54 y.o. male presents for concern of tingling in his feet. This has been ongoing for while now.  Relates numbness and tingling in their feet. Relates this comes and goes. Was recently seen by Dr. Magdalen but relates he still had concerns about notes and wanted to make sure his feet were ok. Does relate a spike in his blood sugars back in November up to over 10 A1c.  Patient is diabetic and last A1c was  Lab Results  Component Value Date   HGBA1C 8.1 (H) 06/27/2023   .   PCP:  Norleen Lynwood ORN, MD    . Denies any other pedal complaints. Denies n/v/f/c.   Past Medical History:  Diagnosis Date   Abnormal EKG    NS ST-T EKG changes with negative Stress cardiolite 2003   Anxiety    Dehydration 01/30/2003   [place] , [place]   Diabetes mellitus without complication (HCC)    GERD (gastroesophageal reflux disease)    Hyperglycemia    Hyperlipidemia    IBS (irritable bowel syndrome)    Obesity    OSA (obstructive sleep apnea)    resolved post surgery    Objective:  Physical Exam: Vascular: DP/PT pulses 2/4 bilateral. CFT <3 seconds. No edema noted to bilateral lower extremities. Skin. No lacerations or abrasions bilateral feet. Nails 1-5 bilateral  are  normal in appearance.  Musculoskeletal: MMT 5/5 bilateral lower extremities in DF, PF, Inversion and Eversion. Deceased ROM in DF of ankle joint.  Neurological: Sensation intact to light touch. Protective sensation intact bilateral.    Assessment:   1. Type 2 diabetes mellitus with peripheral neuropathy (HCC)      Plan:  Patient was evaluated and treated and all questions answered. Discussed neuropathy and etiology as well as treatment with patient.  -Discussed and educated patient on diabetic foot care,  especially with  regards to the vascular, neurological and musculoskeletal systems.  -Stressed the importance of good glycemic control and the detriment of not  controlling glucose levels in relation to the foot. -Discussed supportive shoes at all times and checking feet regularly.  -Discussed gabapentin  but would like to hold off for now as pain is intermittent and may resolve with improved sugar levels.   -Patient to return in 1 year for DM foot check.    Asberry Failing, DPM

## 2023-08-08 ENCOUNTER — Encounter: Payer: Self-pay | Admitting: Internal Medicine

## 2023-08-27 ENCOUNTER — Other Ambulatory Visit (HOSPITAL_COMMUNITY): Payer: Self-pay

## 2023-08-27 NOTE — Telephone Encounter (Signed)
 Per phone conversation with insurance, the appeal was denied.  They mailed a copy of the denial directly to the patient.  They were unable to fax a copy to us  or provide the reason it was denied.

## 2023-08-28 ENCOUNTER — Other Ambulatory Visit: Payer: Self-pay

## 2023-08-28 ENCOUNTER — Ambulatory Visit
Admission: EM | Admit: 2023-08-28 | Discharge: 2023-08-28 | Disposition: A | Discharge status: Home / Self Care | Attending: Family Medicine | Admitting: Family Medicine

## 2023-08-28 DIAGNOSIS — Z23 Encounter for immunization: Secondary | ICD-10-CM | POA: Diagnosis not present

## 2023-08-28 DIAGNOSIS — W540XXA Bitten by dog, initial encounter: Secondary | ICD-10-CM | POA: Diagnosis not present

## 2023-08-28 DIAGNOSIS — S41152A Open bite of left upper arm, initial encounter: Secondary | ICD-10-CM | POA: Diagnosis not present

## 2023-08-28 MED ORDER — TETANUS-DIPHTH-ACELL PERTUSSIS 5-2.5-18.5 LF-MCG/0.5 IM SUSY
0.5000 mL | PREFILLED_SYRINGE | Freq: Once | INTRAMUSCULAR | Status: AC
  Administered 2023-08-28: 0.5 mL via INTRAMUSCULAR

## 2023-08-28 MED ORDER — AMOXICILLIN-POT CLAVULANATE 875-125 MG PO TABS: 1.0000 | ORAL_TABLET | Freq: Two times a day (BID) | ORAL | 0 refills | Status: AC

## 2023-08-28 MED ORDER — RABIES IMMUNE GLOBULIN 300 UNIT/2ML IJ SOLN
20.0000 [IU]/kg | Freq: Once | INTRAMUSCULAR | Status: AC
  Administered 2023-08-28: 2400 [IU]

## 2023-08-28 MED ORDER — RABIES VACCINE, PCEC IM SUSR
1.0000 mL | Freq: Once | INTRAMUSCULAR | Status: AC
  Administered 2023-08-28: 1 mL via INTRAMUSCULAR

## 2023-08-28 NOTE — ED Triage Notes (Signed)
 Pt was bitten by his dad's dog in the left upper arm an hour ago. Pt has a bite mark in left anterior upper arm. The area is ecchymotic. Pt states he was told the dr is up-to-date on vaccines

## 2023-08-28 NOTE — ED Provider Notes (Addendum)
 [facility]    CSN: [id number] Arrival date & time: 08/28/23  1535      History   Chief Complaint No chief complaint on file.   HPI Gregory Owens is a 53 y.o. male with a past medical history of hyperlipidemia IBS, OSA, diabetes who presents for dog bite.  Patient reports 1 to 2 hours prior to arrival he was bitten by a family dog on the left upper arm.  He does not know if the dog is up-to-date on its rabies vaccine .  Patient is not up-to-date on his tetanus.  No wound care was done prior to arrival.  No other injuries or concerns at this time  HPI  Past Medical History:  Diagnosis Date   Abnormal EKG    NS ST-T EKG changes with negative Stress cardiolite 2003   Anxiety    Dehydration 01/30/2003   [place] , [place]   Diabetes mellitus without complication (HCC)    GERD (gastroesophageal reflux disease)    Hyperglycemia    Hyperlipidemia    IBS (irritable bowel syndrome)    Obesity    OSA (obstructive sleep apnea)    resolved post surgery    Patient Active Problem List   Diagnosis Date Noted   Paresthesias 07/15/2023   B12 deficiency 06/30/2022   Other fatigue 06/30/2022   History of colonic polyps 06/30/2022   Non-recurrent acute serous otitis media of left ear 01/25/2022   Right knee pain 01/06/2022   Abdominal pain, generalized 08/02/2021   Abnormal LFTs 02/17/2021   Abnormal serum lipase level 02/17/2021   Dark urine 12/16/2020   Body mass index (BMI) 38.0-38.9, adult 03/29/2020   Hyperglycemia due to type 2 diabetes mellitus (HCC) 03/29/2020   Metabolic syndrome 03/29/2020   Nonalcoholic steatohepatitis (NASH) 03/29/2020   Vitamin D  deficiency 03/29/2020   Low back pain 03/29/2020   Right flank pain, chronic 02/08/2020   Right lower quadrant abdominal pain 12/20/2019   Hematochezia 07/09/2019   Cholecystitis 06/07/2019   S/P laparoscopic cholecystectomy 06/07/2019   Headache 02/08/2019   Urinary urgency 11/12/2018   Hypogonadism in  male 09/04/2017   Bilateral foot pain 03/08/2017   Dysphagia 08/29/2016   ADHD 03/28/2016   Morbid obesity (HCC) 12/06/2015   Achrochordon 10/11/2015   Seborrheic dermatitis 10/11/2015   Encounter for well adult exam with abnormal findings 03/10/2015   Skin lesion 03/10/2015   GERD (gastroesophageal reflux disease) 11/04/2013   Tachycardia 11/04/2013   Nonspecific abnormal electrocardiogram (ECG) (EKG) 11/04/2013   Non-compliant behavior 10/14/2013   Diabetes (HCC) 08/19/2012   PLMD (periodic limb movement disorder) 05/30/2012   SLEEP DISORDER 12/05/2009   Hyperlipidemia 08/25/2009   GAD (generalized anxiety disorder) 08/25/2009   Obstructive sleep apnea 08/25/2009   RHINITIS 05/24/2009    Past Surgical History:  Procedure Laterality Date   CHOLECYSTECTOMY N/A 06/07/2019   Procedure: LAPAROSCOPIC CHOLECYSTECTOMY;  Surgeon: Teresa Lonni HERO, MD;  Location: [facility] OR;  Service: General;  Laterality: N/A;   COLONOSCOPY     TONSILLECTOMY AND ADENOIDECTOMY      with above   UVULOPALATOPHARYNGOPLASTY  01/29/2002   Post op bleeding complication;  Dr Roark QUERY TOOTH EXTRACTION         Home Medications    Prior to Admission medications   Medication Sig Start Date End Date Taking? Authorizing Provider  amoxicillin -clavulanate (AUGMENTIN ) 875-125 MG tablet Take 1 tablet by mouth every 12 (twelve) hours for 5 days. 08/28/23 09/02/23 Yes Milos Milligan, Jodi R, NP  cetirizine (ZYRTEC) 10  MG tablet Take 10 mg by mouth daily.    [provider]  Continuous Glucose Sensor (FREESTYLE LIBRE 3 SENSOR) MISC Place 1 sensor on the skin every 14 days. Use to check glucose continuously E11.9 07/08/23   Norleen Lynwood ORN, MD  guanFACINE (INTUNIV) 2 MG TB24 ER tablet Take 2 mg by mouth daily. 01/31/23   [provider]  hydrocortisone  2.5 % cream Apply topically 2 (two) times daily. 07/08/23   Norleen Lynwood ORN, MD  ketoconazole  (NIZORAL ) 2 % shampoo Apply topically 2 (two) times a week. 07/08/23    Norleen Lynwood ORN, MD  lisdexamfetamine (VYVANSE) 70 MG capsule Take 70 mg by mouth every morning. 01/31/23   [provider]  losartan  (COZAAR ) 25 MG tablet TAKE 1 TABLET BY MOUTH DAILY 05/22/23   Norleen Lynwood ORN, MD  metFORMIN  (GLUCOPHAGE -XR) 500 MG 24 hr tablet TAKE 4 TABLETS BY MOUTH ONCE DAILY WITH BREAKFAST 07/08/23   Norleen Lynwood ORN, MD  ondansetron  (ZOFRAN -ODT) 4 MG disintegrating tablet Take 1 tablet (4 mg total) by mouth every 8 (eight) hours as needed for nausea or vomiting. 07/08/23   Norleen Lynwood ORN, MD  pantoprazole  (PROTONIX ) 40 MG tablet Take 1 tablet (40 mg total) by mouth daily. 03/28/23   Gladis Elsie BROCKS, PA-C  Probiotic Product (ALIGN PO) Take 1 capsule by mouth daily.     [provider]  rosuvastatin  (CRESTOR ) 10 MG tablet TAKE 1 TABLET BY MOUTH DAILY 07/29/23   Norleen Lynwood ORN, MD  Semaglutide , 1 MG/DOSE, 4 MG/3ML SOPN Inject 1 mg as directed once a week. 07/15/23   Norleen Lynwood ORN, MD  solifenacin  (VESICARE ) 5 MG tablet Take 1 tablet (5 mg total) by mouth daily. 07/08/23   Norleen Lynwood ORN, MD  venlafaxine  XR (EFFEXOR -XR) 75 MG 24 hr capsule Take 3 capsules (225 mg total) by mouth daily with breakfast. 09/26/22   Norleen Lynwood ORN, MD    Family History Family History  Problem Relation Age of Onset   Hyperlipidemia Mother    Hypertension Mother    Other Mother        Inflammatory Occipital Lymphadenitis   Hypertension Father    Hyperlipidemia Father    Cirrhosis Brother        autoimmune hepatitis (twin)   Diabetes Maternal Grandmother    Heart attack Maternal Grandmother 37   Diabetes Paternal Grandmother    Heart attack Paternal Grandmother 32   Prostate cancer Paternal Grandfather    Stomach cancer Neg Hx    Esophageal cancer Neg Hx    Rectal cancer Neg Hx    Colon cancer Neg Hx     Social History Social History   Tobacco Use   Smoking status: Never   Smokeless tobacco: Never  Vaping Use   Vaping status: Never Used  Substance Use Topics   Alcohol use: No    Drug use: No     Allergies   Clomiphene citrate and Sulfa antibiotics   Review of Systems Review of Systems  Skin:        Dog bite arm      Physical Exam Triage Vital Signs ED Triage Vitals  Encounter Vitals Group     BP 08/28/23 1604 127/84     Girls Systolic BP Percentile --      Girls Diastolic BP Percentile --      Boys Systolic BP Percentile --      Boys Diastolic BP Percentile --      Pulse Rate 08/28/23 1604 (!)  115     Resp 08/28/23 1604 17     Temp 08/28/23 1604 98.2 F (36.8 C)     Temp Source 08/28/23 1604 Oral     SpO2 08/28/23 1604 94 %     Weight 08/28/23 1627 252 lb 3.2 oz (114.4 kg)     Height --      Head Circumference --      Peak Flow --      Pain Score 08/28/23 1603 0     Pain Loc --      Pain Education --      Exclude from Growth Chart --    No data found.  Updated Vital Signs BP 127/84   Pulse (!) 115   Temp 98.2 F (36.8 C) (Oral)   Resp 17   Wt 252 lb 3.2 oz (114.4 kg)   SpO2 94%   BMI 38.35 kg/m   Visual Acuity Right Eye Distance:   Left Eye Distance:   Bilateral Distance:    Right Eye Near:   Left Eye Near:    Bilateral Near:     Physical Exam Vitals and nursing note reviewed.  Constitutional:      General: He is not in acute distress.    Appearance: Normal appearance. He is not ill-appearing.  HENT:     Head: Normocephalic and atraumatic.  Eyes:     Pupils: Pupils are equal, round, and reactive to light.  Cardiovascular:     Rate and Rhythm: Normal rate.  Pulmonary:     Effort: Pulmonary effort is normal.  Skin:    General: Skin is warm and dry.         Comments: Dog bite to left upper arm.  No bleeding swelling or drainage.  See photo.  Neurological:     General: No focal deficit present.     Mental Status: He is alert and oriented to person, place, and time.  Psychiatric:        Mood and Affect: Mood normal.        Behavior: Behavior normal.      UC Treatments / Results  Labs (all labs ordered are  listed, but only abnormal results are displayed) Labs Reviewed - No data to display  Basic metabolic panel Order: [id number]  Status: Final result     Next appt: None     Dx: Type 2 diabetes mellitus with hypergl...   Test Result Released: Yes (seen)     Messages: Seen   2 Result Notes     1 Patient Communication     View Follow-Up Encounter     1 HM Topic          Component Ref Range & Units (hover) 2 mo ago (06/27/23) 5 mo ago (03/29/23) 6 mo ago (02/27/23) 6 mo ago (02/27/23) 11 mo ago (09/26/22) 1 yr ago (06/29/22) 1 yr ago (01/03/22)  Sodium 138 136 136 R 134 Low  R 132 Low  138 137  Potassium 4.0 3.8 4.1 R 4.1 R 3.8 3.7 3.6  Chloride 102 99  98 R 97 100 101  CO2 29 29  26  R 27 30 30   Glucose, Bld 205 High  209 High   483 High  CM 432 High  255 High  263 High   BUN 12 13  18  R 13 12 12   Creatinine, Ser 0.89 0.86  0.96 R 0.95 0.94 0.94  GFR 97.48 98.67 CM   91.53 CM 92.86 CM 93.18 CM  Comment:  Calculated using the CKD-EPI Creatinine Equation (2021)  Calcium  9.1 8.3 Low   9.9 R 8.9 8.5 8.8  Resulting Agency [place] [facility] [place] [facility] [place] [facility] [place] [facility] [place] [facility]        Specimen Collected: 06/27/23 08:55 Last Resulted: 06/27/23 12:04    EKG   Radiology No results found.  Procedures Procedures (including critical care time)  Medications Ordered in UC Medications  rabies immune globulin  (HYPERRAB) injection 2,400 Units (has no administration in time range)  rabies vaccine  (RABAVERT ) injection 1 mL (has no administration in time range)  Tdap (BOOSTRIX) injection 0.5 mL (has no administration in time range)    Initial Impression / Assessment and Plan / UC Course  I have reviewed the triage vital signs and the nursing notes.  Pertinent labs & imaging results that were available during my care of the patient were reviewed by me and considered in my medical decision making (see chart for details).  Clinical Course  as of 08/28/23 1635  Wed Aug 28, 2023  1635 HR recheck 96 [JM]    Clinical Course User Index [JM] Loreda Myla SAUNDERS, NP    Reviewed exam and symptoms with patient.  No red flags.  Wound was cleansed and dressed by nursing staff.  Patient tetanus was updated in clinic.  As vaccination status of the dog is unknown patient agreeable to proceed with rabies vaccine .  He will return for subsequent vaccines on day 3 7 and 14.  Wound care/signs symptoms of infection reviewed and patient verbalized understanding.  Will start Augmentin  twice daily.  PCP follow-up if symptoms do not improve.  ER precautions reviewed and patient verbalized understanding. Final Clinical Impressions(s) / UC Diagnoses   Final diagnoses:  Dog bite of left upper extremity, initial encounter     Discharge Instructions      Keep the wound clean and dry.  Start Augmentin  twice daily for 5 days to help prevent infection.  Your tetanus has been updated in the clinic today.  You have also been started on the rabies vaccine .  Please return to the clinic on day 3, day 7, and day 14 for remaining rabies vaccines.  Please follow-up with your PCP if your your symptoms do not improve.  Please go to the ER if you develop any worsening symptoms such as fever, chills, redness swelling drainage or warmth from the bite site, or any new concerns that arise.  Hope you feel better soon!     ED Prescriptions     Medication Sig Dispense Auth. Provider   amoxicillin -clavulanate (AUGMENTIN ) 875-125 MG tablet Take 1 tablet by mouth every 12 (twelve) hours for 5 days. 10 tablet Jalilah Wiltsie, Jodi R, NP      PDMP not reviewed this encounter.   Loreda Myla SAUNDERS, NP 08/28/23 1635    Loreda Myla SAUNDERS, NP 08/28/23 [phone]

## 2023-08-28 NOTE — Discharge Instructions (Addendum)
 Keep the wound clean and dry.  Start Augmentin  twice daily for 5 days to help prevent infection.  Your tetanus has been updated in the clinic today.  You have also been started on the rabies vaccine .  Please return to the clinic on day 3, day 7, and day 14 for remaining rabies vaccines.  Please follow-up with your PCP if your your symptoms do not improve.  Please go to the ER if you develop any worsening symptoms such as fever, chills, redness swelling drainage or warmth from the bite site, or any new concerns that arise.  Hope you feel better soon!

## 2023-08-31 ENCOUNTER — Ambulatory Visit
Admission: EM | Admit: 2023-08-31 | Discharge: 2023-08-31 | Disposition: A | Discharge status: Home / Self Care | Attending: Urgent Care | Admitting: Urgent Care

## 2023-08-31 DIAGNOSIS — Z23 Encounter for immunization: Secondary | ICD-10-CM

## 2023-08-31 MED ORDER — RABIES VACCINE, PCEC IM SUSR
1.0000 mL | Freq: Once | INTRAMUSCULAR | Status: AC
  Administered 2023-08-31: 1 mL via INTRAMUSCULAR

## 2023-08-31 NOTE — ED Triage Notes (Signed)
 Pt presents for rabies vaccine  #2-denies reaction-NAD-steady gait

## 2023-09-04 ENCOUNTER — Ambulatory Visit
Admission: RE | Admit: 2023-09-04 | Discharge: 2023-09-04 | Disposition: A | Discharge status: Home / Self Care | Source: Ambulatory Visit | Attending: Urgent Care | Admitting: Urgent Care

## 2023-09-04 DIAGNOSIS — Z203 Contact with and (suspected) exposure to rabies: Secondary | ICD-10-CM | POA: Diagnosis not present

## 2023-09-04 DIAGNOSIS — Z23 Encounter for immunization: Secondary | ICD-10-CM

## 2023-09-04 MED ORDER — RABIES VACCINE, PCEC IM SUSR
1.0000 mL | Freq: Once | INTRAMUSCULAR | Status: AC
  Administered 2023-09-04: 1 mL via INTRAMUSCULAR

## 2023-09-04 NOTE — ED Triage Notes (Signed)
 Pt presents for rabies vaccine  #3-denies reactions-NAD-steady gait

## 2023-09-05 ENCOUNTER — Other Ambulatory Visit (HOSPITAL_COMMUNITY): Payer: Self-pay

## 2023-09-05 ENCOUNTER — Telehealth: Payer: Self-pay

## 2023-09-05 NOTE — Telephone Encounter (Signed)
 Pharmacy Patient Advocate Encounter   Received notification from Pt Calls Messages that prior authorization for Dexcom G7 sensor is required/requested.   Insurance verification completed.   The patient is insured through [facility] .   Per test claim: PA required; PA submitted to above mentioned insurance via CoverMyMeds Key/confirmation #/EOC [id number] Status is pending

## 2023-09-06 ENCOUNTER — Other Ambulatory Visit (HOSPITAL_COMMUNITY): Payer: Self-pay

## 2023-09-06 NOTE — Telephone Encounter (Signed)
 Called and spoke with patient. Informed him of denial for Dexcom glucometer. He expressed understanding and noted he would be changing insurances soon and would try again then

## 2023-09-06 NOTE — Telephone Encounter (Signed)
 Pharmacy Patient Advocate Encounter  Received notification from [facility] that Prior Authorization for Dexcom G7 Sensor has been DENIED.  See denial reason below. No denial letter attached in CMM. Will attach denial letter to Media tab once received.   PA #/Case ID/Reference #: [id number]

## 2023-11-04 ENCOUNTER — Encounter: Payer: Self-pay | Admitting: Internal Medicine

## 2023-11-05 ENCOUNTER — Other Ambulatory Visit: Payer: Self-pay

## 2023-11-05 MED ORDER — LOSARTAN POTASSIUM 25 MG PO TABS: 25.0000 mg | ORAL_TABLET | Freq: Every day | ORAL | 3 refills | Status: AC

## 2023-11-11 ENCOUNTER — Other Ambulatory Visit: Payer: Self-pay

## 2023-11-11 ENCOUNTER — Ambulatory Visit
Admission: EM | Admit: 2023-11-11 | Discharge: 2023-11-11 | Disposition: A | Discharge status: Home / Self Care | Attending: Family Medicine | Admitting: Family Medicine

## 2023-11-11 DIAGNOSIS — B3742 Candidal balanitis: Secondary | ICD-10-CM

## 2023-11-11 LAB — GLUCOSE, POCT (MANUAL RESULT ENTRY): POC Glucose: 134 mg/dL — AB (ref 70–99)

## 2023-11-11 MED ORDER — CLOTRIMAZOLE 1 % EX CREA: TOPICAL_CREAM | CUTANEOUS | 0 refills | Status: AC

## 2023-11-11 NOTE — Discharge Instructions (Signed)
 Keep the area clean and dry and start clotrimazole  topically twice daily.  Please follow-up with your PCP in 2 to 3 days for recheck.  Please go to the ER for any worsening symptoms.  I hope you feel better soon!

## 2023-11-11 NOTE — ED Triage Notes (Signed)
 Pt states the penis area right below the tip on the shaft area has a white substance when he wipes itx3d.

## 2023-11-11 NOTE — ED Provider Notes (Signed)
 [facility]    CSN: [id number] Arrival date & time: 11/11/23  1908      History   Chief Complaint No chief complaint on file.   HPI Gregory Owens is a 53 y.o. male presents for penile irritation.  Patient reports 3 days of a white substance on the underside of his penis for 3 days.  States it is sore.  Denies any swelling, penile discharge, testicular pain or swelling, STD concern.  Does have a history of diabetes and has had candidal balanitis in the past and states this feels similar.  No dysuria.  No other concerns at this time  HPI  Past Medical History:  Diagnosis Date   Abnormal EKG    NS ST-T EKG changes with negative Stress cardiolite 2003   Anxiety    Dehydration 01/30/2003   [place] , [place]   Diabetes mellitus without complication (HCC)    GERD (gastroesophageal reflux disease)    Hyperglycemia    Hyperlipidemia    IBS (irritable bowel syndrome)    Obesity    OSA (obstructive sleep apnea)    resolved post surgery    Patient Active Problem List   Diagnosis Date Noted   Paresthesias 07/15/2023   B12 deficiency 06/30/2022   Other fatigue 06/30/2022   History of colonic polyps 06/30/2022   Non-recurrent acute serous otitis media of left ear 01/25/2022   Right knee pain 01/06/2022   Abdominal pain, generalized 08/02/2021   Abnormal LFTs 02/17/2021   Abnormal serum lipase level 02/17/2021   Dark urine 12/16/2020   Body mass index (BMI) 38.0-38.9, adult 03/29/2020   Hyperglycemia due to type 2 diabetes mellitus (HCC) 03/29/2020   Metabolic syndrome 03/29/2020   Nonalcoholic steatohepatitis (NASH) 03/29/2020   Vitamin D  deficiency 03/29/2020   Low back pain 03/29/2020   Right flank pain, chronic 02/08/2020   Right lower quadrant abdominal pain 12/20/2019   Hematochezia 07/09/2019   Cholecystitis 06/07/2019   S/P laparoscopic cholecystectomy 06/07/2019   Headache 02/08/2019   Urinary urgency 11/12/2018   Hypogonadism in male 09/04/2017    Bilateral foot pain 03/08/2017   Dysphagia 08/29/2016   ADHD 03/28/2016   Morbid obesity (HCC) 12/06/2015   Achrochordon 10/11/2015   Seborrheic dermatitis 10/11/2015   Encounter for well adult exam with abnormal findings 03/10/2015   Skin lesion 03/10/2015   GERD (gastroesophageal reflux disease) 11/04/2013   Tachycardia 11/04/2013   Nonspecific abnormal electrocardiogram (ECG) (EKG) 11/04/2013   Non-compliant behavior 10/14/2013   Diabetes (HCC) 08/19/2012   PLMD (periodic limb movement disorder) 05/30/2012   SLEEP DISORDER 12/05/2009   Hyperlipidemia 08/25/2009   GAD (generalized anxiety disorder) 08/25/2009   Obstructive sleep apnea 08/25/2009   RHINITIS 05/24/2009    Past Surgical History:  Procedure Laterality Date   CHOLECYSTECTOMY N/A 06/07/2019   Procedure: LAPAROSCOPIC CHOLECYSTECTOMY;  Surgeon: Teresa Lonni HERO, MD;  Location: [facility] OR;  Service: General;  Laterality: N/A;   COLONOSCOPY     TONSILLECTOMY AND ADENOIDECTOMY      with above   UVULOPALATOPHARYNGOPLASTY  01/29/2002   Post op bleeding complication;  Dr Roark QUERY TOOTH EXTRACTION         Home Medications    Prior to Admission medications   Medication Sig Start Date End Date Taking? Authorizing Provider  clotrimazole  (LOTRIMIN ) 1 % cream Apply to affected area 2 times daily 11/11/23  Yes Fatin Bachicha, Jodi R, NP  cetirizine (ZYRTEC) 10 MG tablet Take 10 mg by mouth daily.    [provider]  Continuous Glucose Sensor (FREESTYLE LIBRE 3 SENSOR) MISC Place 1 sensor on the skin every 14 days. Use to check glucose continuously E11.9 07/08/23   Norleen Lynwood ORN, MD  guanFACINE (INTUNIV) 2 MG TB24 ER tablet Take 2 mg by mouth daily. 01/31/23   [provider]  hydrocortisone  2.5 % cream Apply topically 2 (two) times daily. 07/08/23   Norleen Lynwood ORN, MD  ketoconazole  (NIZORAL ) 2 % shampoo Apply topically 2 (two) times a week. 07/08/23   Norleen Lynwood ORN, MD  lisdexamfetamine (VYVANSE) 70 MG capsule Take  70 mg by mouth every morning. 01/31/23   [provider]  losartan  (COZAAR ) 25 MG tablet Take 1 tablet (25 mg total) by mouth daily. 11/05/23   Norleen Lynwood ORN, MD  metFORMIN  (GLUCOPHAGE -XR) 500 MG 24 hr tablet TAKE 4 TABLETS BY MOUTH ONCE DAILY WITH BREAKFAST 07/08/23   Norleen Lynwood ORN, MD  ondansetron  (ZOFRAN -ODT) 4 MG disintegrating tablet Take 1 tablet (4 mg total) by mouth every 8 (eight) hours as needed for nausea or vomiting. 07/08/23   Norleen Lynwood ORN, MD  pantoprazole  (PROTONIX ) 40 MG tablet Take 1 tablet (40 mg total) by mouth daily. 03/28/23   Gladis Elsie BROCKS, PA-C  Probiotic Product (ALIGN PO) Take 1 capsule by mouth daily.     [provider]  rosuvastatin  (CRESTOR ) 10 MG tablet TAKE 1 TABLET BY MOUTH DAILY 07/29/23   Norleen Lynwood ORN, MD  Semaglutide , 1 MG/DOSE, 4 MG/3ML SOPN Inject 1 mg as directed once a week. 07/15/23   Norleen Lynwood ORN, MD  solifenacin  (VESICARE ) 5 MG tablet Take 1 tablet (5 mg total) by mouth daily. 07/08/23   Norleen Lynwood ORN, MD  venlafaxine  XR (EFFEXOR -XR) 75 MG 24 hr capsule Take 3 capsules (225 mg total) by mouth daily with breakfast. 09/26/22   Norleen Lynwood ORN, MD    Family History Family History  Problem Relation Age of Onset   Hyperlipidemia Mother    Hypertension Mother    Other Mother        Inflammatory Occipital Lymphadenitis   Hypertension Father    Hyperlipidemia Father    Cirrhosis Brother        autoimmune hepatitis (twin)   Diabetes Maternal Grandmother    Heart attack Maternal Grandmother 70   Diabetes Paternal Grandmother    Heart attack Paternal Grandmother 103   Prostate cancer Paternal Grandfather    Stomach cancer Neg Hx    Esophageal cancer Neg Hx    Rectal cancer Neg Hx    Colon cancer Neg Hx     Social History Social History   Tobacco Use   Smoking status: Never   Smokeless tobacco: Never  Vaping Use   Vaping status: Never Used  Substance Use Topics   Alcohol use: No   Drug use: No     Allergies   Clomiphene citrate  and Sulfa antibiotics   Review of Systems Review of Systems  Genitourinary:        Penile concern     Physical Exam Triage Vital Signs ED Triage Vitals  Encounter Vitals Group     BP 11/11/23 1932 (!) 133/91     Girls Systolic BP Percentile --      Girls Diastolic BP Percentile --      Boys Systolic BP Percentile --      Boys Diastolic BP Percentile --      Pulse Rate 11/11/23 1932 (!) 111     Resp 11/11/23 1932 17  Temp 11/11/23 1932 99.8 F (37.7 C)     Temp Source 11/11/23 1932 Oral     SpO2 11/11/23 1932 95 %     Weight --      Height --      Head Circumference --      Peak Flow --      Pain Score 11/11/23 1930 0     Pain Loc --      Pain Education --      Exclude from Growth Chart --    No data found.  Updated Vital Signs BP (!) 133/91   Pulse (!) 111   Temp 98.7 F (37.1 C) (Oral)   Resp 17   SpO2 95%   Visual Acuity Right Eye Distance:   Left Eye Distance:   Bilateral Distance:    Right Eye Near:   Left Eye Near:    Bilateral Near:     Physical Exam Vitals and nursing note reviewed. Exam conducted with a chaperone present Editor, commissioning).  Constitutional:      General: He is not in acute distress.    Appearance: Normal appearance. He is not ill-appearing.  HENT:     Head: Normocephalic and atraumatic.  Eyes:     Pupils: Pupils are equal, round, and reactive to light.  Cardiovascular:     Rate and Rhythm: Tachycardia present.     Comments: Heart rate 111, per chart review appears to be a tachycardic multiple times in the past Pulmonary:     Effort: Pulmonary effort is normal.  Genitourinary:    Penis: Circumcised. No discharge.      Comments: On the underside of the shaft there is a white film with some mild erythema and tenderness.  No swelling.  No lesions or vesicles. Skin:    General: Skin is warm and dry.  Neurological:     General: No focal deficit present.     Mental Status: He is alert and oriented to person, place, and time.   Psychiatric:        Mood and Affect: Mood normal.        Behavior: Behavior normal.      UC Treatments / Results  Labs (all labs ordered are listed, but only abnormal results are displayed) Labs Reviewed  GLUCOSE, POCT (MANUAL RESULT ENTRY) - Abnormal; Notable for the following components:      Result Value   POC Glucose 134 (*)    All other components within normal limits    EKG   Radiology No results found.  Procedures Procedures (including critical care time)  Medications Ordered in UC Medications - No data to display  Initial Impression / Assessment and Plan / UC Course  I have reviewed the triage vital signs and the nursing notes.  Pertinent labs & imaging results that were available during my care of the patient were reviewed by me and considered in my medical decision making (see chart for details).     Reviewed exam and symptoms with patient.  No red flags.  He requested blood sugar testing, he reports he has not had anything to eat or drink in about 2 hours, blood sugar 134. Discussed candidal balanitis and will do topical clotrimazole .  Discussed keeping area clean and dry.  advised PCP follow-up in 2 to 3 days for recheck.  ER precautions reviewed Final Clinical Impressions(s) / UC Diagnoses   Final diagnoses:  Candidal balanitis     Discharge Instructions      Keep the  area clean and dry and start clotrimazole  topically twice daily.  Please follow-up with your PCP in 2 to 3 days for recheck.  Please go to the ER for any worsening symptoms.  I hope you feel better soon!     ED Prescriptions     Medication Sig Dispense Auth. Provider   clotrimazole  (LOTRIMIN ) 1 % cream Apply to affected area 2 times daily 28 g Jendayi Berling, Jodi R, NP      PDMP not reviewed this encounter.   Loreda Myla SAUNDERS, NP 11/11/23 [phone]

## 2023-11-18 ENCOUNTER — Ambulatory Visit: Payer: Self-pay | Admitting: Internal Medicine

## 2023-11-18 ENCOUNTER — Encounter: Payer: Self-pay | Admitting: Internal Medicine

## 2023-11-18 ENCOUNTER — Ambulatory Visit: Admitting: Internal Medicine

## 2023-11-18 VITALS — BP 126/82 | HR 94 | Temp 98.1°F | Ht 68.0 in | Wt 254.0 lb

## 2023-11-18 DIAGNOSIS — E7800 Pure hypercholesterolemia, unspecified: Secondary | ICD-10-CM | POA: Diagnosis not present

## 2023-11-18 DIAGNOSIS — Z23 Encounter for immunization: Secondary | ICD-10-CM

## 2023-11-18 DIAGNOSIS — Z7984 Long term (current) use of oral hypoglycemic drugs: Secondary | ICD-10-CM

## 2023-11-18 DIAGNOSIS — E1165 Type 2 diabetes mellitus with hyperglycemia: Secondary | ICD-10-CM

## 2023-11-18 DIAGNOSIS — E559 Vitamin D deficiency, unspecified: Secondary | ICD-10-CM

## 2023-11-18 DIAGNOSIS — E538 Deficiency of other specified B group vitamins: Secondary | ICD-10-CM | POA: Diagnosis not present

## 2023-11-18 LAB — BASIC METABOLIC PANEL WITH GFR
BUN: 10 mg/dL (ref 6–23)
CO2: 28 meq/L (ref 19–32)
Calcium: 9 mg/dL (ref 8.4–10.5)
Chloride: 102 meq/L (ref 96–112)
Creatinine, Ser: 0.79 mg/dL (ref 0.40–1.50)
GFR: 100.77 mL/min (ref >60.00)
Glucose, Bld: 166 mg/dL — ABNORMAL HIGH (ref 70–99)
Potassium: 3.9 meq/L (ref 3.5–5.1)
Sodium: 140 meq/L (ref 135–145)

## 2023-11-18 LAB — LIPID PANEL
Cholesterol: 127 mg/dL (ref 0–200)
HDL: 38.9 mg/dL — ABNORMAL LOW (ref >39.00)
LDL Cholesterol: 52 mg/dL (ref 0–99)
NonHDL: 87.71
Total CHOL/HDL Ratio: 3
Triglycerides: 180 mg/dL — ABNORMAL HIGH (ref 0.0–149.0)
VLDL: 36 mg/dL (ref 0.0–40.0)

## 2023-11-18 LAB — HEPATIC FUNCTION PANEL
ALT: 19 U/L (ref 0–53)
AST: 15 U/L (ref 0–37)
Albumin: 4.1 g/dL (ref 3.5–5.2)
Alkaline Phosphatase: 71 U/L (ref 39–117)
Bilirubin, Direct: 0.1 mg/dL (ref 0.0–0.3)
Total Bilirubin: 0.3 mg/dL (ref 0.2–1.2)
Total Protein: 7.1 g/dL (ref 6.0–8.3)

## 2023-11-18 LAB — HEMOGLOBIN A1C: Hgb A1c MFr Bld: 10.6 % — ABNORMAL HIGH (ref 4.6–6.5)

## 2023-11-18 LAB — VITAMIN B12: Vitamin B-12: 207 pg/mL — ABNORMAL LOW (ref 211–911)

## 2023-11-18 LAB — VITAMIN D 25 HYDROXY (VIT D DEFICIENCY, FRACTURES): VITD: 14.62 ng/mL — ABNORMAL LOW (ref 30.00–100.00)

## 2023-11-18 MED ORDER — DAPAGLIFLOZIN PROPANEDIOL 10 MG PO TABS: 10.0000 mg | ORAL_TABLET | Freq: Every day | ORAL | 3 refills | Status: AC

## 2023-11-18 MED ORDER — METFORMIN HCL ER 500 MG PO TB24: ORAL_TABLET | ORAL | 3 refills | Status: AC

## 2023-11-18 NOTE — Progress Notes (Unsigned)
 Patient ID: Gregory Owens, male   DOB: Dec 30, 1970, 53 y.o.   MRN: [id number]        Chief Complaint: follow up dm, htn, hld, low vit d       HPI:  Gregory Owens is a 53 y.o. male here overall doing well.   Pt denies chest pain, increased sob or doe, wheezing, orthopnea, PND, increased LE swelling, palpitations, dizziness or syncope.   Pt denies polydipsia, polyuria, or new focal neuro s/s.    Pt denies fever, wt loss, night sweats, loss of appetite, or other constitutional symptoms   Admits to only taking metformin  2 tab per day, but willing to go 4 per day.  Could not tolerate both GLP1 due to painful reflux worsening. Due for flu shot Wt Readings from Last 3 Encounters:  11/18/23 254 lb (115.2 kg)  08/28/23 252 lb 3.2 oz (114.4 kg)  07/15/23 247 lb (112 kg)   BP Readings from Last 3 Encounters:  11/18/23 126/82  11/11/23 (!) 133/91  08/28/23 127/84         Past Medical History:  Diagnosis Date   Abnormal EKG    NS ST-T EKG changes with negative Stress cardiolite 2003   Anxiety    Dehydration 01/30/2003   [place] , [place]   Diabetes mellitus without complication (HCC)    GERD (gastroesophageal reflux disease)    Hyperglycemia    Hyperlipidemia    IBS (irritable bowel syndrome)    Obesity    OSA (obstructive sleep apnea)    resolved post surgery   Past Surgical History:  Procedure Laterality Date   CHOLECYSTECTOMY N/A 06/07/2019   Procedure: LAPAROSCOPIC CHOLECYSTECTOMY;  Surgeon: Teresa Lonni HERO, MD;  Location: [facility] OR;  Service: General;  Laterality: N/A;   COLONOSCOPY     TONSILLECTOMY AND ADENOIDECTOMY      with above   UVULOPALATOPHARYNGOPLASTY  01/29/2002   Post op bleeding complication;  Dr Roark QUERY TOOTH EXTRACTION      reports that he has never smoked. He has never used smokeless tobacco. He reports that he does not drink alcohol and does not use drugs. family history includes Cirrhosis in his brother; Diabetes in his maternal  grandmother and paternal grandmother; Heart attack (age of onset: 26) in his maternal grandmother and paternal grandmother; Hyperlipidemia in his father and mother; Hypertension in his father and mother; Other in his mother; Prostate cancer in his paternal grandfather. Allergies  Allergen Reactions   Clomiphene Citrate     Other reaction(s): LFT elevation of  AST/ALT into 500's   Sulfa Antibiotics Other (See Comments)    Patient is not sure what reaction.    Current Outpatient Medications on File Prior to Visit  Medication Sig Dispense Refill   cetirizine (ZYRTEC) 10 MG tablet Take 10 mg by mouth daily.     clotrimazole  (LOTRIMIN ) 1 % cream Apply to affected area 2 times daily 28 g 0   Continuous Glucose Sensor (FREESTYLE LIBRE 3 SENSOR) MISC Place 1 sensor on the skin every 14 days. Use to check glucose continuously E11.9 6 each 3   guanFACINE (INTUNIV) 2 MG TB24 ER tablet Take 2 mg by mouth daily.     hydrocortisone  2.5 % cream Apply topically 2 (two) times daily. 30 g 0   ketoconazole  (NIZORAL ) 2 % shampoo Apply topically 2 (two) times a week. 120 mL 6   lisdexamfetamine (VYVANSE) 70 MG capsule Take 70 mg by mouth every morning.     losartan  (  COZAAR ) 25 MG tablet Take 1 tablet (25 mg total) by mouth daily. 90 tablet 3   Probiotic Product (ALIGN PO) Take 1 capsule by mouth daily.      rosuvastatin  (CRESTOR ) 10 MG tablet TAKE 1 TABLET BY MOUTH DAILY 90 tablet 3   solifenacin  (VESICARE ) 5 MG tablet Take 1 tablet (5 mg total) by mouth daily. 90 tablet 3   venlafaxine  XR (EFFEXOR -XR) 75 MG 24 hr capsule Take 3 capsules (225 mg total) by mouth daily with breakfast. 270 capsule 3   No current facility-administered medications on file prior to visit.        ROS:  All others reviewed and negative.  Objective        PE:  BP 126/82 (BP Location: Right Arm, Patient Position: Sitting, Cuff Size: Normal)   Pulse 94   Temp 98.1 F (36.7 C) (Oral)   Ht 5' 8 (1.727 m)   Wt 254 lb (115.2 kg)    SpO2 98%   BMI 38.62 kg/m                 Constitutional: Pt appears in NAD               HENT: Head: NCAT.                Right Ear: External ear normal.                 Left Ear: External ear normal.                Eyes: . Pupils are equal, round, and reactive to light. Conjunctivae and EOM are normal               Nose: without d/c or deformity               Neck: Neck supple. Gross normal ROM               Cardiovascular: Normal rate and regular rhythm.                 Pulmonary/Chest: Effort normal and breath sounds without rales or wheezing.                Abd:  Soft, NT, ND, + BS, no organomegaly               Neurological: Pt is alert. At baseline orientation, motor grossly intact               Skin: Skin is warm. No rashes, no other new lesions, LE edema - none               Psychiatric: Pt behavior is normal without agitation   Micro: none  Cardiac tracings I have personally interpreted today:  none  Pertinent Radiological findings (summarize): none   Lab Results  Component Value Date   WBC 7.2 03/29/2023   HGB 14.4 03/29/2023   HCT 44.3 03/29/2023   PLT 260.0 03/29/2023   GLUCOSE 166 (H) 11/18/2023   CHOL 127 11/18/2023   TRIG 180.0 (H) 11/18/2023   HDL 38.90 (L) 11/18/2023   LDLDIRECT 53.0 08/29/2017   LDLCALC 52 11/18/2023   ALT 19 11/18/2023   AST 15 11/18/2023   NA 140 11/18/2023   K 3.9 11/18/2023   CL 102 11/18/2023   CREATININE 0.79 11/18/2023   BUN 10 11/18/2023   CO2 28 11/18/2023   TSH 0.91 03/29/2023   PSA 0.48 03/29/2023   HGBA1C 10.6 (  H) 11/18/2023   MICROALBUR 1.7 03/29/2023   Assessment/Plan:  Gregory Owens is a 53 y.o. Black or African American [2] male with  has a past medical history of Abnormal EKG, Anxiety, Dehydration (01/30/2003), Diabetes mellitus without complication (HCC), GERD (gastroesophageal reflux disease), Hyperglycemia, Hyperlipidemia, IBS (irritable bowel syndrome), Obesity, and OSA (obstructive sleep  apnea).  Vitamin D  deficiency Last vitamin D  Lab Results  Component Value Date   VD25OH 14.62 (L) 11/18/2023   Low, to start oral replacement   Hyperlipidemia Lab Results  Component Value Date   LDLCALC 52 11/18/2023   Stable, pt to continue current statin crestor  10 mg qd   Diabetes mellitus with hyperglycemia (HCC) Lab Results  Component Value Date   HGBA1C 10.6 (H) 11/18/2023   Uncontrolled in part due to non compliance with meds, pt to increase metformin  ER 500 mg - 4 every day, and add farxiga 10 mg qd   B12 deficiency Lab Results  Component Value Date   VITAMINB12 207 (L) 11/18/2023   Low, to start oral replacement - b12 1000 mcg qd  Followup: Return in about 6 months (around 05/18/2024).  Lynwood Rush, MD 11/19/2023 9:33 PM [facility]

## 2023-11-18 NOTE — Patient Instructions (Signed)
 You had the flu shot today  Please remember to call for your eye exam as you mentioned  Ok to increase the metformin  to 4 pills per day  Please take all new medication as prescribed - the farxiga 10 mg per day  Please continue all other medications as before, and refills have been done if requested.  Please have the pharmacy call with any other refills you may need.  Please continue your efforts at being more active, low cholesterol diet, and weight control.  Please keep your appointments with your specialists as you may have planned  Please go to the LAB at the blood drawing area for the tests to be done  You will be contacted by phone if any changes need to be made immediately.  Otherwise, you will receive a letter about your results with an explanation, but please check with MyChart first.  Please make an Appointment to return in 6 months, or sooner if needed

## 2023-11-19 ENCOUNTER — Encounter: Payer: Self-pay | Admitting: Internal Medicine

## 2023-11-19 NOTE — Assessment & Plan Note (Signed)
 Lab Results  Component Value Date   VITAMINB12 207 (L) 11/18/2023   Low, to start oral replacement - b12 1000 mcg qd

## 2023-11-19 NOTE — Assessment & Plan Note (Signed)
 Lab Results  Component Value Date   LDLCALC 52 11/18/2023   Stable, pt to continue current statin crestor  10 mg qd

## 2023-11-19 NOTE — Assessment & Plan Note (Signed)
 Lab Results  Component Value Date   HGBA1C 10.6 (H) 11/18/2023   Uncontrolled in part due to non compliance with meds, pt to increase metformin  ER 500 mg - 4 every day, and add farxiga 10 mg qd

## 2023-11-19 NOTE — Assessment & Plan Note (Signed)
 Last vitamin D  Lab Results  Component Value Date   VD25OH 14.62 (L) 11/18/2023   Low, to start oral replacement

## 2023-12-02 IMAGING — CT CT ABD-PELV W/ CM
1 of 3 series · 14 of 32 positions shown, 19 images · IV contrast (APPLIED)
Comparison: 06/06/2019

CLINICAL DATA: Upper abdominal, epigastric pain.

EXAM:
CT ABDOMEN AND PELVIS WITH CONTRAST
TECHNIQUE: Multidetector CT imaging of the abdomen and pelvis was performed
using the standard protocol following bolus administration of
intravenous contrast.

[Series 2: abd/pelvis w/cm · axial · 0.87mm/px · z∈[-521,-81]mm · 14 of 102 slices shown, 19 images]
[im 7/102  soft-tissue]
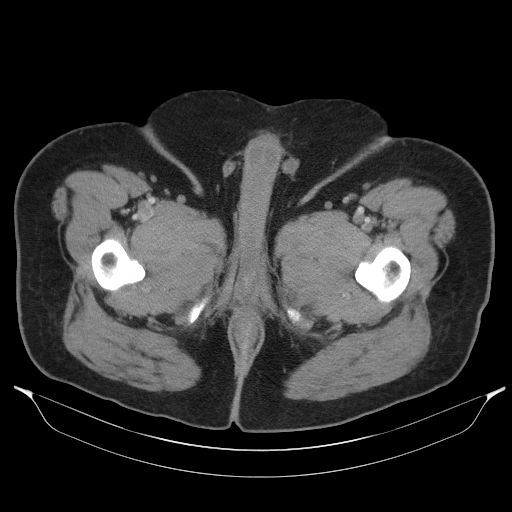
[im 7/102  bone]
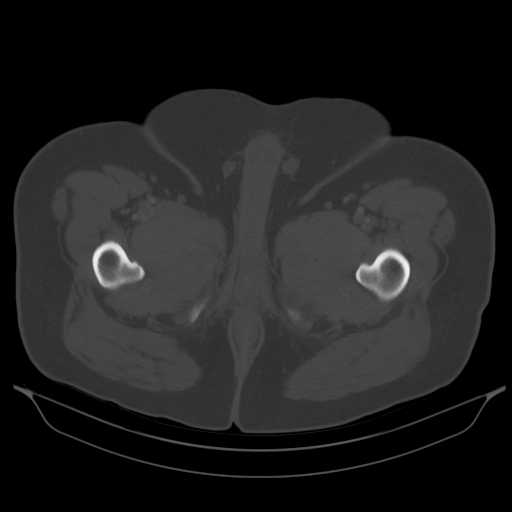
[im 13/102  soft-tissue]
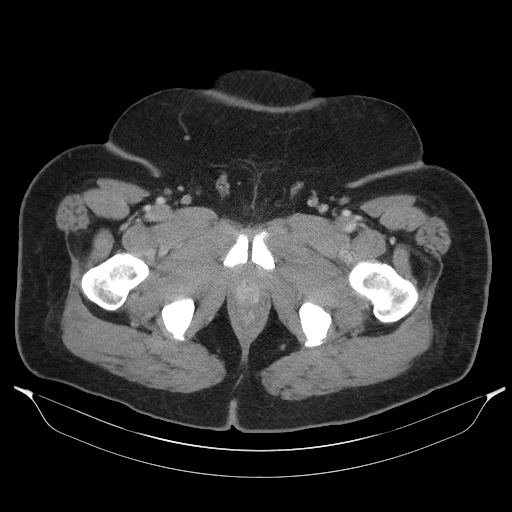
[im 19/102  soft-tissue]
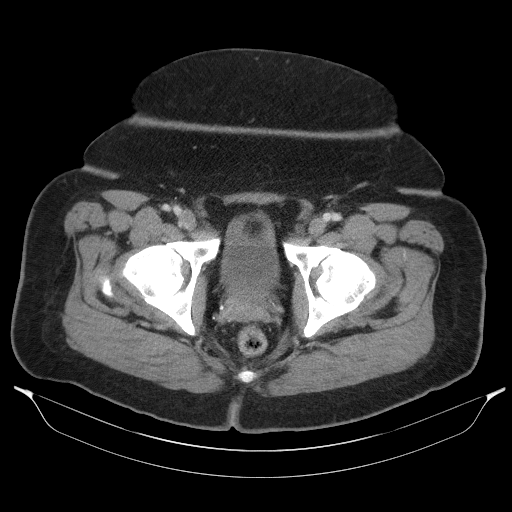
[im 32/102  soft-tissue]
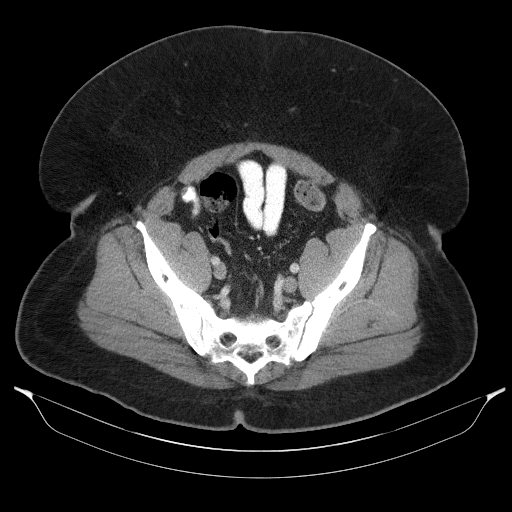
[im 38/102  soft-tissue]
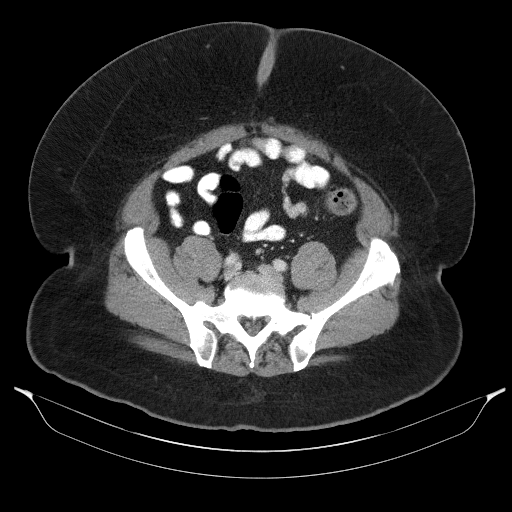
[im 45/102  soft-tissue]
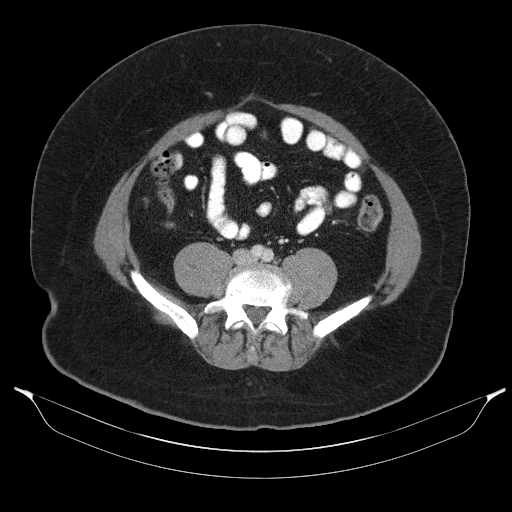
[im 51/102  soft-tissue]
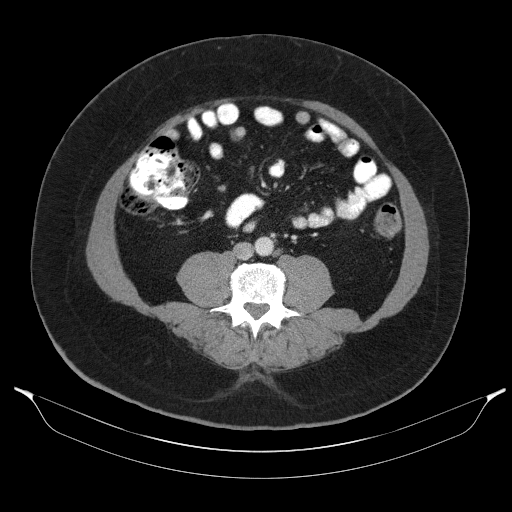
[im 57/102  soft-tissue]
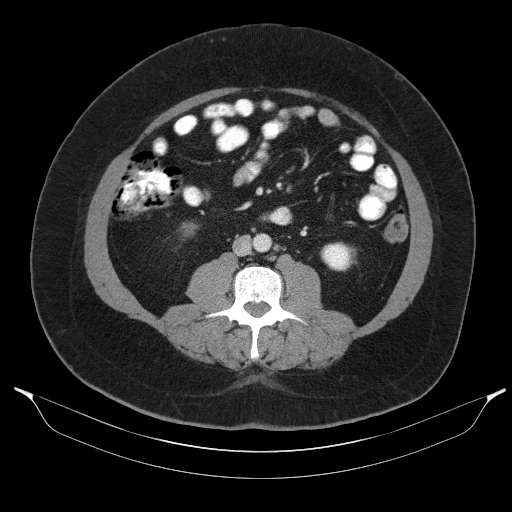
[im 64/102  soft-tissue]
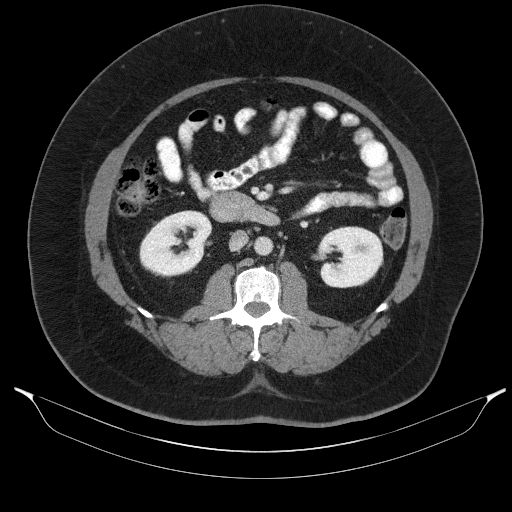
[im 64/102  bone]
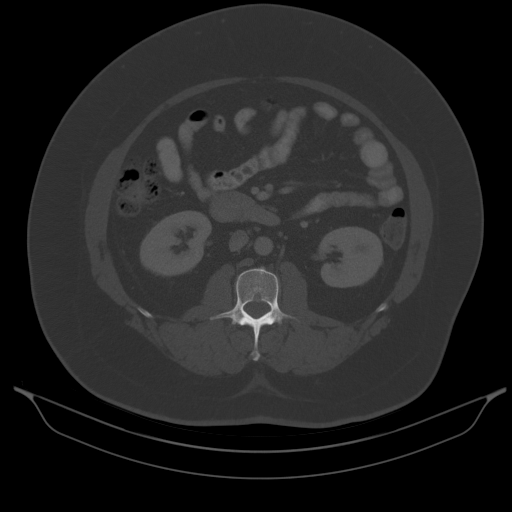
[im 70/102  soft-tissue]
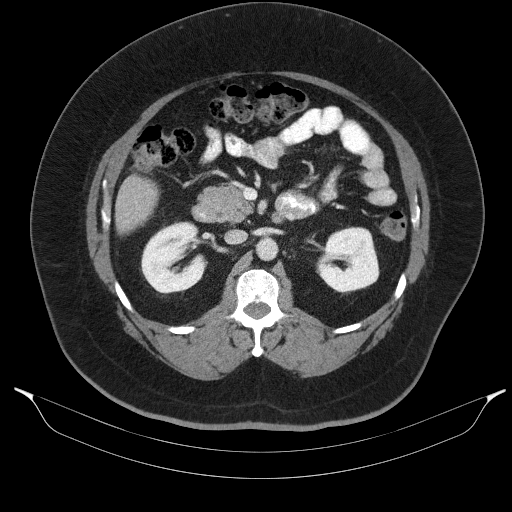
[im 76/102  lung]
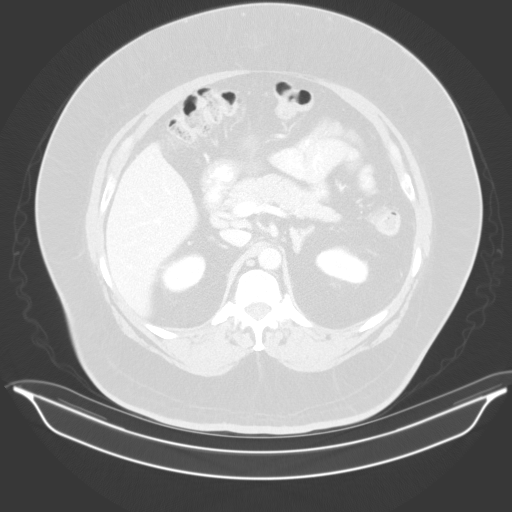
[im 83/102  soft-tissue]
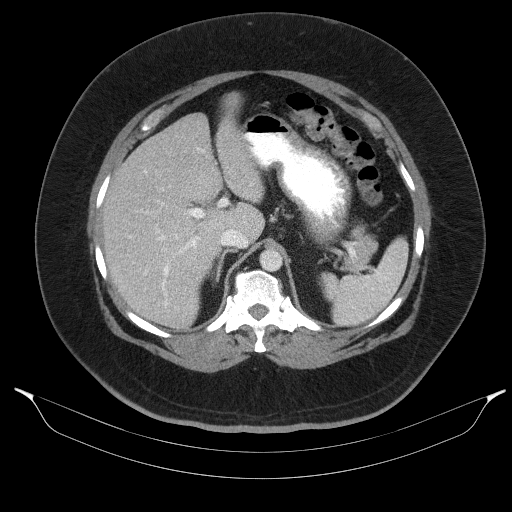
[im 83/102  lung]
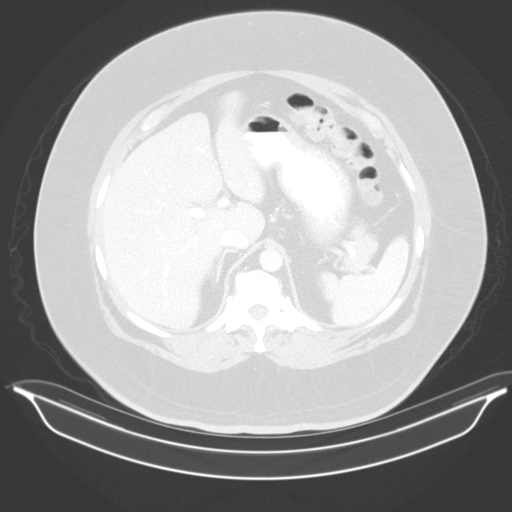
[im 89/102  soft-tissue]
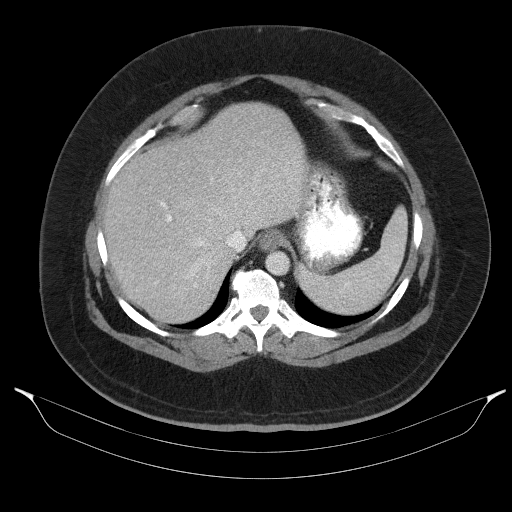
[im 89/102  lung]
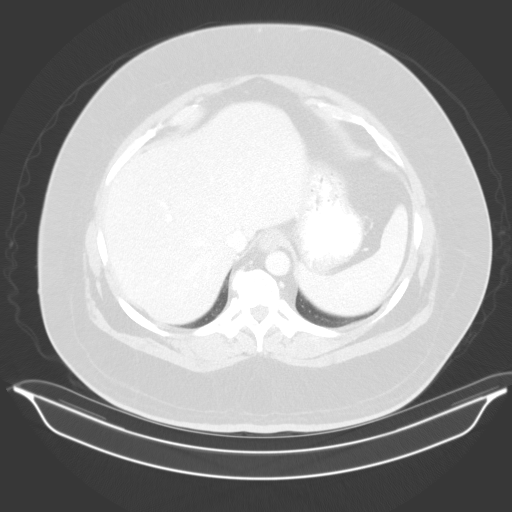
[im 95/102  soft-tissue]
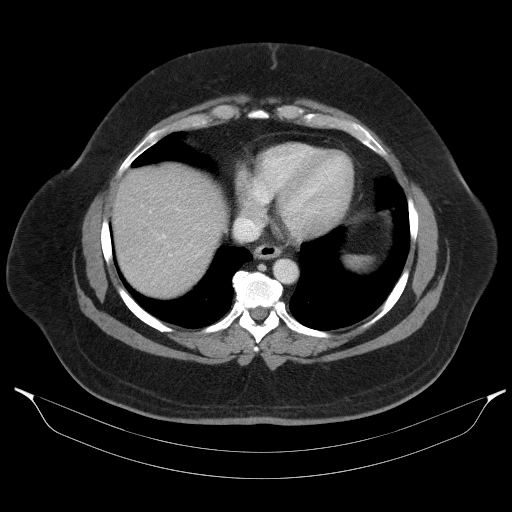
[im 95/102  lung]
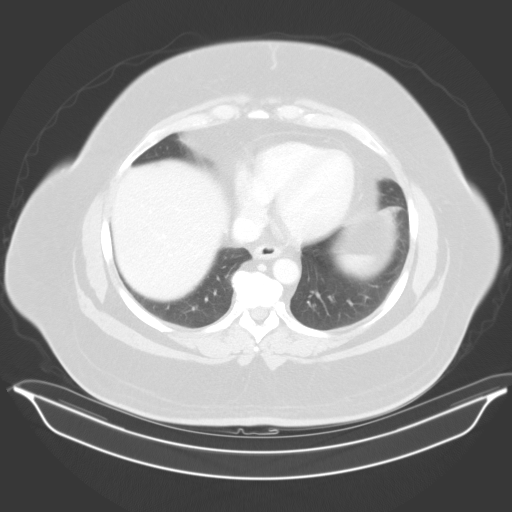

[14 of 32 positions shown; findings below may reference images not displayed]

RADIATION DOSE REDUCTION: This exam was performed according to the
departmental dose-optimization program which includes automated
exposure control, adjustment of the mA and/or kV according to
patient size and/or use of iterative reconstruction technique.

CONTRAST:  125mL [id number] IOPAMIDOL ([id number]) INJECTION 61%
FINDINGS: Lower chest: No acute abnormality

Hepatobiliary: No focal liver abnormality is seen. Status post
cholecystectomy. No biliary dilatation.

Pancreas: No focal abnormality or ductal dilatation.

Spleen: No focal abnormality.  Normal size.

Adrenals/Urinary Tract: No adrenal abnormality. No focal renal
abnormality. No stones or hydronephrosis. Urinary bladder is
unremarkable.

Stomach/Bowel: Stomach, large and small bowel grossly unremarkable.

Vascular/Lymphatic: No evidence of aneurysm or adenopathy.

Reproductive: No visible focal abnormality.

Other: No free fluid or free air. Small umbilical hernia containing
fat.

Musculoskeletal: No acute bony abnormality.
IMPRESSION: No acute findings in the abdomen or pelvis.

Small umbilical hernia containing fat.

## 2023-12-05 LAB — OPHTHALMOLOGY REPORT-SCANNED

## 2023-12-30 ENCOUNTER — Other Ambulatory Visit: Payer: Self-pay

## 2023-12-30 ENCOUNTER — Ambulatory Visit: Admitting: Family Medicine

## 2023-12-30 VITALS — BP 118/80 | HR 95 | Ht 68.0 in | Wt 250.0 lb

## 2023-12-30 DIAGNOSIS — E1165 Type 2 diabetes mellitus with hyperglycemia: Secondary | ICD-10-CM

## 2023-12-30 DIAGNOSIS — M1711 Unilateral primary osteoarthritis, right knee: Secondary | ICD-10-CM

## 2023-12-30 DIAGNOSIS — G8929 Other chronic pain: Secondary | ICD-10-CM

## 2023-12-30 DIAGNOSIS — M65311 Trigger thumb, right thumb: Secondary | ICD-10-CM | POA: Insufficient documentation

## 2023-12-30 NOTE — Patient Instructions (Addendum)
 Thank you for coming in today.   Double Band-aid splint  You received an injection today. Seek immediate medical attention if the joint becomes red, extremely painful, or is oozing fluid.   Please use Voltaren gel (Generic Diclofenac Gel) up to 4x daily for pain as needed.  This is available over-the-counter as both the name brand Voltaren gel and the generic diclofenac gel.   Check back in about a week for trigger thumb steroid injection.

## 2023-12-30 NOTE — Progress Notes (Signed)
   LILLETTE Ileana Collet, PhD, LAT, ATC acting as a scribe for Artist Lloyd, MD.  Gregory Owens is a 53 y.o. male who presents to [facility] at [facility] today for exacerbation of his R knee pain. Pt was last seen by Dr. Lloyd on 06/27/23 and was given a repeat R knee steroid injection.  Today, pt reports R knee pain returned about 3-wks ago. He is wondering if lifting his 2-yr old daughter up on his lower leg may have exacerbated his knee pain.  Pt also c/o R thumb pain x 2-wks. He is RHD. Pt locates pain to the IP joint and MCP joint of the R thumb.  Dx imaging: 01/03/22 R knee XR   Pertinent review of systems: No fevers or chills  Relevant historical information: Not well-controlled diabetes.  A1c was over 10 in October   Exam:  BP 118/80   Pulse 95   Ht 5' 8 (1.727 m)   Wt 250 lb (113.4 kg)   SpO2 95%   BMI 38.01 kg/m  General: Well Developed, well nourished, and in no acute distress.   MSK: Right thumb normal appearing Nontender to palpation. Triggering present with flexion of right thumb IP joint.  Right knee moderate joint effusion normal-appearing otherwise normal motion.    Lab and Radiology Results  Procedure: Real-time Ultrasound Guided Injection of right knee joint superior lateral patella space Device: Philips Affiniti 50G/GE Logiq Images permanently stored and available for review in PACS Verbal informed consent obtained.  Discussed risks and benefits of procedure. Warned about infection, bleeding, hyperglycemia damage to structures among others. Patient expresses understanding and agreement Time-out conducted.   Noted no overlying erythema, induration, or other signs of local infection.   Skin prepped in a sterile fashion.   Local anesthesia: Topical Ethyl chloride.   With sterile technique and under real time ultrasound guidance: 40 mg of Kenalog and 2 mL of Marcaine  injected into knee joint. Fluid seen entering the joint capsule.    Completed without difficulty   Pain immediately resolved suggesting accurate placement of the medication.   Advised to call if fevers/chills, erythema, induration, drainage, or persistent bleeding.   Images permanently stored and available for review in the ultrasound unit.  Impression: Technically successful ultrasound guided injection.        Assessment and Plan: 53 y.o. male with right knee pain due to exacerbation of DJD.  Plan for steroid injection today.  Return in about a week for potential trigger finger injection right thumb.  Double Band-Aid splint and Voltaren gel reviewed.  Decided to avoid 2 injections in 1 day.  He has a diabetes with an A1c over 10.  2 injections in 1 day would cause significant hyperglycemia would be a bad idea   PDMP not reviewed this encounter. Orders Placed This Encounter  Procedures   US  LIMITED JOINT SPACE STRUCTURES LOW RIGHT(NO LINKED CHARGES)    Reason for Exam (SYMPTOM  OR DIAGNOSIS REQUIRED):   right knee pain    Preferred imaging location?:   [facility]   No orders of the defined types were placed in this encounter.    Discussed warning signs or symptoms. Please see discharge instructions. Patient expresses understanding.   The above documentation has been reviewed and is accurate and complete Artist Lloyd, M.D.

## 2024-01-08 ENCOUNTER — Ambulatory Visit: Admitting: Family Medicine

## 2024-01-08 ENCOUNTER — Other Ambulatory Visit: Payer: Self-pay

## 2024-01-08 VITALS — BP 118/80 | HR 95 | Ht 68.0 in | Wt 250.0 lb

## 2024-01-08 DIAGNOSIS — M65311 Trigger thumb, right thumb: Secondary | ICD-10-CM

## 2024-01-08 NOTE — Patient Instructions (Addendum)
 Thank you for coming in today.   You received an injection today. Seek immediate medical attention if the joint becomes red, extremely painful, or is oozing fluid.

## 2024-01-08 NOTE — Progress Notes (Signed)
 Patient presents to clinic today for previously arranged right trigger thumb injection.  Procedure: Real-time Ultrasound Guided Injection of right thumb MCP tendon sheath at A1 pulley.  Trigger finger injection Device: Philips Affiniti 50G/GE Logiq Images permanently stored and available for review in PACS Verbal informed consent obtained.  Discussed risks and benefits of procedure. Warned about infection, bleeding, hyperglycemia damage to structures among others. Patient expresses understanding and agreement Time-out conducted.   Noted no overlying erythema, induration, or other signs of local infection.   Skin prepped in a sterile fashion.   Local anesthesia: Topical Ethyl chloride.   With sterile technique and under real time ultrasound guidance: 40 mg of Kenalog and 1 mL of lidocaine  injected into A1 pulley tendon sheath. Fluid seen entering the tendon sheath.   Completed without difficulty   Pain immediately resolved suggesting accurate placement of the medication.   Advised to call if fevers/chills, erythema, induration, drainage, or persistent bleeding.   Images permanently stored and available for review in the ultrasound unit.  Impression: Technically successful ultrasound guided injection.

## 2024-02-24 ENCOUNTER — Other Ambulatory Visit (HOSPITAL_COMMUNITY): Payer: Self-pay
